# Patient Record
Sex: Male | Born: 1965
Health system: Southern US, Community
[De-identification: ages and names within clinical notes are randomized; demographics above are authoritative.]

## PROBLEM LIST (undated history)

## (undated) DIAGNOSIS — F419 Anxiety disorder, unspecified: Secondary | ICD-10-CM

## (undated) DIAGNOSIS — K219 Gastro-esophageal reflux disease without esophagitis: Secondary | ICD-10-CM

## (undated) DIAGNOSIS — J40 Bronchitis, not specified as acute or chronic: Secondary | ICD-10-CM

## (undated) DIAGNOSIS — M1990 Unspecified osteoarthritis, unspecified site: Secondary | ICD-10-CM

## (undated) DIAGNOSIS — R7989 Other specified abnormal findings of blood chemistry: Secondary | ICD-10-CM

## (undated) DIAGNOSIS — G4700 Insomnia, unspecified: Secondary | ICD-10-CM

## (undated) DIAGNOSIS — I10 Essential (primary) hypertension: Secondary | ICD-10-CM

## (undated) HISTORY — DX: Unspecified osteoarthritis, unspecified site: M19.90

## (undated) HISTORY — DX: Gastro-esophageal reflux disease without esophagitis: K21.9

## (undated) HISTORY — DX: Essential (primary) hypertension: I10

## (undated) HISTORY — PX: WRIST SURGERY: SHX841

## (undated) HISTORY — DX: Insomnia, unspecified: G47.00

## (undated) HISTORY — PX: NASAL SINUS SURGERY: SHX719

## (undated) HISTORY — PX: OTHER SURGICAL HISTORY: SHX169

## (undated) HISTORY — DX: Anxiety disorder, unspecified: F41.9

## (undated) HISTORY — DX: Other specified abnormal findings of blood chemistry: R79.89

## (undated) HISTORY — DX: Bronchitis, not specified as acute or chronic: J40

---

## 2004-01-08 ENCOUNTER — Emergency Department: Payer: Self-pay | Admitting: General Practice

## 2004-09-08 ENCOUNTER — Emergency Department: Payer: Self-pay | Admitting: General Practice

## 2008-07-07 ENCOUNTER — Ambulatory Visit: Payer: Self-pay | Admitting: Oncology

## 2009-08-16 ENCOUNTER — Ambulatory Visit: Payer: Self-pay | Admitting: Otolaryngology

## 2011-09-24 ENCOUNTER — Encounter: Payer: Self-pay | Admitting: Internal Medicine

## 2011-09-24 ENCOUNTER — Ambulatory Visit (INDEPENDENT_AMBULATORY_CARE_PROVIDER_SITE_OTHER): Payer: PRIVATE HEALTH INSURANCE | Admitting: Internal Medicine

## 2011-09-24 ENCOUNTER — Ambulatory Visit: Payer: Self-pay | Admitting: Internal Medicine

## 2011-09-24 ENCOUNTER — Ambulatory Visit (INDEPENDENT_AMBULATORY_CARE_PROVIDER_SITE_OTHER)
Admission: RE | Admit: 2011-09-24 | Discharge: 2011-09-24 | Disposition: A | Discharge status: Home / Self Care | Payer: PRIVATE HEALTH INSURANCE | Source: Ambulatory Visit | Attending: Internal Medicine | Admitting: Internal Medicine

## 2011-09-24 ENCOUNTER — Ambulatory Visit: Payer: Self-pay

## 2011-09-24 ENCOUNTER — Ambulatory Visit: Admission: RE | Admit: 2011-09-24 | Payer: Self-pay | Source: Ambulatory Visit

## 2011-09-24 ENCOUNTER — Telehealth: Payer: Self-pay | Admitting: *Deleted

## 2011-09-24 VITALS — BP 124/80 | HR 68

## 2011-09-24 VITALS — BP 124/80 | HR 68 | Temp 98.6°F | Resp 16 | Ht 71.0 in | Wt 200.5 lb

## 2011-09-24 DIAGNOSIS — M25559 Pain in unspecified hip: Secondary | ICD-10-CM

## 2011-09-24 DIAGNOSIS — M7712 Lateral epicondylitis, left elbow: Secondary | ICD-10-CM | POA: Insufficient documentation

## 2011-09-24 DIAGNOSIS — M25551 Pain in right hip: Secondary | ICD-10-CM

## 2011-09-24 DIAGNOSIS — F411 Generalized anxiety disorder: Secondary | ICD-10-CM

## 2011-09-24 DIAGNOSIS — M545 Low back pain, unspecified: Secondary | ICD-10-CM

## 2011-09-24 DIAGNOSIS — K219 Gastro-esophageal reflux disease without esophagitis: Secondary | ICD-10-CM | POA: Insufficient documentation

## 2011-09-24 DIAGNOSIS — N529 Male erectile dysfunction, unspecified: Secondary | ICD-10-CM | POA: Insufficient documentation

## 2011-09-24 DIAGNOSIS — F419 Anxiety disorder, unspecified: Secondary | ICD-10-CM | POA: Insufficient documentation

## 2011-09-24 DIAGNOSIS — I10 Essential (primary) hypertension: Secondary | ICD-10-CM | POA: Insufficient documentation

## 2011-09-24 MED ORDER — PREDNISONE (PAK) 10 MG PO TABS: ORAL_TABLET | ORAL | Status: DC

## 2011-09-24 NOTE — Progress Notes (Signed)
Subjective:    Patient ID: Gregory Bame Sr., male    DOB: January 07, 1966, 46 y.o.   MRN: [id number]  HPI 46 year old male with history of hypertension, GERD, and anxiety presents to establish care. His primary concern today is several week history of right lower back pain. The pain is located in his right medial lower back. It does not radiate. It is described as aching. It is most prominent at night and keeps him from sleeping. He has been taking ibuprofen 800 mg daily with some improvement. He notes that symptoms first began when he was seated in a new chair in his vehicle for work. He has since been getting a new chair but has not yet noted any improvement. He was seen by a chiropractor and underwent treatments with no improvement. He denies any weakness in his leg or numbness in his leg.  He is also concerned today about pain in his left lateral elbow. He reports that this pain occurred after repetitive motion at work. He has some improvement with use of ibuprofen. The pain is described as aching it does not radiate. There is no swelling in his joints or weakness in his left arm.  In regards to history of hypertension and GERD, he reports symptoms are well controlled with medication. He reports full compliance with his medicines. In regards to her anxiety, he reports that he uses alprazolam as needed at bedtime to help with sleep and anxiety. He denies any side effects from this medication.  Outpatient Encounter Prescriptions as of 09/24/2011  Medication Sig Dispense Refill  . ALPRAZolam (XANAX) 0.25 MG tablet Take 0.25 mg by mouth at bedtime as needed.      Marland Kitchen aspirin 81 MG tablet Take 81 mg by mouth daily.      Marland Kitchen lisinopril (PRINIVIL,ZESTRIL) 5 MG tablet Take 5 mg by mouth daily.      . Multiple Vitamin (MULTIVITAMIN) tablet Take 1 tablet by mouth daily.      Marland Kitchen omeprazole (PRILOSEC) 40 MG capsule Take 40 mg by mouth daily.      . predniSONE (STERAPRED UNI-PAK) 10 MG tablet Take 60mg  day 1 then  taper by 10mg  daily  21 tablet  0  . sildenafil (VIAGRA) 100 MG tablet Take 50 mg by mouth daily as needed.      . testosterone cypionate (DEPOTESTOTERONE CYPIONATE) 100 MG/ML injection Inject 100 mg into the muscle every 14 (fourteen) days. For IM use only       There were no vitals taken for this visit.  Review of Systems  Constitutional: Negative for fever, chills, activity change, appetite change, fatigue and unexpected weight change.  Eyes: Negative for visual disturbance.  Respiratory: Negative for cough and shortness of breath.   Cardiovascular: Negative for chest pain, palpitations and leg swelling.  Gastrointestinal: Negative for abdominal pain and abdominal distention.  Genitourinary: Negative for dysuria, urgency and difficulty urinating.  Musculoskeletal: Positive for myalgias, back pain and arthralgias. Negative for gait problem.  Skin: Negative for color change and rash.  Hematological: Negative for adenopathy.  Psychiatric/Behavioral: Positive for disturbed wake/sleep cycle. Negative for dysphoric mood. The patient is nervous/anxious.        Objective:   Physical Exam  Constitutional: He is oriented to person, place, and time. He appears well-developed and well-nourished. No distress.  HENT:  Head: Normocephalic and atraumatic.  Right Ear: External ear normal.  Left Ear: External ear normal.  Nose: Nose normal.  Mouth/Throat: Oropharynx is clear and moist. No oropharyngeal exudate.  Eyes: Conjunctivae normal and EOM are normal. Pupils are equal, round, and reactive to light. Right eye exhibits no discharge. Left eye exhibits no discharge. No scleral icterus.  Neck: Normal range of motion. Neck supple. No tracheal deviation present. No thyromegaly present.  Cardiovascular: Normal rate, regular rhythm and normal heart sounds.  Exam reveals no gallop and no friction rub.   No murmur heard. Pulmonary/Chest: Effort normal and breath sounds normal. No respiratory distress.  He has no wheezes. He has no rales. He exhibits no tenderness.  Musculoskeletal: Normal range of motion. He exhibits no edema.       Left elbow: He exhibits normal range of motion, no swelling, no effusion and no deformity. tenderness found. Lateral epicondyle tenderness noted.       Lumbar back: He exhibits tenderness and pain. He exhibits no swelling, no edema and no deformity.       Back:  Lymphadenopathy:    He has no cervical adenopathy.  Neurological: He is alert and oriented to person, place, and time. No cranial nerve deficit. Coordination normal.  Skin: Skin is warm and dry. No rash noted. He is not diaphoretic. No erythema. No pallor.  Psychiatric: He has a normal mood and affect. His behavior is normal. Judgment and thought content normal.          Assessment & Plan:

## 2011-09-24 NOTE — Assessment & Plan Note (Signed)
Symptoms well controlled with Xanax as needed. Will continue.

## 2011-09-24 NOTE — Assessment & Plan Note (Signed)
Symptoms and exam most consistent with compression of the sciatic nerve. Will get plain xray of right hip and lumbar spine given potential trauma with seat at work. Will start prednisone taper. If no improvement, would favor referral to sports med for steroid injection and PT. Follow up 3 weeks.

## 2011-09-24 NOTE — Telephone Encounter (Signed)
Pt informed, understood & agreed; Ok to set up MRI/SLS

## 2011-09-24 NOTE — Telephone Encounter (Signed)
Message copied by Regis Bill on Tue Sep 24, 2011  3:10 PM ------      Message from: Ronna Polio A      Created: Tue Sep 24, 2011 12:07 PM       Xray of lumbar spine showed degenerative changes at L4-5 right>left. I would recommend getting MRI lumbar spine for further evaluation.

## 2011-09-24 NOTE — Assessment & Plan Note (Signed)
Symptoms well controlled with viagra. Will continue.

## 2011-09-24 NOTE — Assessment & Plan Note (Signed)
Symptoms and exam consistent with left lateral epicondylitis. Will treat with prednisone taper. If no improvement, would favor referral to sports medicine for evaluation and possible steroid injection.

## 2011-09-24 NOTE — Assessment & Plan Note (Signed)
Symptoms well controlled with omeprazole. Will continue. 

## 2011-09-24 NOTE — Assessment & Plan Note (Signed)
BP well controlled on Lisinopril. Will continue. Will get records on recent renal function from labs drawn by pt employer.

## 2011-09-25 NOTE — Progress Notes (Signed)
  Subjective:    Patient ID: Gregory Bame Sr., male    DOB: March 05, 1965, 46 y.o.   MRN: [id number]  HPI Visit entered twice    Review of Systems     Objective:   Physical Exam        Assessment & Plan:

## 2011-09-26 ENCOUNTER — Encounter: Payer: Self-pay | Admitting: Internal Medicine

## 2011-10-09 ENCOUNTER — Telehealth: Payer: Self-pay | Admitting: Internal Medicine

## 2011-10-09 NOTE — Telephone Encounter (Signed)
Pt called checking to see when his mri will be set up Pt would like to go to [place]

## 2011-10-11 NOTE — Telephone Encounter (Signed)
I will have Erie Noe call patient to set up MRI.

## 2011-10-21 ENCOUNTER — Telehealth: Payer: Self-pay | Admitting: Internal Medicine

## 2011-10-21 NOTE — Telephone Encounter (Signed)
Caller: Burnis/Patient; Phone: [phone]; Reason for Call: Patient calling to verify if insurance company has responded regarding approval for MRI.

## 2011-10-23 NOTE — Telephone Encounter (Signed)
Caller: [name]/Patient; Patient Name: Gregory Stokes; PCP: Ronna Polio (Adults only); Best Callback Phone Number: [phone]. Patient is calling to check on his referral for the MRI that Dr. Dan Humphreys was supposed to have scheduled. He has spoken with several staff members regarding this and has not received phone calls back as requested. Reviewed referral information in EPIC and the referral for MRI is still pending. Patient reports that he will check with his insurance company to try and get this referral approved. Patient reports his job makes it difficult to schedule appointment at times. Patient reports he will keep his current appointment with Dr. Dan Humphreys which is scheduled for 10/24/11 at 8:45am. PLEASE CALL PATIENT BACK REGARDING THE REFERRAL FOR SCHEDULING AN MRI. Thank you.

## 2011-10-24 ENCOUNTER — Ambulatory Visit (INDEPENDENT_AMBULATORY_CARE_PROVIDER_SITE_OTHER): Payer: PRIVATE HEALTH INSURANCE | Admitting: Internal Medicine

## 2011-10-24 ENCOUNTER — Encounter: Payer: Self-pay | Admitting: Internal Medicine

## 2011-10-24 VITALS — BP 120/80 | HR 68 | Temp 98.7°F | Ht 71.0 in | Wt 196.5 lb

## 2011-10-24 DIAGNOSIS — I10 Essential (primary) hypertension: Secondary | ICD-10-CM

## 2011-10-24 DIAGNOSIS — K219 Gastro-esophageal reflux disease without esophagitis: Secondary | ICD-10-CM

## 2011-10-24 DIAGNOSIS — M25551 Pain in right hip: Secondary | ICD-10-CM

## 2011-10-24 DIAGNOSIS — E291 Testicular hypofunction: Secondary | ICD-10-CM

## 2011-10-24 DIAGNOSIS — M25559 Pain in unspecified hip: Secondary | ICD-10-CM

## 2011-10-24 MED ORDER — MELOXICAM 15 MG PO TABS: 15.0000 mg | ORAL_TABLET | Freq: Every day | ORAL | Status: DC

## 2011-10-24 NOTE — Addendum Note (Signed)
Addended by: Ronna Polio A on: 10/24/2011 01:21 PM   Modules accepted: Orders

## 2011-10-24 NOTE — Assessment & Plan Note (Signed)
Symptoms are persistent. Patient has been using ibuprofen with minimal improvement. Will try changing to meloxicam. MRI of the lumbar spine is pending. Followup here in 4 weeks or sooner as needed.

## 2011-10-24 NOTE — Assessment & Plan Note (Signed)
Blood pressures have been running low on lisinopril. Patient would like to give a try off this medication. He will stop medication for 2 days and monitor blood pressure carefully. Blood pressure continues to be well-controlled he will remain off the medication. He will e-mail or call if blood pressure readings.

## 2011-10-24 NOTE — Progress Notes (Signed)
Subjective:    Patient ID: Gregory Bame Sr., male    DOB: July 13, 1965, 46 y.o.   MRN: [id number]  HPI 46 year old male with history of hypertension, GERD, and right lower back pain presents for followup. At his last visit, he had plain x-rays of the lumbar spine which showed asymmetry of the facets at L4 and L5. He was started on prednisone taper and reports that symptoms of low back pain briefly improved with this. However, symptoms have recurred. He has aching, severe low back pain located in the right lower back and hip area on a daily basis. He has difficulty with movement because of pain. He has been taking ibuprofen in the mornings with minimal improvement in his symptoms.  In regards to GERD, he reports that over the last few weeks he has had a recent exacerbation of his symptoms with increased burning in his upper epigastric area and sore throat. He reports that this seems to be improving over the last couple of days. He reports full compliance with his omeprazole. He does typically eat foods which are spicy.  In regards to hypertension, he reports that he would like to stop lisinopril. He reports blood pressure has been well-controlled and he would like a trial off this medication.  Outpatient Encounter Prescriptions as of 10/24/2011  Medication Sig Dispense Refill  . ALPRAZolam (XANAX) 0.25 MG tablet Take 0.25 mg by mouth at bedtime as needed.      Marland Kitchen aspirin 81 MG tablet Take 81 mg by mouth daily.      Marland Kitchen lisinopril (PRINIVIL,ZESTRIL) 5 MG tablet Take 5 mg by mouth daily.      . Multiple Vitamin (MULTIVITAMIN) tablet Take 1 tablet by mouth daily.      Marland Kitchen omeprazole (PRILOSEC) 40 MG capsule Take 40 mg by mouth daily.      Marland Kitchen testosterone cypionate (DEPOTESTOTERONE CYPIONATE) 100 MG/ML injection Inject 100 mg into the muscle every 14 (fourteen) days. For IM use only      . DISCONTD: predniSONE (STERAPRED UNI-PAK) 10 MG tablet Take 60mg  day 1 then taper by 10mg  daily  21 tablet  0  .  meloxicam (MOBIC) 15 MG tablet Take 1 tablet (15 mg total) by mouth daily.  30 tablet  6  . sildenafil (VIAGRA) 100 MG tablet Take 50 mg by mouth daily as needed.       BP 120/80  Pulse 68  Temp 98.7 F (37.1 C) (Oral)  Ht 5\' 11"  (1.803 m)  Wt 196 lb 8 oz (89.132 kg)  BMI 27.41 kg/m2  SpO2 98%  Review of Systems  Constitutional: Negative for fever, chills, activity change, appetite change, fatigue and unexpected weight change.  Eyes: Negative for visual disturbance.  Respiratory: Negative for cough and shortness of breath.   Cardiovascular: Negative for chest pain, palpitations and leg swelling.  Gastrointestinal: Positive for abdominal pain. Negative for abdominal distention.  Genitourinary: Negative for dysuria, urgency and difficulty urinating.  Musculoskeletal: Positive for myalgias and arthralgias. Negative for gait problem.  Skin: Negative for color change and rash.  Hematological: Negative for adenopathy.  Psychiatric/Behavioral: Negative for disturbed wake/sleep cycle and dysphoric mood. The patient is not nervous/anxious.        Objective:   Physical Exam  Constitutional: He is oriented to person, place, and time. He appears well-developed and well-nourished. No distress.  HENT:  Head: Normocephalic and atraumatic.  Right Ear: External ear normal.  Left Ear: External ear normal.  Nose: Nose normal.  Mouth/Throat: Oropharynx is  clear and moist. No oropharyngeal exudate.  Eyes: Conjunctivae normal and EOM are normal. Pupils are equal, round, and reactive to light. Right eye exhibits no discharge. Left eye exhibits no discharge. No scleral icterus.  Neck: Normal range of motion. Neck supple. No tracheal deviation present. No thyromegaly present.  Cardiovascular: Normal rate, regular rhythm and normal heart sounds.  Exam reveals no gallop and no friction rub.   No murmur heard. Pulmonary/Chest: Effort normal and breath sounds normal. No respiratory distress. He has no  wheezes. He has no rales. He exhibits no tenderness.  Musculoskeletal: He exhibits no edema.       Lumbar back: He exhibits decreased range of motion and pain.  Lymphadenopathy:    He has no cervical adenopathy.  Neurological: He is alert and oriented to person, place, and time. No cranial nerve deficit. Coordination normal.  Skin: Skin is warm and dry. No rash noted. He is not diaphoretic. No erythema. No pallor.  Psychiatric: He has a normal mood and affect. His behavior is normal. Judgment and thought content normal.          Assessment & Plan:

## 2011-10-24 NOTE — Assessment & Plan Note (Signed)
Patient has been on testosterone supplementation through his urologist. He reports no improvement in symptoms of fatigue or low libido with use of testosterone supplementation. He would like to stop supplementation. He will discuss with his urologist.

## 2011-10-24 NOTE — Assessment & Plan Note (Signed)
Recent episode of worsening symptoms. Per patient, seems to be improving. Will try changing to Dexilant for the next 2 weeks to see if any benefit. If symptoms are persisting, would favor getting testing for H. pylori. Followup 4 weeks.

## 2011-10-25 ENCOUNTER — Encounter: Payer: Self-pay | Admitting: Internal Medicine

## 2011-12-10 ENCOUNTER — Telehealth: Payer: Self-pay | Admitting: Internal Medicine

## 2011-12-10 NOTE — Telephone Encounter (Signed)
Erie Noe, I do believe that you were working on this a while back, can you please help Dorene Sorrow. Thanks

## 2011-12-10 NOTE — Telephone Encounter (Signed)
[facility] [phone]) is calling about Auth for MRI. They were trying to get authorization but [employer] is saying that we already have an authorization for the procedure. I did not see an auth number in the referral. [employer] will not give them the number but wanted [place] to call and get the auth number.  The Reference number for the MRI is [id number].

## 2011-12-11 ENCOUNTER — Telehealth: Payer: Self-pay | Admitting: Internal Medicine

## 2011-12-11 NOTE — Telephone Encounter (Signed)
Mr Boerner call wanting to speak with the office manager.  He would not give any information about what it was regarding

## 2011-12-11 NOTE — Telephone Encounter (Signed)
I spoke with Gregory Stokes, he is upset about his referral's. He says that he was told by Dr. Dan Humphreys in October that she would call his insurance company and get the authorization for his MRI. He also states that his insurance company told him that we had the authorization for his MRI back in October. I explained to the patient that they didn't share that with [place] until yesterday and they stated that it was valid for one day.  He was also upset stating that no one ever called him back when he has left message on Nikki's voice mail and also was told by staff member yesterday that they were waiting on dr. Dan Humphreys to come out of the room and they would call him back after talking with her. I apologized to the patient and advised him that I would talk with staff memebers.

## 2011-12-18 ENCOUNTER — Ambulatory Visit: Payer: Self-pay | Admitting: Internal Medicine

## 2011-12-18 ENCOUNTER — Telehealth: Payer: Self-pay | Admitting: Internal Medicine

## 2011-12-18 NOTE — Telephone Encounter (Signed)
MRI Lumbar spine, diffuse degenerative disc disease.

## 2011-12-24 ENCOUNTER — Ambulatory Visit: Payer: Self-pay | Admitting: Pain Medicine

## 2011-12-31 ENCOUNTER — Encounter: Payer: Self-pay | Admitting: Internal Medicine

## 2012-01-02 ENCOUNTER — Telehealth: Payer: Self-pay | Admitting: Internal Medicine

## 2012-01-02 NOTE — Telephone Encounter (Signed)
Opened in error

## 2012-01-22 ENCOUNTER — Encounter: Payer: Self-pay | Admitting: Internal Medicine

## 2012-01-22 ENCOUNTER — Other Ambulatory Visit: Payer: Self-pay | Admitting: *Deleted

## 2012-01-22 MED ORDER — ALPRAZOLAM 0.25 MG PO TABS: 0.2500 mg | ORAL_TABLET | Freq: Every evening | ORAL | Status: DC | PRN

## 2012-01-22 NOTE — Telephone Encounter (Signed)
Rx called to [facility].

## 2012-01-27 ENCOUNTER — Encounter: Payer: Self-pay | Admitting: Internal Medicine

## 2012-01-27 ENCOUNTER — Telehealth: Payer: Self-pay | Admitting: General Practice

## 2012-01-27 ENCOUNTER — Ambulatory Visit: Payer: Self-pay | Admitting: Pain Medicine

## 2012-01-27 MED ORDER — ALPRAZOLAM 0.25 MG PO TABS: 0.2500 mg | ORAL_TABLET | Freq: Every evening | ORAL | Status: DC | PRN

## 2012-01-27 NOTE — Telephone Encounter (Signed)
Pt called stating he has been trying to get in touch with you regarding his XANAX refill. Has sent MyChart messages. Please advise if it is ok to refill?

## 2012-01-27 NOTE — Telephone Encounter (Signed)
Refill completed.

## 2012-01-29 ENCOUNTER — Telehealth: Payer: Self-pay | Admitting: Internal Medicine

## 2012-01-29 MED ORDER — ALPRAZOLAM 0.5 MG PO TABS: 0.5000 mg | ORAL_TABLET | Freq: Three times a day (TID) | ORAL | Status: DC | PRN

## 2012-01-29 NOTE — Telephone Encounter (Signed)
Med filled.  

## 2012-01-29 NOTE — Telephone Encounter (Signed)
Pt states that he is having severe panic attacks and anxiety. Last Rx had been written by Dr. Vear Clock, for the Xanax "take 1-2 tablets per day and 1-2 tablets per night as needed". Pt very upset that the last Rx that had been filled was only for 1 tablet per day Qty 30 with refills. Please advise if pt can get back to original dose.   Pt also advises that he has had an elevated white blood cell count since September. What should be done with this?    Please leave message on the number listed below.

## 2012-01-29 NOTE — Telephone Encounter (Signed)
This was how it had been recorded in his chart when he first came to our clinic. So, I had just approved a refill. We can call in Alprazolam 0.5mg  po tid prn #90 with 1 refill

## 2012-01-29 NOTE — Telephone Encounter (Signed)
Pt called 1/22, states he has two questions and needs someone to call him back.

## 2012-01-30 ENCOUNTER — Encounter: Payer: Self-pay | Admitting: Internal Medicine

## 2012-02-22 ENCOUNTER — Other Ambulatory Visit: Payer: Self-pay

## 2012-03-09 ENCOUNTER — Telehealth: Payer: Self-pay | Admitting: Internal Medicine

## 2012-03-09 ENCOUNTER — Telehealth: Payer: Self-pay | Admitting: *Deleted

## 2012-03-09 NOTE — Telephone Encounter (Signed)
Patient left message on voicemail stating he had some repeat labs done at [facility] and he was calling to see if we had gotten the results. His WBC was high last time so he had to repeat it, it need to be down so he can have his epidural done.

## 2012-03-09 NOTE — Telephone Encounter (Signed)
Yes, I sent him a MyChart message earlier. WBC was 11.1. This is just above the normal range. They may require him to have a hematology evaluation prior to epidural.

## 2012-03-09 NOTE — Telephone Encounter (Signed)
Patient informed and verbally agreed.  

## 2012-03-09 NOTE — Telephone Encounter (Signed)
WBC 11.1

## 2012-03-17 ENCOUNTER — Ambulatory Visit: Payer: Self-pay | Admitting: Pain Medicine

## 2012-04-01 ENCOUNTER — Ambulatory Visit: Payer: Self-pay | Admitting: Pain Medicine

## 2012-04-07 ENCOUNTER — Ambulatory Visit: Payer: Self-pay | Admitting: Pain Medicine

## 2012-04-22 ENCOUNTER — Ambulatory Visit: Payer: Self-pay | Admitting: Pain Medicine

## 2012-05-08 ENCOUNTER — Other Ambulatory Visit: Payer: Self-pay | Admitting: Internal Medicine

## 2012-05-08 NOTE — Telephone Encounter (Signed)
Please Advise...Marland KitchenMarland KitchenMarland Kitchen  Meloxicam (Mobic) 15mg  tablet #[id number]  Patient was last seen on 10/24/2011

## 2012-05-11 ENCOUNTER — Ambulatory Visit: Payer: Self-pay | Admitting: Pain Medicine

## 2012-06-11 ENCOUNTER — Telehealth: Payer: Self-pay | Admitting: Internal Medicine

## 2012-06-11 NOTE — Telephone Encounter (Signed)
Labs showed normal kidney and liver function. Ca was elevated at 10.3 Cholesterol showed elevated triglycerides at 200 Please make sure that labs are scanned and pt has follow up

## 2012-06-11 NOTE — Telephone Encounter (Signed)
Left message to call back  

## 2012-06-12 NOTE — Telephone Encounter (Signed)
Spoke with patient, informed him of results. He stated his WBC is what he was concerned about because it has been high the last couple times. Also the doctor at the pain clinic noticed his Ca was low and started him on a medication but he is not sure what it is. But he thinks it was for his Ca because the BP medication was causing it to be low.

## 2012-06-12 NOTE — Telephone Encounter (Signed)
Needs follow up and we need scanned labs.

## 2012-06-15 NOTE — Telephone Encounter (Signed)
Called and spoke with patient, he stated he gave me the wrong information last week. He thought it was Ca but now he remembers it was his Magnesium the pain doctor told him was low. He does have a follow up appointment schedule for July 29th

## 2012-08-04 ENCOUNTER — Encounter: Payer: Self-pay | Admitting: Internal Medicine

## 2012-08-04 ENCOUNTER — Ambulatory Visit (INDEPENDENT_AMBULATORY_CARE_PROVIDER_SITE_OTHER): Payer: BC Managed Care – PPO | Admitting: Internal Medicine

## 2012-08-04 VITALS — BP 134/98 | HR 70 | Temp 98.0°F | Wt 197.0 lb

## 2012-08-04 DIAGNOSIS — E291 Testicular hypofunction: Secondary | ICD-10-CM

## 2012-08-04 DIAGNOSIS — D72829 Elevated white blood cell count, unspecified: Secondary | ICD-10-CM | POA: Insufficient documentation

## 2012-08-04 DIAGNOSIS — F411 Generalized anxiety disorder: Secondary | ICD-10-CM

## 2012-08-04 DIAGNOSIS — I10 Essential (primary) hypertension: Secondary | ICD-10-CM

## 2012-08-04 LAB — CBC WITH DIFFERENTIAL/PLATELET
Eosinophils Relative: 4.5 % (ref 0.0–5.0)
HCT: 47.8 % (ref 39.0–52.0)
Hemoglobin: 16.7 g/dL (ref 13.0–17.0)
Lymphs Abs: 2.4 10*3/uL (ref 0.7–4.0)
Monocytes Relative: 6 % (ref 3.0–12.0)
Neutro Abs: 6.2 10*3/uL (ref 1.4–7.7)
RBC: 5.35 Mil/uL (ref 4.22–5.81)
WBC: 9.7 10*3/uL (ref 4.5–10.5)

## 2012-08-04 LAB — COMPREHENSIVE METABOLIC PANEL
AST: 27 U/L (ref 0–37)
Alkaline Phosphatase: 62 U/L (ref 39–117)
BUN: 14 mg/dL (ref 6–23)
Creatinine, Ser: 1 mg/dL (ref 0.4–1.5)
Potassium: 4.3 mEq/L (ref 3.5–5.1)
Total Bilirubin: 0.4 mg/dL (ref 0.3–1.2)

## 2012-08-04 MED ORDER — ALPRAZOLAM 0.5 MG PO TABS: 0.5000 mg | ORAL_TABLET | Freq: Three times a day (TID) | ORAL | Status: DC | PRN

## 2012-08-04 NOTE — Assessment & Plan Note (Signed)
Mild persistent leukocytosis noted on previous labs with white blood cell count between 10 and 12. No signs or symptoms to suggest infection. Will recheck CBC with labs today.

## 2012-08-04 NOTE — Assessment & Plan Note (Signed)
Mild hypercalcemia noted on recent labs. Pt consumes large amounts of Tums. Question if this may be contributing. Will check Ca and PTH with labs.

## 2012-08-04 NOTE — Assessment & Plan Note (Signed)
Symptoms well controlled with use of alprazolam. Discussed potential risk of this medication including dependence. Will continue to use as needed.

## 2012-08-04 NOTE — Progress Notes (Signed)
Subjective:    Patient ID: Gregory Bame Sr., male    DOB: Aug 17, 1965, 47 y.o.   MRN: [id number]  HPI 47 year old male with history of anxiety, hypertension, chronic back pain, hypogonadism presents for followup. He reports he is generally feeling well. He notes some mild fatigue. He was concerned about potential side effects of testosterone supplementation and stopped his testosterone injections. He denies any noted changes in energy level after stopping this medication. He reports sexual function is normal.  He is concerned about chronic elevation of white blood cell count on labs. On several occasions white blood cell count has been noted to be between 10 and 12. He denies any recent infections. He denies any symptoms to suggest infection such as fever, chills, cough, upper respiratory symptoms, GI symptoms. He is generally feeling well. He reports normal appetite and energy level. No enlarged lymph nodes.  In regards to anxiety, he reports symptoms are fairly well-controlled with use of intermittent alprazolam. He typically takes this for couple of times during the day.  Outpatient Encounter Prescriptions as of 08/04/2012  Medication Sig Dispense Refill  . ALPRAZolam (XANAX) 0.5 MG tablet Take 1 tablet (0.5 mg total) by mouth 3 (three) times daily as needed for sleep or anxiety.  90 tablet  3  . aspirin 81 MG tablet Take 81 mg by mouth daily.      Marland Kitchen gabapentin (NEURONTIN) 300 MG capsule Take 300 mg by mouth 3 (three) times daily.      Marland Kitchen lisinopril (PRINIVIL,ZESTRIL) 5 MG tablet Take 5 mg by mouth daily.      . magnesium oxide (MAG-OX) 400 MG tablet Take 400 mg by mouth daily.      . meloxicam (MOBIC) 15 MG tablet TAKE 1 TABLET BY MOUTH EVERY DAY  30 tablet  6  . methocarbamol (ROBAXIN) 750 MG tablet Take 750 mg by mouth 4 (four) times daily.      . Multiple Vitamin (MULTIVITAMIN) tablet Take 1 tablet by mouth daily.      Marland Kitchen omeprazole (PRILOSEC) 40 MG capsule Take 40 mg by mouth daily.      .  sildenafil (VIAGRA) 100 MG tablet Take 50 mg by mouth daily as needed.      . [DISCONTINUED] ALPRAZolam (XANAX) 0.5 MG tablet Take 1 tablet (0.5 mg total) by mouth 3 (three) times daily as needed for sleep.  90 tablet  1  . testosterone cypionate (DEPOTESTOTERONE CYPIONATE) 100 MG/ML injection Inject 100 mg into the muscle every 14 (fourteen) days. For IM use only       No facility-administered encounter medications on file as of 08/04/2012.   BP 134/98  Pulse 70  Temp(Src) 98 F (36.7 C) (Oral)  Wt 197 lb (89.359 kg)  BMI 27.49 kg/m2  SpO2 97%  Review of Systems  Constitutional: Positive for fatigue. Negative for fever, chills, activity change, appetite change and unexpected weight change.  Eyes: Negative for visual disturbance.  Respiratory: Negative for cough and shortness of breath.   Cardiovascular: Negative for chest pain, palpitations and leg swelling.  Gastrointestinal: Negative for abdominal pain and abdominal distention.  Genitourinary: Negative for dysuria, urgency and difficulty urinating.  Musculoskeletal: Negative for arthralgias and gait problem.  Skin: Negative for color change and rash.  Hematological: Negative for adenopathy.  Psychiatric/Behavioral: Negative for sleep disturbance and dysphoric mood. The patient is not nervous/anxious.        Objective:   Physical Exam  Constitutional: He is oriented to person, place, and time. He  appears well-developed and well-nourished. No distress.  HENT:  Head: Normocephalic and atraumatic.  Right Ear: External ear normal.  Left Ear: External ear normal.  Nose: Nose normal.  Mouth/Throat: Oropharynx is clear and moist. No oropharyngeal exudate.  Eyes: Conjunctivae and EOM are normal. Pupils are equal, round, and reactive to light. Right eye exhibits no discharge. Left eye exhibits no discharge. No scleral icterus.  Neck: Normal range of motion. Neck supple. No tracheal deviation present. No thyromegaly present.   Cardiovascular: Normal rate, regular rhythm and normal heart sounds.  Exam reveals no gallop and no friction rub.   No murmur heard. Pulmonary/Chest: Effort normal and breath sounds normal. No respiratory distress. He has no wheezes. He has no rales. He exhibits no tenderness.  Musculoskeletal: Normal range of motion. He exhibits no edema.  Lymphadenopathy:    He has no cervical adenopathy.  Neurological: He is alert and oriented to person, place, and time. No cranial nerve deficit. Coordination normal.  Skin: Skin is warm and dry. No rash noted. He is not diaphoretic. No erythema. No pallor.  Psychiatric: He has a normal mood and affect. His behavior is normal. Judgment and thought content normal.          Assessment & Plan:

## 2012-08-04 NOTE — Assessment & Plan Note (Signed)
Patient is followed by urology and on testosterone supplementation. He has not recently taken a testosterone injection. Discussed potential risk of testosterone supplementation including increased risk of heart attack and stroke. Will check testosterone level with labs today.

## 2012-08-04 NOTE — Assessment & Plan Note (Signed)
BP Readings from Last 3 Encounters:  08/04/12 134/98  10/24/11 120/80  09/24/11 124/80   Blood pressure slightly elevated today however patient has not been taking lisinopril. Encouraged better compliance with medication. Will check renal function with labs today.

## 2012-08-05 LAB — TESTOSTERONE, FREE, TOTAL, SHBG
Testosterone, Free: 67.8 pg/mL (ref 47.0–244.0)
Testosterone-% Free: 2.2 % (ref 1.6–2.9)
Testosterone: 313 ng/dL (ref 300–890)

## 2012-08-05 LAB — PTH, INTACT AND CALCIUM: Calcium: 10 mg/dL (ref 8.4–10.5)

## 2012-08-10 ENCOUNTER — Telehealth: Payer: Self-pay | Admitting: *Deleted

## 2012-08-10 NOTE — Telephone Encounter (Addendum)
Patient left a voicemail stating he was waiting on some more lab results. A message was sent to his Mychart and he just know that you told him you would call him.

## 2012-08-10 NOTE — Telephone Encounter (Signed)
Patient informed and verbalized understanding

## 2012-08-10 NOTE — Telephone Encounter (Signed)
All of his labs were completely normal including repeat calcium level and testosterone level. I would recommend that he continue off of the testosterone.  We can plan to repeat labs in 6 months. If he has any specific questions, the best way to reach me would be through MyChart.

## 2012-11-12 ENCOUNTER — Other Ambulatory Visit: Payer: Self-pay

## 2012-12-21 ENCOUNTER — Ambulatory Visit: Payer: Self-pay | Admitting: Pain Medicine

## 2013-02-08 ENCOUNTER — Ambulatory Visit: Payer: Self-pay | Admitting: Pain Medicine

## 2013-02-10 ENCOUNTER — Encounter: Payer: BC Managed Care – PPO | Admitting: Internal Medicine

## 2013-02-11 ENCOUNTER — Ambulatory Visit: Payer: Self-pay | Admitting: Pain Medicine

## 2013-02-13 ENCOUNTER — Other Ambulatory Visit: Payer: Self-pay | Admitting: Internal Medicine

## 2013-02-13 NOTE — Telephone Encounter (Signed)
Okay to refill? Last seen in July (missed last appt on 02/10/13 for physical)

## 2013-02-25 ENCOUNTER — Encounter: Payer: Self-pay | Admitting: Internal Medicine

## 2013-02-25 ENCOUNTER — Ambulatory Visit: Payer: Self-pay | Admitting: Pain Medicine

## 2013-03-10 ENCOUNTER — Ambulatory Visit: Payer: Self-pay | Admitting: Pain Medicine

## 2013-03-15 ENCOUNTER — Ambulatory Visit: Payer: Self-pay | Admitting: Pain Medicine

## 2013-03-16 ENCOUNTER — Ambulatory Visit: Payer: Self-pay | Admitting: Pain Medicine

## 2013-04-07 ENCOUNTER — Ambulatory Visit: Payer: Self-pay | Admitting: Pain Medicine

## 2013-04-14 ENCOUNTER — Ambulatory Visit (INDEPENDENT_AMBULATORY_CARE_PROVIDER_SITE_OTHER): Payer: BC Managed Care – PPO | Admitting: Internal Medicine

## 2013-04-14 ENCOUNTER — Encounter: Payer: Self-pay | Admitting: Internal Medicine

## 2013-04-14 ENCOUNTER — Telehealth: Payer: Self-pay | Admitting: Internal Medicine

## 2013-04-14 VITALS — BP 130/90 | HR 71 | Temp 97.6°F | Ht 71.0 in | Wt 192.5 lb

## 2013-04-14 DIAGNOSIS — M545 Low back pain, unspecified: Secondary | ICD-10-CM | POA: Insufficient documentation

## 2013-04-14 DIAGNOSIS — N529 Male erectile dysfunction, unspecified: Secondary | ICD-10-CM

## 2013-04-14 DIAGNOSIS — F411 Generalized anxiety disorder: Secondary | ICD-10-CM

## 2013-04-14 DIAGNOSIS — G8929 Other chronic pain: Secondary | ICD-10-CM

## 2013-04-14 DIAGNOSIS — E291 Testicular hypofunction: Secondary | ICD-10-CM

## 2013-04-14 DIAGNOSIS — I10 Essential (primary) hypertension: Secondary | ICD-10-CM

## 2013-04-14 DIAGNOSIS — L989 Disorder of the skin and subcutaneous tissue, unspecified: Secondary | ICD-10-CM | POA: Insufficient documentation

## 2013-04-14 MED ORDER — CLONAZEPAM 0.5 MG PO TABS: 0.5000 mg | ORAL_TABLET | Freq: Three times a day (TID) | ORAL | Status: DC | PRN

## 2013-04-14 NOTE — Assessment & Plan Note (Signed)
Will request recent labs including Testosterone levels from Urology.

## 2013-04-14 NOTE — Assessment & Plan Note (Signed)
Lesions over abdomen consistent with seborrheic keratoses. Discussed the benign nature of these lesions. Discussed cryotherapy. He will consider evaluation with dermatology in the future.

## 2013-04-14 NOTE — Assessment & Plan Note (Signed)
Symptoms recently worsening. Exacerbated by poor pain control. Will try changing from Alprazolam to Clonazepam with goal of longer acting control of anxiety. Follow up 4 weeks and prn. Discussed potential side effects of Clonazepam including sedation.

## 2013-04-14 NOTE — Assessment & Plan Note (Signed)
Started on new medication by Urology. Will request notes on this.

## 2013-04-14 NOTE — Progress Notes (Signed)
Pre-visit discussion using our clinic review tool. No additional management support is needed unless otherwise documented below in the visit note.  

## 2013-04-14 NOTE — Assessment & Plan Note (Signed)
Chronic low back pain secondary to DJD and spinal stenosis. Followed by [facility]. Will request recent MRIs. Pt requests second opinion from neurosurgery. Referral placed. Encouraged him to increase Neurontin to 300mg  po tid and use Oxycodone as prescribed by Dr. Renato Shin.

## 2013-04-14 NOTE — Patient Instructions (Addendum)
Increase Neurontin (Gabapentin) to 300mg  three times daily.  We will schedule an evaluation with neurosurgery.  Start Clonazepam 0.5mg  up to three times daily as needed for anxiety.Marland Kitchen

## 2013-04-14 NOTE — Progress Notes (Signed)
Subjective:    Patient ID: Gregory Drown Sr., male    DOB: November 11, 1965, 48 y.o.   MRN: [id number]  HPI 48YO male presents for follow up.  Chronic low back pain - spinal stenosis noted on recent MRI lumbar spine. Followed by Dr. Sundra Aland at [facility]. Describes "crippling" pain in lower back. Had trigger point injections and ESI. Plan for RFA. Had numbness in right leg after injections. Pain runs down right leg. Planning to increase the Gabapentin. Started on Oxycodone 5mg  prn by pain management. Currently taking a "drug vacation" and pain is severe. Feels like being "electrocuted" in right leg. No weakness noted in leg. Dr. Sundra Aland recommended to restart Oxycodone. 70% improvement in pain with Oxycodone. Would like to consider second opinion by neurosurgery.   Skin Lesion - Concerned about skin lesions over lower abdomen. Have been present "for years." Not sure if any recent changes. Not painful or itching.  Anxiety - Recently symptoms of anxiety have been worse. Taking Alprazolam with minimal improvement.  Hypogonadism - T -levels low per his report. Wife was injecting into fatty tissue. Has repeat levels scheduled. Continues to have fatigue and erectile dysfunction. Urologist started him on new med Sanora (sp?) for ED. Has some improvement with this.  HTN - only taking 1/2 of 5mg  lisinopril generally. BP at home <130/90. No chest pain, palpitations, headache.  Review of Systems  Constitutional: Negative for fever, chills, activity change, appetite change, fatigue and unexpected weight change.  Eyes: Negative for visual disturbance.  Respiratory: Negative for cough and shortness of breath.   Cardiovascular: Negative for chest pain, palpitations and leg swelling.  Gastrointestinal: Negative for abdominal pain and abdominal distention.  Genitourinary: Negative for dysuria, urgency and difficulty urinating.  Musculoskeletal: Positive for arthralgias, back pain and myalgias. Negative for gait  problem.  Skin: Negative for color change and rash.  Hematological: Negative for adenopathy.  Psychiatric/Behavioral: Positive for sleep disturbance. Negative for dysphoric mood. The patient is nervous/anxious.        Objective:    BP 130/90  Pulse 71  Temp(Src) 97.6 F (36.4 C) (Oral)  Ht 5\' 11"  (1.803 m)  Wt 192 lb 8 oz (87.317 kg)  BMI 26.86 kg/m2  SpO2 97% Physical Exam  Constitutional: He is oriented to person, place, and time. He appears well-developed and well-nourished. No distress.  HENT:  Head: Normocephalic and atraumatic.  Right Ear: External ear normal.  Left Ear: External ear normal.  Nose: Nose normal.  Mouth/Throat: Oropharynx is clear and moist. No oropharyngeal exudate.  Eyes: Conjunctivae and EOM are normal. Pupils are equal, round, and reactive to light. Right eye exhibits no discharge. Left eye exhibits no discharge. No scleral icterus.  Neck: Normal range of motion. Neck supple. No tracheal deviation present. No thyromegaly present.  Cardiovascular: Normal rate, regular rhythm and normal heart sounds.  Exam reveals no gallop and no friction rub.   No murmur heard. Pulmonary/Chest: Effort normal and breath sounds normal. No accessory muscle usage. Not tachypneic. No respiratory distress. He has no decreased breath sounds. He has no wheezes. He has no rhonchi. He has no rales. He exhibits no tenderness.  Musculoskeletal: Normal range of motion. He exhibits no edema.  Lymphadenopathy:    He has no cervical adenopathy.  Neurological: He is alert and oriented to person, place, and time. No cranial nerve deficit. Coordination normal.  Skin: Skin is warm and dry. Lesion (Brown, papules over abdomen, consistent with SK) noted. No rash noted. He is not diaphoretic.  No erythema. No pallor.  Psychiatric: His behavior is normal. Judgment and thought content normal. His mood appears anxious. His speech is rapid and/or pressured. Cognition and memory are normal.           Assessment & Plan:  Over 18min of which >50% spent in face-to-face contact with patient discussing plan of care  Problem List Items Addressed This Visit   Anxiety state, unspecified     Symptoms recently worsening. Exacerbated by poor pain control. Will try changing from Alprazolam to Clonazepam with goal of longer acting control of anxiety. Follow up 4 weeks and prn. Discussed potential side effects of Clonazepam including sedation.    Relevant Medications      clonazePAM (KLONOPIN)  tablet   Chronic low back pain - Primary     Chronic low back pain secondary to DJD and spinal stenosis. Followed by [facility]. Will request recent MRIs. Pt requests second opinion from neurosurgery. Referral placed. Encouraged him to increase Neurontin to 300mg  po tid and use Oxycodone as prescribed by Dr. Renato Shin.    Relevant Medications      tiZANidine (ZANAFLEX) 4 MG capsule   Other Relevant Orders      Ambulatory referral to Neurosurgery   Erectile dysfunction     Started on new medication by Urology. Will request notes on this.    Hypertension      BP Readings from Last 3 Encounters:  04/14/13 130/90  08/04/12 134/98  10/24/11 120/80   BP slightly elevated today, however pt reports severe pain, appears uncomfortable. Has been taking only half tab of Lisinopril daily. Encouraged him to take full 5mg . Will recheck BP in 4 weeks and he will also monitor at home.    Hypogonadism male     Will request recent labs including Testosterone levels from Urology.    Skin lesion     Lesions over abdomen consistent with seborrheic keratoses. Discussed the benign nature of these lesions. Discussed cryotherapy. He will consider evaluation with dermatology in the future.        Return in about 4 weeks (around 05/12/2013).

## 2013-04-14 NOTE — Assessment & Plan Note (Signed)
BP Readings from Last 3 Encounters:  04/14/13 130/90  08/04/12 134/98  10/24/11 120/80   BP slightly elevated today, however pt reports severe pain, appears uncomfortable. Has been taking only half tab of Lisinopril daily. Encouraged him to take full 5mg . Will recheck BP in 4 weeks and he will also monitor at home.

## 2013-04-14 NOTE — Telephone Encounter (Signed)
Relevant patient education assigned to patient using Emmi. ° °

## 2013-05-19 ENCOUNTER — Ambulatory Visit: Payer: Self-pay | Admitting: Internal Medicine

## 2013-05-27 ENCOUNTER — Ambulatory Visit (INDEPENDENT_AMBULATORY_CARE_PROVIDER_SITE_OTHER): Payer: BC Managed Care – PPO | Admitting: Internal Medicine

## 2013-05-27 ENCOUNTER — Encounter: Payer: Self-pay | Admitting: Internal Medicine

## 2013-05-27 VITALS — BP 128/82 | HR 65 | Temp 98.7°F | Ht 71.0 in | Wt 186.5 lb

## 2013-05-27 DIAGNOSIS — M545 Low back pain, unspecified: Secondary | ICD-10-CM

## 2013-05-27 DIAGNOSIS — F411 Generalized anxiety disorder: Secondary | ICD-10-CM

## 2013-05-27 DIAGNOSIS — I10 Essential (primary) hypertension: Secondary | ICD-10-CM

## 2013-05-27 DIAGNOSIS — G8929 Other chronic pain: Secondary | ICD-10-CM

## 2013-05-27 MED ORDER — CLONAZEPAM 0.5 MG PO TABS: 0.5000 mg | ORAL_TABLET | Freq: Three times a day (TID) | ORAL | Status: DC | PRN

## 2013-05-27 NOTE — Progress Notes (Signed)
Subjective:    Patient ID: Gregory Drown Sr., male    DOB: 08-Dec-1965, 48 y.o.   MRN: [id number]  HPI 48YO male presents for follow up.  Chronic back pain - Persistent symptoms of severe low back pain with radiating pain to right leg and foot. S/p evaluation by Dr. Arnoldo Morale, who reportedly told pt surgery should be last resort. Followed by pain management. Taking Oxycodone 5mg  5 times daily with complete relief of pain for brief periods of 1-2hr on medication. In between, has severe pain. Up at night "writhing" in pain. Feels irritable and anxious. More depressed with constant pain.   Started on Clonazepam last visit for anxiety with some improvement in symptoms. Feels this controls anxiety symptoms better than alprazolam.   Review of Systems  Constitutional: Negative for fever, chills, activity change, appetite change, fatigue and unexpected weight change.  Eyes: Negative for visual disturbance.  Respiratory: Negative for cough and shortness of breath.   Cardiovascular: Negative for chest pain, palpitations and leg swelling.  Gastrointestinal: Negative for abdominal pain and abdominal distention.  Genitourinary: Negative for dysuria, urgency and difficulty urinating.  Musculoskeletal: Positive for arthralgias and myalgias. Negative for gait problem.  Skin: Negative for color change and rash.  Hematological: Negative for adenopathy.  Psychiatric/Behavioral: Positive for sleep disturbance and dysphoric mood. The patient is nervous/anxious.        Objective:    BP 128/82  Pulse 65  Temp(Src) 98.7 F (37.1 C) (Oral)  Ht 5\' 11"  (1.803 m)  Wt 186 lb 8 oz (84.596 kg)  BMI 26.02 kg/m2  SpO2 97% Physical Exam  Constitutional: He is oriented to person, place, and time. He appears well-developed and well-nourished. No distress.  HENT:  Head: Normocephalic and atraumatic.  Right Ear: External ear normal.  Left Ear: External ear normal.  Nose: Nose normal.  Mouth/Throat: Oropharynx is  clear and moist. No oropharyngeal exudate.  Eyes: Conjunctivae and EOM are normal. Pupils are equal, round, and reactive to light. Right eye exhibits no discharge. Left eye exhibits no discharge. No scleral icterus.  Neck: Normal range of motion. Neck supple. No tracheal deviation present. No thyromegaly present.  Cardiovascular: Normal rate, regular rhythm and normal heart sounds.  Exam reveals no gallop and no friction rub.   No murmur heard. Pulmonary/Chest: Effort normal and breath sounds normal. No accessory muscle usage. Not tachypneic. No respiratory distress. He has no decreased breath sounds. He has no wheezes. He has no rhonchi. He has no rales. He exhibits no tenderness.  Musculoskeletal: He exhibits no edema.       Lumbar back: He exhibits decreased range of motion, tenderness and pain.  Lymphadenopathy:    He has no cervical adenopathy.  Neurological: He is alert and oriented to person, place, and time. No cranial nerve deficit. Coordination normal.  Skin: Skin is warm and dry. No rash noted. He is not diaphoretic. No erythema. No pallor.  Psychiatric: His behavior is normal. Judgment and thought content normal. His mood appears anxious. He exhibits a depressed mood. He expresses no suicidal ideation.          Assessment & Plan:   Problem List Items Addressed This Visit     Unprioritized   Anxiety state, unspecified     Symptoms improved with use of Clonazepam. Will continue.    Relevant Medications      clonazePAM (KLONOPIN)  tablet   Chronic low back pain - Primary     Chronic low back pain secondary to  DJD and spinal stenosis. No improvement with non-narcotic pain medications, ESI. Pilar Plate discussion today about risks of long term narcotic treatment for pain. Recommended he strongly consider surgery. Will set up second opinion evaluation with [facility].     Relevant Medications      oxyCODONE-acetaminophen (ROXICET) 5-325 MG/5ML solution      oxyCODONE (OXY  IR/ROXICODONE) 5 MG immediate release tablet   Other Relevant Orders      Ambulatory referral to Pain Clinic   Hypertension      BP Readings from Last 3 Encounters:  05/27/13 128/82  04/14/13 130/90  08/04/12 134/98   BP improved. Continue lisinopril.        Return in about 3 months (around 08/27/2013) for Recheck.

## 2013-05-27 NOTE — Assessment & Plan Note (Signed)
BP Readings from Last 3 Encounters:  05/27/13 128/82  04/14/13 130/90  08/04/12 134/98   BP improved. Continue lisinopril.

## 2013-05-27 NOTE — Assessment & Plan Note (Signed)
Chronic low back pain secondary to DJD and spinal stenosis. No improvement with non-narcotic pain medications, ESI. Pilar Plate discussion today about risks of long term narcotic treatment for pain. Recommended he strongly consider surgery. Will set up second opinion evaluation with [facility].

## 2013-05-27 NOTE — Assessment & Plan Note (Signed)
Symptoms improved with use of Clonazepam. Will continue.

## 2013-05-27 NOTE — Progress Notes (Signed)
Pre visit review using our clinic review tool, if applicable. No additional management support is needed unless otherwise documented below in the visit note. 

## 2013-06-02 ENCOUNTER — Ambulatory Visit: Payer: Self-pay | Admitting: Pain Medicine

## 2013-07-14 ENCOUNTER — Ambulatory Visit: Payer: Self-pay | Admitting: Pain Medicine

## 2013-08-06 ENCOUNTER — Ambulatory Visit: Payer: Self-pay | Admitting: Pain Medicine

## 2013-09-28 ENCOUNTER — Telehealth: Payer: Self-pay

## 2013-09-28 DIAGNOSIS — G894 Chronic pain syndrome: Secondary | ICD-10-CM

## 2013-09-28 NOTE — Telephone Encounter (Signed)
Referral placed.

## 2013-09-28 NOTE — Telephone Encounter (Signed)
The patient called and is hoping to get a new pain clinic referral. He wants to go see Dr.Liebelt in [facility]  (Phone [phone]).  He states his previous pain clinic has become too costly (700/month)   Thanks!

## 2013-09-30 NOTE — Telephone Encounter (Signed)
The patient called back and is hoping his pain medicine can be adjusted to cover his increasing pain in the evening and at night.  He understands the pain clinics take a two to three weeks to call and schedule the patient (once the referral is received), and then with the first visit, they are not prescribed pain medicine.

## 2013-10-01 NOTE — Telephone Encounter (Signed)
He needs to follow up with his current pain management physician until new evaluation set up

## 2013-10-01 NOTE — Telephone Encounter (Signed)
I dont think this message was routed correctly.  My mistake.

## 2013-10-04 NOTE — Telephone Encounter (Signed)
Spoke with pt to notify him but he was very angry about the length of time it has taken for the referral, pt states "I am not going to see current pain management physician because the treatment I'm receiving from them is not working" I appologized to pt explained to him that I do not handle referrals but I would see if I could get him an appt today, but he was still angry and said that he does not need for [place] to make an appt he can do it himself.

## 2013-10-26 ENCOUNTER — Telehealth: Payer: Self-pay | Admitting: Internal Medicine

## 2013-10-26 NOTE — Telephone Encounter (Signed)
He needs to be moved to a 38min slot. Next available is fine.

## 2013-10-26 NOTE — Telephone Encounter (Signed)
Pt is requesting to have an appointment for med review. Pt was scheduled for a 65mins slot and then was called back to change to longer appt time due to note in pt chart for 30 mins appt with Dr. Gilford Rile.Pt stated that his just want to speak about getting off of clonazepam. Pt became upset after being asked to reschedule the appt. After speaking with his doctor at the pain clinic he has been advise to taper off of this medication. Pt stated that he will need a detailed letter from Dr. Gilford Rile detailing how he taper off of the clonazepam. Please advise where to add pt to the schedule.msn

## 2013-10-26 NOTE — Telephone Encounter (Signed)
Please see below note

## 2013-10-27 ENCOUNTER — Encounter: Payer: Self-pay | Admitting: Internal Medicine

## 2013-11-08 ENCOUNTER — Ambulatory Visit: Payer: BC Managed Care – PPO | Admitting: Internal Medicine

## 2013-12-07 ENCOUNTER — Telehealth: Payer: Self-pay | Admitting: *Deleted

## 2013-12-07 MED ORDER — LISINOPRIL 5 MG PO TABS: 5.0000 mg | ORAL_TABLET | Freq: Every day | ORAL | Status: DC

## 2013-12-07 NOTE — Telephone Encounter (Signed)
Yes, fine to fill 90 day interval 3 refill

## 2013-12-07 NOTE — Telephone Encounter (Signed)
Rx sent 

## 2013-12-07 NOTE — Telephone Encounter (Signed)
Pt called asking for lisinopril (PRINIVIL,ZESTRIL) 5 MG tablet to be filled, this medication is a historical medication, you have never prescribed for him.  Okay to fill?

## 2014-02-01 ENCOUNTER — Encounter: Payer: Self-pay | Admitting: Internal Medicine

## 2014-02-01 MED ORDER — OMEPRAZOLE 40 MG PO CPDR: 40.0000 mg | DELAYED_RELEASE_CAPSULE | Freq: Every day | ORAL | Status: DC

## 2014-07-24 ENCOUNTER — Other Ambulatory Visit: Payer: Self-pay | Admitting: Internal Medicine

## 2014-07-25 NOTE — Telephone Encounter (Signed)
Last OV 5.21.15, last refill 1.26.16.  Please advise refill

## 2014-12-14 DIAGNOSIS — Z8619 Personal history of other infectious and parasitic diseases: Secondary | ICD-10-CM | POA: Insufficient documentation

## 2015-01-16 ENCOUNTER — Other Ambulatory Visit: Payer: Self-pay | Admitting: Internal Medicine

## 2015-01-17 NOTE — Telephone Encounter (Signed)
Pt last seen for OV 11/08/13 for med f/u, no future appts. Please Advise/tvw

## 2015-01-30 ENCOUNTER — Encounter: Payer: Self-pay | Admitting: Internal Medicine

## 2015-01-30 ENCOUNTER — Ambulatory Visit (INDEPENDENT_AMBULATORY_CARE_PROVIDER_SITE_OTHER): Payer: Managed Care, Other (non HMO) | Admitting: Internal Medicine

## 2015-01-30 VITALS — BP 132/82 | HR 90 | Temp 98.9°F | Ht 71.0 in | Wt 190.5 lb

## 2015-01-30 DIAGNOSIS — M545 Low back pain: Secondary | ICD-10-CM | POA: Diagnosis not present

## 2015-01-30 DIAGNOSIS — Z23 Encounter for immunization: Secondary | ICD-10-CM

## 2015-01-30 DIAGNOSIS — S6992XA Unspecified injury of left wrist, hand and finger(s), initial encounter: Secondary | ICD-10-CM | POA: Insufficient documentation

## 2015-01-30 DIAGNOSIS — G8929 Other chronic pain: Secondary | ICD-10-CM | POA: Diagnosis not present

## 2015-01-30 DIAGNOSIS — F411 Generalized anxiety disorder: Secondary | ICD-10-CM | POA: Diagnosis not present

## 2015-01-30 DIAGNOSIS — I10 Essential (primary) hypertension: Secondary | ICD-10-CM | POA: Diagnosis not present

## 2015-01-30 MED ORDER — LISINOPRIL 5 MG PO TABS: 5.0000 mg | ORAL_TABLET | Freq: Every day | ORAL | Status: DC

## 2015-01-30 MED ORDER — CLONAZEPAM 0.5 MG PO TABS: 0.5000 mg | ORAL_TABLET | Freq: Three times a day (TID) | ORAL | Status: DC | PRN

## 2015-01-30 MED ORDER — OMEPRAZOLE 40 MG PO CPDR: DELAYED_RELEASE_CAPSULE | ORAL | Status: DC

## 2015-01-30 MED ORDER — ESCITALOPRAM OXALATE 5 MG PO TABS: 5.0000 mg | ORAL_TABLET | Freq: Every day | ORAL | Status: DC

## 2015-01-30 NOTE — Patient Instructions (Addendum)
Start Lexapro 5mg  daily.  Continue Clonazepam 0.5mg  three times daily as needed for anxiety.   Xray of left hand tomorrow in [place].    Labs today.

## 2015-01-30 NOTE — Assessment & Plan Note (Signed)
Crush injury left distal middle finger. Will get plain xray evaluation.

## 2015-01-30 NOTE — Progress Notes (Signed)
Pre visit review using our clinic review tool, if applicable. No additional management support is needed unless otherwise documented below in the visit note. 

## 2015-01-30 NOTE — Assessment & Plan Note (Signed)
BP Readings from Last 3 Encounters:  01/30/15 132/82  05/27/13 128/82  04/14/13 130/90   BP well controlled. Renal function with labs.

## 2015-01-30 NOTE — Assessment & Plan Note (Signed)
Continues on Oxycodone for chronic pain through pain clinic. Of note, he also recently saw a dentist and received Hydrocodone. No narcotic Rx this office.

## 2015-01-30 NOTE — Progress Notes (Signed)
Subjective:    Patient ID: Gregory Drown Sr., male    DOB: Dec 05, 1965, 50 y.o.   MRN: [id number]  HPI  50YO male presents for followup.  Last seen in 2015.  Anxiety - Taking Clonazepam. Taking about 3 x per day. Symptoms generally well controlled, however sometimes having anxiety attacks.  Smashed middle finger left hand 2 months ago in a car door. This has been warm and painful since. Never had xrays. He is on chronic pain medication, but continues to have pain. No fever, chills. No change in ROM.  Chronic back pain - Continues on Meloxicam, Zanaflex, and Oxycodone 10mg  3-5 times per day. Followed by Dr. Kennith Center in [place] at pain management. Feels that this is helpful. Has also tried Oxycontin and Fentanyl with no improvement. Also using some chiropractic techniques and meditation. Sometimes has muscle cramps improved with magnesium.      Wt Readings from Last 3 Encounters:  01/30/15 190 lb 8 oz (86.41 kg)  05/27/13 186 lb 8 oz (84.596 kg)  04/14/13 192 lb 8 oz (87.317 kg)   BP Readings from Last 3 Encounters:  01/30/15 132/82  05/27/13 128/82  04/14/13 130/90    Past Medical History  Diagnosis Date  . GERD (gastroesophageal reflux disease)   . Hypertension   . Low testosterone   . Insomnia   . Anxiety    Family History  Problem Relation Age of Onset  . Lung cancer Father     deceased   Past Surgical History  Procedure Laterality Date  . Unremarkable    . Nasal sinus surgery      Dr. Carlis Abbott   Social History   Social History  . Marital Status: Married    Spouse Name: N/A  . Number of Children: N/A  . Years of Education: N/A   Social History Main Topics  . Smoking status: Former Smoker    Types: Cigarettes  . Smokeless tobacco: Never Used  . Alcohol Use: No  . Drug Use: None  . Sexual Activity: Not Asked   Other Topics Concern  . None   Social History Narrative   Lives in [place] with wife      Work - Chief of Staff    Review of Systems    Constitutional: Negative for fever, chills, activity change, appetite change, fatigue and unexpected weight change.  Eyes: Negative for visual disturbance.  Respiratory: Negative for cough and shortness of breath.   Cardiovascular: Negative for chest pain, palpitations and leg swelling.  Gastrointestinal: Negative for abdominal pain, diarrhea, constipation and abdominal distention.  Genitourinary: Negative for dysuria, urgency and difficulty urinating.  Musculoskeletal: Positive for myalgias, back pain, joint swelling and arthralgias. Negative for gait problem.  Skin: Positive for color change. Negative for rash.  Neurological: Negative for weakness.  Hematological: Negative for adenopathy.  Psychiatric/Behavioral: Positive for sleep disturbance. Negative for dysphoric mood. The patient is nervous/anxious.        Objective:    BP 132/82 mmHg  Pulse 90  Temp(Src) 98.9 F (37.2 C) (Oral)  Ht 5\' 11"  (1.803 m)  Wt 190 lb 8 oz (86.41 kg)  BMI 26.58 kg/m2  SpO2 97% Physical Exam  Constitutional: He is oriented to person, place, and time. He appears well-developed and well-nourished. No distress.  HENT:  Head: Normocephalic and atraumatic.  Right Ear: External ear normal.  Left Ear: External ear normal.  Nose: Nose normal.  Mouth/Throat: Oropharynx is clear and moist. No oropharyngeal exudate.  Eyes: Conjunctivae and EOM are  normal. Pupils are equal, round, and reactive to light. Right eye exhibits no discharge. Left eye exhibits no discharge. No scleral icterus.  Neck: Normal range of motion. Neck supple. No tracheal deviation present. No thyromegaly present.  Cardiovascular: Normal rate, regular rhythm and normal heart sounds.  Exam reveals no gallop and no friction rub.   No murmur heard. Pulmonary/Chest: Effort normal and breath sounds normal. No accessory muscle usage. No tachypnea. No respiratory distress. He has no decreased breath sounds. He has no wheezes. He has no rhonchi.  He has no rales. He exhibits no tenderness.  Musculoskeletal: Normal range of motion. He exhibits no edema.       Hands: Lymphadenopathy:    He has no cervical adenopathy.  Neurological: He is alert and oriented to person, place, and time. No cranial nerve deficit. Coordination normal.  Skin: Skin is warm and dry. No rash noted. He is not diaphoretic. No erythema. No pallor.  Psychiatric: His speech is normal and behavior is normal. Judgment and thought content normal. His mood appears anxious. Cognition and memory are normal.          Assessment & Plan:   Problem List Items Addressed This Visit      Unprioritized   Chronic low back pain    Continues on Oxycodone for chronic pain through pain clinic. Of note, he also recently saw a dentist and received Hydrocodone. No narcotic Rx this office.      Relevant Medications   Oxycodone HCl 10 MG TABS   Other Relevant Orders   CBC with Differential/Platelet   Generalized anxiety disorder - Primary    Symptoms of anxiety worsening. Will add Lexapro 5mg  daily. Continue prn Clonazepam. Long term goal of getting off Benzodiazepine. Discussed risks of mixing Clonazepam and chronic narcotics. Follow up 4 weeks.      Relevant Medications   clonazePAM (KLONOPIN) 0.5 MG tablet   Hypertension    BP Readings from Last 3 Encounters:  01/30/15 132/82  05/27/13 128/82  04/14/13 130/90   BP well controlled. Renal function with labs.      Relevant Medications   lisinopril (PRINIVIL,ZESTRIL) 5 MG tablet   Other Relevant Orders   Comprehensive metabolic panel   Lipid panel   Microalbumin / creatinine urine ratio   TSH   Injury of finger of left hand    Crush injury left distal middle finger. Will get plain xray evaluation.      Relevant Orders   DG Hand Complete Left    Other Visit Diagnoses    Need for influenza vaccination        Relevant Orders    Flu Vaccine QUAD 36+ mos PF IM (Fluarix & Fluzone Quad PF) (Completed)         Return in about 4 weeks (around 02/27/2015) for Recheck.

## 2015-01-30 NOTE — Assessment & Plan Note (Signed)
Symptoms of anxiety worsening. Will add Lexapro 5mg  daily. Continue prn Clonazepam. Long term goal of getting off Benzodiazepine. Discussed risks of mixing Clonazepam and chronic narcotics. Follow up 4 weeks.

## 2015-01-31 ENCOUNTER — Ambulatory Visit
Admission: RE | Admit: 2015-01-31 | Discharge: 2015-01-31 | Disposition: A | Discharge status: Home / Self Care | Payer: Managed Care, Other (non HMO) | Source: Ambulatory Visit | Attending: Internal Medicine | Admitting: Internal Medicine

## 2015-01-31 ENCOUNTER — Other Ambulatory Visit: Payer: Self-pay | Admitting: Internal Medicine

## 2015-01-31 DIAGNOSIS — S6992XA Unspecified injury of left wrist, hand and finger(s), initial encounter: Secondary | ICD-10-CM | POA: Diagnosis present

## 2015-01-31 DIAGNOSIS — S62663A Nondisplaced fracture of distal phalanx of left middle finger, initial encounter for closed fracture: Secondary | ICD-10-CM | POA: Insufficient documentation

## 2015-01-31 DIAGNOSIS — X58XXXA Exposure to other specified factors, initial encounter: Secondary | ICD-10-CM | POA: Diagnosis not present

## 2015-01-31 DIAGNOSIS — S62609A Fracture of unspecified phalanx of unspecified finger, initial encounter for closed fracture: Secondary | ICD-10-CM

## 2015-01-31 LAB — COMPREHENSIVE METABOLIC PANEL
ALT: 29 U/L (ref 0–53)
AST: 27 U/L (ref 0–37)
Albumin: 4.5 g/dL (ref 3.5–5.2)
Alkaline Phosphatase: 70 U/L (ref 39–117)
BILIRUBIN TOTAL: 0.4 mg/dL (ref 0.2–1.2)
BUN: 11 mg/dL (ref 6–23)
CO2: 28 meq/L (ref 19–32)
CREATININE: 1.1 mg/dL (ref 0.40–1.50)
Calcium: 9.4 mg/dL (ref 8.4–10.5)
Chloride: 104 mEq/L (ref 96–112)
GFR: 75.41 mL/min (ref >60.00)
GLUCOSE: 64 mg/dL — AB (ref 70–99)
Potassium: 4.3 mEq/L (ref 3.5–5.1)
Sodium: 140 mEq/L (ref 135–145)
Total Protein: 7.2 g/dL (ref 6.0–8.3)

## 2015-01-31 LAB — CBC WITH DIFFERENTIAL/PLATELET
BASOS ABS: 0.1 10*3/uL (ref 0.0–0.1)
Basophils Relative: 1 % (ref 0.0–3.0)
EOS ABS: 0.3 10*3/uL (ref 0.0–0.7)
Eosinophils Relative: 2.8 % (ref 0.0–5.0)
HEMATOCRIT: 46.8 % (ref 39.0–52.0)
Hemoglobin: 15.7 g/dL (ref 13.0–17.0)
LYMPHS ABS: 1.7 10*3/uL (ref 0.7–4.0)
LYMPHS PCT: 17.6 % (ref 12.0–46.0)
MCHC: 33.5 g/dL (ref 30.0–36.0)
MCV: 89.8 fl (ref 78.0–100.0)
Monocytes Absolute: 1 10*3/uL (ref 0.1–1.0)
Monocytes Relative: 10.6 % (ref 3.0–12.0)
NEUTROS ABS: 6.7 10*3/uL (ref 1.4–7.7)
NEUTROS PCT: 68 % (ref 43.0–77.0)
PLATELETS: 254 10*3/uL (ref 150.0–400.0)
RBC: 5.21 Mil/uL (ref 4.22–5.81)
RDW: 12.6 % (ref 11.5–15.5)
WBC: 9.9 10*3/uL (ref 4.0–10.5)

## 2015-01-31 LAB — MICROALBUMIN / CREATININE URINE RATIO
Creatinine,U: 42.2 mg/dL
MICROALB UR: 0.9 mg/dL (ref 0.0–1.9)
MICROALB/CREAT RATIO: 2.1 mg/g (ref 0.0–30.0)

## 2015-01-31 LAB — LIPID PANEL
Cholesterol: 161 mg/dL (ref 0–200)
HDL: 31 mg/dL — ABNORMAL LOW (ref >39.00)
NONHDL: 129.54
Total CHOL/HDL Ratio: 5
Triglycerides: 232 mg/dL — ABNORMAL HIGH (ref 0.0–149.0)
VLDL: 46.4 mg/dL — ABNORMAL HIGH (ref 0.0–40.0)

## 2015-01-31 LAB — LDL CHOLESTEROL, DIRECT: LDL DIRECT: 108 mg/dL

## 2015-01-31 LAB — TSH: TSH: 0.69 u[IU]/mL (ref 0.35–4.50)

## 2015-02-01 ENCOUNTER — Encounter: Payer: Self-pay | Admitting: Internal Medicine

## 2015-02-27 ENCOUNTER — Ambulatory Visit: Payer: 59 | Admitting: Internal Medicine

## 2015-03-03 ENCOUNTER — Encounter: Payer: Self-pay | Admitting: Internal Medicine

## 2015-03-03 ENCOUNTER — Ambulatory Visit (INDEPENDENT_AMBULATORY_CARE_PROVIDER_SITE_OTHER): Payer: Managed Care, Other (non HMO) | Admitting: Internal Medicine

## 2015-03-03 VITALS — BP 127/84 | HR 82 | Temp 98.2°F | Ht 71.0 in | Wt 190.5 lb

## 2015-03-03 DIAGNOSIS — I10 Essential (primary) hypertension: Secondary | ICD-10-CM

## 2015-03-03 DIAGNOSIS — F411 Generalized anxiety disorder: Secondary | ICD-10-CM | POA: Diagnosis not present

## 2015-03-03 DIAGNOSIS — M545 Low back pain, unspecified: Secondary | ICD-10-CM

## 2015-03-03 DIAGNOSIS — G8929 Other chronic pain: Secondary | ICD-10-CM

## 2015-03-03 NOTE — Assessment & Plan Note (Signed)
Followed by pain management. On chronic Oxycodone.

## 2015-03-03 NOTE — Assessment & Plan Note (Signed)
Discussed SSRIs and benzodiazepines and their use. Recommended he restart Lexapro 5mg  daily and then use Clonazepam prn for severe anxiety. Consider psychiatry referral if symptoms not better controlled next visit.

## 2015-03-03 NOTE — Assessment & Plan Note (Signed)
BP Readings from Last 3 Encounters:  03/03/15 127/84  01/30/15 132/82  05/27/13 128/82   BP well controlled. Continue Lisinopril.

## 2015-03-03 NOTE — Progress Notes (Signed)
Subjective:    Patient ID: Gregory Drown Sr., male    DOB: August 06, 1965, 50 y.o.   MRN: [id number]  HPI  50YO male presents for follow up.  Anxiety - Last visit added Lexapro 5mg  daily to help with anxiety. Only tried the Lexapro a few times. Felt drowsy on the Clonazepam. Stopped both medications. Continues to have frequent panic attacks. Feels agitated at times. Has some angry outbursts. Would like to get back on Alprazolam which worked well for him in the past, however he cannot take this on chronic pain management with narcotics, per his pain management physician.  Chronic pain - Taking Oxycodone 10mg  up to five times daily. Generally, good control of pain with this.    Wt Readings from Last 3 Encounters:  03/03/15 190 lb 8 oz (86.41 kg)  01/30/15 190 lb 8 oz (86.41 kg)  05/27/13 186 lb 8 oz (84.596 kg)   BP Readings from Last 3 Encounters:  03/03/15 127/84  01/30/15 132/82  05/27/13 128/82    Past Medical History  Diagnosis Date  . GERD (gastroesophageal reflux disease)   . Hypertension   . Low testosterone   . Insomnia   . Anxiety    Family History  Problem Relation Age of Onset  . Lung cancer Father     deceased   Past Surgical History  Procedure Laterality Date  . Unremarkable    . Nasal sinus surgery      Dr. Carlis Abbott   Social History   Social History  . Marital Status: Married    Spouse Name: N/A  . Number of Children: N/A  . Years of Education: N/A   Social History Main Topics  . Smoking status: Former Smoker    Types: Cigarettes  . Smokeless tobacco: Never Used  . Alcohol Use: No  . Drug Use: None  . Sexual Activity: Not Asked   Other Topics Concern  . None   Social History Narrative   Lives in [place] with wife      Work - Chief of Staff    Review of Systems  Constitutional: Negative for fever, chills, activity change, appetite change, fatigue and unexpected weight change.  Eyes: Negative for visual disturbance.  Respiratory: Negative  for cough and shortness of breath.   Cardiovascular: Negative for chest pain, palpitations and leg swelling.  Gastrointestinal: Negative for abdominal pain and abdominal distention.  Genitourinary: Negative for dysuria, urgency and difficulty urinating.  Musculoskeletal: Positive for myalgias, back pain and arthralgias. Negative for gait problem.  Skin: Negative for color change and rash.  Hematological: Negative for adenopathy.  Psychiatric/Behavioral: Positive for behavioral problems and agitation. Negative for suicidal ideas, sleep disturbance and dysphoric mood. The patient is nervous/anxious.        Objective:    BP 127/84 mmHg  Pulse 82  Temp(Src) 98.2 F (36.8 C) (Oral)  Ht 5\' 11"  (1.803 m)  Wt 190 lb 8 oz (86.41 kg)  BMI 26.58 kg/m2  SpO2 99% Physical Exam  Constitutional: He is oriented to person, place, and time. He appears well-developed and well-nourished. No distress.  HENT:  Head: Normocephalic and atraumatic.  Right Ear: External ear normal.  Left Ear: External ear normal.  Nose: Nose normal.  Mouth/Throat: Oropharynx is clear and moist. No oropharyngeal exudate.  Eyes: Conjunctivae and EOM are normal. Pupils are equal, round, and reactive to light. Right eye exhibits no discharge. Left eye exhibits no discharge. No scleral icterus.  Neck: Normal range of motion. Neck supple. No tracheal  deviation present. No thyromegaly present.  Cardiovascular: Normal rate, regular rhythm and normal heart sounds.  Exam reveals no gallop and no friction rub.   No murmur heard. Pulmonary/Chest: Effort normal and breath sounds normal. No accessory muscle usage. No tachypnea. No respiratory distress. He has no decreased breath sounds. He has no wheezes. He has no rhonchi. He has no rales. He exhibits no tenderness.  Musculoskeletal: Normal range of motion. He exhibits no edema.  Lymphadenopathy:    He has no cervical adenopathy.  Neurological: He is alert and oriented to person,  place, and time. No cranial nerve deficit. Coordination normal.  Skin: Skin is warm and dry. No rash noted. He is not diaphoretic. No erythema. No pallor.  Psychiatric: Judgment and thought content normal. His mood appears anxious. His speech is rapid and/or pressured. He is agitated. Cognition and memory are normal.          Assessment & Plan:   Problem List Items Addressed This Visit      Unprioritized   Chronic low back pain    Followed by pain management. On chronic Oxycodone.       Generalized anxiety disorder - Primary    Discussed SSRIs and benzodiazepines and their use. Recommended he restart Lexapro 5mg  daily and then use Clonazepam prn for severe anxiety. Consider psychiatry referral if symptoms not better controlled next visit.      Hypertension    BP Readings from Last 3 Encounters:  03/03/15 127/84  01/30/15 132/82  05/27/13 128/82   BP well controlled. Continue Lisinopril.          Return in about 4 weeks (around 03/31/2015) for Recheck.  Ronette Deter, MD [facility]

## 2015-03-03 NOTE — Progress Notes (Signed)
Pre visit review using our clinic review tool, if applicable. No additional management support is needed unless otherwise documented below in the visit note. 

## 2015-03-03 NOTE — Patient Instructions (Addendum)
Start Lexapro 5mg  daily. This medication will take a few weeks to help improve anxiety.  Try using Clonazepam 0.5mg  up to three times daily as need for severe anxiety.

## 2015-03-31 ENCOUNTER — Encounter: Payer: Self-pay | Admitting: Internal Medicine

## 2015-03-31 ENCOUNTER — Ambulatory Visit (INDEPENDENT_AMBULATORY_CARE_PROVIDER_SITE_OTHER): Payer: Managed Care, Other (non HMO) | Admitting: Internal Medicine

## 2015-03-31 VITALS — BP 107/65 | HR 64 | Temp 98.4°F | Ht 71.0 in | Wt 194.5 lb

## 2015-03-31 DIAGNOSIS — M545 Low back pain, unspecified: Secondary | ICD-10-CM

## 2015-03-31 DIAGNOSIS — F411 Generalized anxiety disorder: Secondary | ICD-10-CM | POA: Diagnosis not present

## 2015-03-31 DIAGNOSIS — G8929 Other chronic pain: Secondary | ICD-10-CM | POA: Diagnosis not present

## 2015-03-31 DIAGNOSIS — N529 Male erectile dysfunction, unspecified: Secondary | ICD-10-CM | POA: Diagnosis not present

## 2015-03-31 MED ORDER — VENLAFAXINE HCL ER 37.5 MG PO CP24: 37.5000 mg | ORAL_CAPSULE | Freq: Every day | ORAL | Status: DC

## 2015-03-31 NOTE — Assessment & Plan Note (Signed)
Chronic back pain on narcotic prescribed by pain management. Will continue to follow up with pain management.

## 2015-03-31 NOTE — Assessment & Plan Note (Signed)
Symptoms improved with Lexapro, however concerned about sexual function. Will try change to Effexor. Recommended psychiatry evaluation, but he declines for now.

## 2015-03-31 NOTE — Patient Instructions (Signed)
Stop Lexapro.  Start Effexor.  Stop Clonazepam.

## 2015-03-31 NOTE — Assessment & Plan Note (Signed)
Worsening ED with use of Lexapro. Will try changing to Effexor. Follow up 4 weeks and prn. Note that chronic narcotics may also contribute.

## 2015-03-31 NOTE — Progress Notes (Signed)
Subjective:    Patient ID: Gregory Drown Sr., male    DOB: 1965/02/04, 50 y.o.   MRN: [id number]  HPI  50YO male presents for follow up.  Last seen in 02/2015. Started back on Lexapro and prn Clonazepam.  Some improvement in anxiety on Lexapro. Coworkers have noted less angry outbursts.  Frustrated with difficulty having orgasm on this medication. Feels drowsy with Clonazepam. Would like to go back on Alprazolam.   Wt Readings from Last 3 Encounters:  03/31/15 194 lb 8 oz (88.225 kg)  03/03/15 190 lb 8 oz (86.41 kg)  01/30/15 190 lb 8 oz (86.41 kg)   BP Readings from Last 3 Encounters:  03/31/15 107/65  03/03/15 127/84  01/30/15 132/82    Past Medical History  Diagnosis Date  . GERD (gastroesophageal reflux disease)   . Hypertension   . Low testosterone   . Insomnia   . Anxiety    Family History  Problem Relation Age of Onset  . Lung cancer Father     deceased   Past Surgical History  Procedure Laterality Date  . Unremarkable    . Nasal sinus surgery      Dr. Carlis Abbott   Social History   Social History  . Marital Status: Married    Spouse Name: N/A  . Number of Children: N/A  . Years of Education: N/A   Social History Main Topics  . Smoking status: Former Smoker    Types: Cigarettes  . Smokeless tobacco: Never Used  . Alcohol Use: No  . Drug Use: None  . Sexual Activity: Not Asked   Other Topics Concern  . None   Social History Narrative   Lives in [place] with wife      Work - Chief of Staff    Review of Systems  Constitutional: Negative for fever, chills, activity change, appetite change, fatigue and unexpected weight change.  Eyes: Negative for visual disturbance.  Respiratory: Negative for cough and shortness of breath.   Cardiovascular: Negative for chest pain, palpitations and leg swelling.  Gastrointestinal: Negative for abdominal pain, diarrhea, constipation and abdominal distention.  Genitourinary: Negative for dysuria, urgency and  difficulty urinating.  Musculoskeletal: Negative for arthralgias and gait problem.  Skin: Negative for color change and rash.  Hematological: Negative for adenopathy.  Psychiatric/Behavioral: Positive for behavioral problems and agitation. Negative for suicidal ideas, sleep disturbance and dysphoric mood. The patient is nervous/anxious.        Objective:    BP 107/65 mmHg  Pulse 64  Temp(Src) 98.4 F (36.9 C) (Oral)  Ht 5\' 11"  (1.803 m)  Wt 194 lb 8 oz (88.225 kg)  BMI 27.14 kg/m2  SpO2 95% Physical Exam  Constitutional: He is oriented to person, place, and time. He appears well-developed and well-nourished. No distress.  HENT:  Head: Normocephalic and atraumatic.  Right Ear: External ear normal.  Left Ear: External ear normal.  Nose: Nose normal.  Mouth/Throat: Oropharynx is clear and moist. No oropharyngeal exudate.  Eyes: Conjunctivae and EOM are normal. Pupils are equal, round, and reactive to light. Right eye exhibits no discharge. Left eye exhibits no discharge. No scleral icterus.  Neck: Normal range of motion. Neck supple. No tracheal deviation present. No thyromegaly present.  Cardiovascular: Normal rate, regular rhythm and normal heart sounds.  Exam reveals no gallop and no friction rub.   No murmur heard. Pulmonary/Chest: Effort normal and breath sounds normal. No accessory muscle usage. No tachypnea. No respiratory distress. He has no decreased breath sounds.  He has no wheezes. He has no rhonchi. He has no rales. He exhibits no tenderness.  Musculoskeletal: Normal range of motion. He exhibits no edema.  Lymphadenopathy:    He has no cervical adenopathy.  Neurological: He is alert and oriented to person, place, and time. No cranial nerve deficit. Coordination normal.  Skin: Skin is warm and dry. No rash noted. He is not diaphoretic. No erythema. No pallor.  Psychiatric: His speech is normal and behavior is normal. Judgment and thought content normal. His mood appears  anxious.          Assessment & Plan:   Problem List Items Addressed This Visit      Unprioritized   Chronic low back pain    Chronic back pain on narcotic prescribed by pain management. Will continue to follow up with pain management.      Erectile dysfunction    Worsening ED with use of Lexapro. Will try changing to Effexor. Follow up 4 weeks and prn. Note that chronic narcotics may also contribute.      Generalized anxiety disorder - Primary    Symptoms improved with Lexapro, however concerned about sexual function. Will try change to Effexor. Recommended psychiatry evaluation, but he declines for now.          Return in about 4 weeks (around 04/28/2015) for Recheck.  Ronette Deter, MD [facility]

## 2015-03-31 NOTE — Progress Notes (Signed)
Pre visit review using our clinic review tool, if applicable. No additional management support is needed unless otherwise documented below in the visit note. 

## 2015-05-03 ENCOUNTER — Encounter: Payer: Self-pay | Admitting: Internal Medicine

## 2015-05-04 MED ORDER — ESCITALOPRAM OXALATE 5 MG PO TABS: 5.0000 mg | ORAL_TABLET | Freq: Every day | ORAL | Status: DC

## 2015-05-04 MED ORDER — CLONAZEPAM 0.5 MG PO TABS: 0.5000 mg | ORAL_TABLET | Freq: Three times a day (TID) | ORAL | Status: DC | PRN

## 2015-05-04 NOTE — Telephone Encounter (Signed)
OK. Please send in Rx for Clonazepam by fax. I have sent in Rx for Lexapro. Then, we will need to reschedule his appointment for tomorrow for 2-4 weeks out, 35min.

## 2015-05-05 ENCOUNTER — Ambulatory Visit: Payer: Managed Care, Other (non HMO) | Admitting: Internal Medicine

## 2015-05-29 ENCOUNTER — Ambulatory Visit: Payer: Managed Care, Other (non HMO) | Admitting: Internal Medicine

## 2015-07-03 ENCOUNTER — Ambulatory Visit (INDEPENDENT_AMBULATORY_CARE_PROVIDER_SITE_OTHER): Payer: Managed Care, Other (non HMO) | Admitting: Internal Medicine

## 2015-07-03 ENCOUNTER — Encounter: Payer: Self-pay | Admitting: Internal Medicine

## 2015-07-03 VITALS — BP 120/78 | HR 67 | Ht 71.0 in | Wt 189.2 lb

## 2015-07-03 DIAGNOSIS — F411 Generalized anxiety disorder: Secondary | ICD-10-CM | POA: Diagnosis not present

## 2015-07-03 MED ORDER — ESCITALOPRAM OXALATE 10 MG PO TABS: 10.0000 mg | ORAL_TABLET | Freq: Every day | ORAL | Status: DC

## 2015-07-03 MED ORDER — ESCITALOPRAM OXALATE 5 MG PO TABS: 5.0000 mg | ORAL_TABLET | Freq: Every day | ORAL | Status: DC

## 2015-07-03 NOTE — Patient Instructions (Signed)
Increase Lexapro to 10mg  daily.  Continue off Clonazepam.  Follow up in 4 weeks.

## 2015-07-03 NOTE — Progress Notes (Signed)
Subjective:    Patient ID: Gregory Drown Sr., male    DOB: 02/01/1965, 50 y.o.   MRN: [id number]  HPI  50YO male presents for follow up.  Anxiety - Pain management physician wants him off the Clonazepam. Last took the medication about 1 week ago. Continues on Lexapro. Notes some increased agitation, esp at work. Easily angered. This is improved with Lexapro. Has some sexual side effects with medication, but feels trade off is worth it.  Wt Readings from Last 3 Encounters:  07/03/15 189 lb 3.2 oz (85.821 kg)  03/31/15 194 lb 8 oz (88.225 kg)  03/03/15 190 lb 8 oz (86.41 kg)   BP Readings from Last 3 Encounters:  07/03/15 120/78  03/31/15 107/65  03/03/15 127/84    Past Medical History  Diagnosis Date  . GERD (gastroesophageal reflux disease)   . Hypertension   . Low testosterone   . Insomnia   . Anxiety    Family History  Problem Relation Age of Onset  . Lung cancer Father     deceased   Past Surgical History  Procedure Laterality Date  . Unremarkable    . Nasal sinus surgery      Dr. Carlis Abbott   Social History   Social History  . Marital Status: Married    Spouse Name: N/A  . Number of Children: N/A  . Years of Education: N/A   Social History Main Topics  . Smoking status: Former Smoker    Types: Cigarettes  . Smokeless tobacco: Never Used  . Alcohol Use: No  . Drug Use: None  . Sexual Activity: Not Asked   Other Topics Concern  . None   Social History Narrative   Lives in [place] with wife      Work - Chief of Staff    Review of Systems  Constitutional: Negative for fever, chills, activity change, appetite change, fatigue and unexpected weight change.  Eyes: Negative for visual disturbance.  Respiratory: Negative for cough and shortness of breath.   Cardiovascular: Negative for chest pain, palpitations and leg swelling.  Gastrointestinal: Negative for abdominal pain and abdominal distention.  Genitourinary: Negative for dysuria, urgency and  difficulty urinating.  Musculoskeletal: Negative for arthralgias and gait problem.  Skin: Negative for color change and rash.  Hematological: Negative for adenopathy.  Psychiatric/Behavioral: Positive for behavioral problems and agitation. Negative for suicidal ideas, sleep disturbance, dysphoric mood and decreased concentration. The patient is nervous/anxious.        Objective:    BP 120/78 mmHg  Pulse 67  Ht 5\' 11"  (1.803 m)  Wt 189 lb 3.2 oz (85.821 kg)  BMI 26.40 kg/m2  SpO2 97% Physical Exam  Constitutional: He is oriented to person, place, and time. He appears well-developed and well-nourished. No distress.  HENT:  Head: Normocephalic and atraumatic.  Right Ear: External ear normal.  Left Ear: External ear normal.  Nose: Nose normal.  Mouth/Throat: Oropharynx is clear and moist. No oropharyngeal exudate.  Eyes: Conjunctivae and EOM are normal. Pupils are equal, round, and reactive to light. Right eye exhibits no discharge. Left eye exhibits no discharge. No scleral icterus.  Neck: Normal range of motion. Neck supple. No tracheal deviation present. No thyromegaly present.  Cardiovascular: Normal rate, regular rhythm and normal heart sounds.  Exam reveals no gallop and no friction rub.   No murmur heard. Pulmonary/Chest: Effort normal and breath sounds normal. No accessory muscle usage. No tachypnea. No respiratory distress. He has no decreased breath sounds. He has no  wheezes. He has no rhonchi. He has no rales. He exhibits no tenderness.  Musculoskeletal: Normal range of motion. He exhibits no edema.  Lymphadenopathy:    He has no cervical adenopathy.  Neurological: He is alert and oriented to person, place, and time. No cranial nerve deficit. Coordination normal.  Skin: Skin is warm and dry. No rash noted. He is not diaphoretic. No erythema. No pallor.  Psychiatric: His speech is normal and behavior is normal. Judgment and thought content normal. His mood appears anxious.           Assessment & Plan:  Over 66min of which >50% spent in face-to-face contact with patient discussing plan of care  Problem List Items Addressed This Visit      Unprioritized   Generalized anxiety disorder - Primary    Symptoms improved with Lexapro, but not completely controlled. Will increase Lexapro to 10mg  daily. Follow up in 4 weeks and prn.          Return in about 4 weeks (around 07/31/2015) for Recheck.  Ronette Deter, MD [facility]

## 2015-07-03 NOTE — Progress Notes (Signed)
Pre visit review using our clinic review tool, if applicable. No additional management support is needed unless otherwise documented below in the visit note. 

## 2015-07-03 NOTE — Assessment & Plan Note (Signed)
Symptoms improved with Lexapro, but not completely controlled. Will increase Lexapro to 10mg  daily. Follow up in 4 weeks and prn.

## 2015-07-17 ENCOUNTER — Telehealth: Payer: Self-pay | Admitting: *Deleted

## 2015-07-17 NOTE — Telephone Encounter (Signed)
Spoke with wife, explained that he has an appt on the 25th, states that husband wanted to see if he could see her sooner, I explained that I could put in with another provider, but that I didn't have another time sooner.  She was fine with that. thanks

## 2015-07-17 NOTE — Telephone Encounter (Signed)
Patient has follow up appt scheduled on 7/25, requesting a medication consult, please advise a date and time? thanks

## 2015-07-17 NOTE — Telephone Encounter (Signed)
??   I just saw him recently. Fine to keep 7/25 visit.

## 2015-07-17 NOTE — Telephone Encounter (Signed)
Patient wife requested to have her husband schedule with Dr.Walker for medication consult.  Contact Amy [phone]

## 2015-07-28 ENCOUNTER — Other Ambulatory Visit: Payer: Self-pay | Admitting: *Deleted

## 2015-07-28 MED ORDER — OMEPRAZOLE 40 MG PO CPDR: DELAYED_RELEASE_CAPSULE | ORAL | Status: DC

## 2015-07-30 ENCOUNTER — Other Ambulatory Visit: Payer: Self-pay | Admitting: Internal Medicine

## 2015-08-01 ENCOUNTER — Ambulatory Visit (INDEPENDENT_AMBULATORY_CARE_PROVIDER_SITE_OTHER): Payer: Managed Care, Other (non HMO) | Admitting: Internal Medicine

## 2015-08-01 ENCOUNTER — Encounter: Payer: Self-pay | Admitting: Internal Medicine

## 2015-08-01 VITALS — BP 144/80 | HR 73 | Ht 71.0 in | Wt 188.2 lb

## 2015-08-01 DIAGNOSIS — F411 Generalized anxiety disorder: Secondary | ICD-10-CM | POA: Diagnosis not present

## 2015-08-01 MED ORDER — CLONAZEPAM 0.5 MG PO TABS: 0.5000 mg | ORAL_TABLET | Freq: Three times a day (TID) | ORAL | 1 refills | Status: DC | PRN

## 2015-08-01 NOTE — Patient Instructions (Addendum)
Continue current medications    Follow up 3 months

## 2015-08-01 NOTE — Progress Notes (Signed)
Pre visit review using our clinic review tool, if applicable. No additional management support is needed unless otherwise documented below in the visit note. 

## 2015-08-01 NOTE — Progress Notes (Signed)
Subjective:    Patient ID: Gregory Drown Sr., male    DOB: 27-Apr-1965, 50 y.o.   MRN: [id number]  HPI  50YO male presents for follow up.  Last seen 06/2015 for GAD. Increased Lexapro to 10mg  daily.  Started himself back on Clonazepam and Lexapro. Anxiety well controlled. Less outbursts. Taking Clonazepam 0.5mg  three times daily.   Wt Readings from Last 3 Encounters:  08/01/15 188 lb 3.2 oz (85.4 kg)  07/03/15 189 lb 3.2 oz (85.8 kg)  03/31/15 194 lb 8 oz (88.2 kg)   BP Readings from Last 3 Encounters:  08/01/15 (!) 144/80  07/03/15 120/78  03/31/15 107/65    Past Medical History:  Diagnosis Date  . Anxiety   . GERD (gastroesophageal reflux disease)   . Hypertension   . Insomnia   . Low testosterone    Family History  Problem Relation Age of Onset  . Lung cancer Father     deceased   Past Surgical History:  Procedure Laterality Date  . NASAL SINUS SURGERY     Dr. Carlis Abbott  . Unremarkable     Social History   Social History  . Marital status: Married    Spouse name: N/A  . Number of children: N/A  . Years of education: N/A   Social History Main Topics  . Smoking status: Former Smoker    Types: Cigarettes  . Smokeless tobacco: Never Used  . Alcohol use No  . Drug use: Unknown  . Sexual activity: Not Asked   Other Topics Concern  . None   Social History Narrative   Lives in [place] with wife      Work - Chief of Staff    Review of Systems  Constitutional: Negative for activity change, appetite change, chills, fatigue, fever and unexpected weight change.  Eyes: Negative for visual disturbance.  Respiratory: Negative for cough and shortness of breath.   Cardiovascular: Negative for chest pain, palpitations and leg swelling.  Gastrointestinal: Negative for abdominal distention and abdominal pain.  Genitourinary: Negative for difficulty urinating, dysuria and urgency.  Musculoskeletal: Negative for arthralgias and gait problem.  Skin: Negative for  color change and rash.  Hematological: Negative for adenopathy.  Psychiatric/Behavioral: Negative for agitation, behavioral problems, dysphoric mood and sleep disturbance. The patient is not nervous/anxious.        Objective:    BP (!) 144/80 (BP Location: Left Arm, Patient Position: Sitting, Cuff Size: Large)   Pulse 73   Ht 5\' 11"  (1.803 m)   Wt 188 lb 3.2 oz (85.4 kg)   SpO2 98%   BMI 26.25 kg/m  Physical Exam  Constitutional: He is oriented to person, place, and time. He appears well-developed and well-nourished. No distress.  HENT:  Head: Normocephalic and atraumatic.  Right Ear: External ear normal.  Left Ear: External ear normal.  Nose: Nose normal.  Mouth/Throat: Oropharynx is clear and moist. No oropharyngeal exudate.  Eyes: Conjunctivae and EOM are normal. Pupils are equal, round, and reactive to light. Right eye exhibits no discharge. Left eye exhibits no discharge. No scleral icterus.  Neck: Normal range of motion. Neck supple. No tracheal deviation present. No thyromegaly present.  Cardiovascular: Normal rate, regular rhythm and normal heart sounds.  Exam reveals no gallop and no friction rub.   No murmur heard. Pulmonary/Chest: Effort normal and breath sounds normal. No accessory muscle usage. No tachypnea. No respiratory distress. He has no decreased breath sounds. He has no wheezes. He has no rhonchi. He has no rales.  He exhibits no tenderness.  Musculoskeletal: Normal range of motion. He exhibits no edema.  Lymphadenopathy:    He has no cervical adenopathy.  Neurological: He is alert and oriented to person, place, and time. No cranial nerve deficit. Coordination normal.  Skin: Skin is warm and dry. No rash noted. He is not diaphoretic. No erythema. No pallor.  Psychiatric: He has a normal mood and affect. His behavior is normal. Judgment and thought content normal.          Assessment & Plan:   Problem List Items Addressed This Visit      Unprioritized    Generalized anxiety disorder - Primary    Symptoms much improved with Lexapro 10mg  and Clonazepam 0.5mg  tidprn. Will continue. Follow up with new provider in 3 months and prn.       Other Visit Diagnoses   None.      Return in about 3 months (around 11/01/2015) for New Patient.  Ronette Deter, MD [facility]

## 2015-08-01 NOTE — Assessment & Plan Note (Signed)
Symptoms much improved with Lexapro 10mg  and Clonazepam 0.5mg  tidprn. Will continue. Follow up with new provider in 3 months and prn.

## 2015-08-12 ENCOUNTER — Emergency Department: Payer: Managed Care, Other (non HMO)

## 2015-08-12 ENCOUNTER — Encounter: Payer: Self-pay | Admitting: Emergency Medicine

## 2015-08-12 ENCOUNTER — Emergency Department
Admission: EM | Admit: 2015-08-12 | Discharge: 2015-08-12 | Disposition: A | Discharge status: Home / Self Care | Payer: Managed Care, Other (non HMO) | Attending: Emergency Medicine | Admitting: Emergency Medicine

## 2015-08-12 DIAGNOSIS — Y929 Unspecified place or not applicable: Secondary | ICD-10-CM | POA: Diagnosis not present

## 2015-08-12 DIAGNOSIS — S39012A Strain of muscle, fascia and tendon of lower back, initial encounter: Secondary | ICD-10-CM | POA: Diagnosis not present

## 2015-08-12 DIAGNOSIS — Z79899 Other long term (current) drug therapy: Secondary | ICD-10-CM | POA: Insufficient documentation

## 2015-08-12 DIAGNOSIS — Z87891 Personal history of nicotine dependence: Secondary | ICD-10-CM | POA: Insufficient documentation

## 2015-08-12 DIAGNOSIS — S29011A Strain of muscle and tendon of front wall of thorax, initial encounter: Secondary | ICD-10-CM | POA: Diagnosis not present

## 2015-08-12 DIAGNOSIS — Z791 Long term (current) use of non-steroidal anti-inflammatories (NSAID): Secondary | ICD-10-CM | POA: Insufficient documentation

## 2015-08-12 DIAGNOSIS — Y9389 Activity, other specified: Secondary | ICD-10-CM | POA: Insufficient documentation

## 2015-08-12 DIAGNOSIS — Y999 Unspecified external cause status: Secondary | ICD-10-CM | POA: Diagnosis not present

## 2015-08-12 DIAGNOSIS — Z7982 Long term (current) use of aspirin: Secondary | ICD-10-CM | POA: Diagnosis not present

## 2015-08-12 DIAGNOSIS — R0789 Other chest pain: Secondary | ICD-10-CM | POA: Diagnosis present

## 2015-08-12 DIAGNOSIS — S29019A Strain of muscle and tendon of unspecified wall of thorax, initial encounter: Secondary | ICD-10-CM

## 2015-08-12 DIAGNOSIS — I10 Essential (primary) hypertension: Secondary | ICD-10-CM | POA: Diagnosis not present

## 2015-08-12 DIAGNOSIS — X500XXA Overexertion from strenuous movement or load, initial encounter: Secondary | ICD-10-CM | POA: Insufficient documentation

## 2015-08-12 MED ORDER — KETOROLAC TROMETHAMINE 10 MG PO TABS: 10.0000 mg | ORAL_TABLET | Freq: Three times a day (TID) | ORAL | 0 refills | Status: DC

## 2015-08-12 MED ORDER — ORPHENADRINE CITRATE 30 MG/ML IJ SOLN
60.0000 mg | INTRAMUSCULAR | Status: AC
  Administered 2015-08-12: 60 mg via INTRAMUSCULAR
  Filled 2015-08-12: qty 2

## 2015-08-12 MED ORDER — ORPHENADRINE CITRATE ER 100 MG PO TB12: 100.0000 mg | ORAL_TABLET | Freq: Two times a day (BID) | ORAL | 0 refills | Status: DC | PRN

## 2015-08-12 MED ORDER — KETOROLAC TROMETHAMINE 60 MG/2ML IM SOLN
30.0000 mg | Freq: Once | INTRAMUSCULAR | Status: AC
  Administered 2015-08-12: 30 mg via INTRAMUSCULAR
  Filled 2015-08-12: qty 2

## 2015-08-12 NOTE — ED Provider Notes (Signed)
[facility] Emergency Department Provider Note ____________________________________________  Time seen: 1946  I have reviewed the triage vital signs and the nursing notes.  HISTORY  Chief Complaint  Chest Pain and Flank Pain  HPI Gregory DAILY Sr. is a 50 y.o. male presents to the ED with a 2 day complaint of right-sided chest wall and rib pain. The patient reports initial onset about 2 days prior while lifting weights. He is manage the pain with his daily pain medicines, but noticed today it worsened sharply when he coughed. He describes this sharp, stabbing pain when he attempts to lower his right arm below shoulder level. Patient presents with his right arm resting on the top of his head. He denies any interim fevers, chills, sweats. He denies any direct chest trauma.He rates his current pain at a 10/10 in triage.  Past Medical History:  Diagnosis Date  . Anxiety   . GERD (gastroesophageal reflux disease)   . Hypertension   . Insomnia   . Low testosterone     Patient Active Problem List   Diagnosis Date Noted  . Skin lesion 04/14/2013  . Chronic low back pain 04/14/2013  . Generalized anxiety disorder 08/04/2012  . Hypogonadism male 10/24/2011  . Hypertension 09/24/2011  . GERD (gastroesophageal reflux disease) 09/24/2011  . Erectile dysfunction 09/24/2011    Past Surgical History:  Procedure Laterality Date  . NASAL SINUS SURGERY     Dr. Carlis Abbott  . Unremarkable      Prior to Admission medications   Medication Sig Start Date End Date Taking? Authorizing Provider  aspirin 81 MG tablet Take 81 mg by mouth daily.    Historical Provider, MD  clonazePAM (KLONOPIN) 0.5 MG tablet Take 1 tablet (0.5 mg total) by mouth 3 (three) times daily as needed for anxiety. 08/01/15   Jackolyn Confer, MD  escitalopram (LEXAPRO) 10 MG tablet Take 1 tablet (10 mg total) by mouth daily. 07/03/15   Jackolyn Confer, MD  gabapentin (NEURONTIN) 300 MG capsule Take 300 mg  by mouth 3 (three) times daily.    Historical Provider, MD  ketorolac (TORADOL) 10 MG tablet Take 1 tablet (10 mg total) by mouth every 8 (eight) hours. 08/12/15   Patsie Mccardle V Bacon Ruxin Ransome, PA-C  lisinopril (PRINIVIL,ZESTRIL) 5 MG tablet Take 1 tablet (5 mg total) by mouth daily. 01/30/15   Jackolyn Confer, MD  magnesium oxide (MAG-OX) 400 MG tablet Take 400 mg by mouth daily.    Historical Provider, MD  meloxicam (MOBIC) 15 MG tablet TAKE 1 TABLET BY MOUTH EVERY DAY 05/08/12   Jackolyn Confer, MD  Multiple Vitamin (MULTIVITAMIN) tablet Take 1 tablet by mouth daily.    Historical Provider, MD  omeprazole (PRILOSEC) 40 MG capsule TAKE 1 CAPSULE (40 MG TOTAL) BY MOUTH DAILY. 07/28/15   Jackolyn Confer, MD  orphenadrine (NORFLEX) 100 MG tablet Take 1 tablet (100 mg total) by mouth 2 (two) times daily as needed for muscle spasms. 08/12/15   Stephan Draughn V Bacon Skylynne Schlechter, PA-C  Oxycodone HCl 10 MG TABS Take by mouth. Take one 3 to 5 times a day    Historical Provider, MD  sildenafil (VIAGRA) 100 MG tablet Take 50 mg by mouth daily as needed.    Historical Provider, MD  testosterone cypionate (DEPOTESTOTERONE CYPIONATE) 100 MG/ML injection Inject 100 mg into the muscle every 14 (fourteen) days. For IM use only    Historical Provider, MD  tiZANidine (ZANAFLEX) 4 MG capsule Take 4 mg by  mouth 3 (three) times daily.    Historical Provider, MD    Allergies Review of patient's allergies indicates no known allergies.  Family History  Problem Relation Age of Onset  . Lung cancer Father     deceased    Social History Social History  Substance Use Topics  . Smoking status: Former Smoker    Types: Cigarettes  . Smokeless tobacco: Never Used  . Alcohol use No    Review of Systems  Constitutional: Negative for fever. Cardiovascular: Negative for chest pain. Respiratory: Negative for shortness of breath. Gastrointestinal: Negative for abdominal pain, vomiting and diarrhea. Genitourinary: Negative for  dysuria. Musculoskeletal: Negative for back pain. Reports right chest wall pain. Skin: Negative for rash. Neurological: Negative for headaches, focal weakness or numbness. ____________________________________________  PHYSICAL EXAM:  VITAL SIGNS: ED Triage Vitals  Enc Vitals Group     BP 08/12/15 1909 (!) 175/110     Pulse Rate 08/12/15 1909 73     Resp 08/12/15 1909 20     Temp 08/12/15 1909 97.6 F (36.4 C)     Temp Source 08/12/15 1909 Oral     SpO2 08/12/15 1909 94 %     Weight 08/12/15 1909 190 lb (86.2 kg)     Height 08/12/15 1909 5\' 11"  (1.803 m)     Head Circumference --      Peak Flow --      Pain Score 08/12/15 1905 10     Pain Loc --      Pain Edu? --      Excl. in [place]? --     Constitutional: Alert and oriented. Well appearing and in no distress. Head: Normocephalic and atraumatic. Cardiovascular: Normal rate, regular rhythm.  Respiratory: Normal respiratory effort. No wheezes/rales/rhonchi. Gastrointestinal: Soft and nontender. No distention. Musculoskeletal: Chest wall without any obvious deformity, swelling, bruising, ecchymosis, or abrasion. Patient localizes pain to the mid axillary line overlying the ninth and 10th ribs. Nontender with normal range of motion in all extremities.  Neurologic:  Normal gait without ataxia. Normal speech and language. No gross focal neurologic deficits are appreciated. Skin:  Skin is warm, dry and intact. No rash noted. ____________________________________________   RADIOLOGY  Right Rib Detail IMPRESSION: Negative.  I, Luby Seamans, Dannielle Karvonen, personally viewed and evaluated these images (plain radiographs) as part of my medical decision making, as well as reviewing the written report by the radiologist. ____________________________________________  PROCEDURES  Toradol 30 mg IM Norflex 60 mg IM ____________________________________________  INITIAL IMPRESSION / ASSESSMENT AND PLAN / ED COURSE  Patient with acute  thoracic muscle strain and mild fascial spasms the chest wall. No radiologic evidence of acute fracture or cardiopulmonary process. Patient is discharged with pain reduced following IM medication administration. He will follow up with primary care provider and dose his home medications for pain as needed. He is provided with patient options for Toradol and Norflex to dose as directed.  Clinical Course   ____________________________________________  FINAL CLINICAL IMPRESSION(S) / ED DIAGNOSES  Final diagnoses:  Chest wall pain  Thoracic myofascial strain, initial encounter      Melvenia Needles, PA-C 08/13/15 0008    Earleen Newport, MD 08/16/15 [phone]

## 2015-08-12 NOTE — Discharge Instructions (Signed)
Take the prescription meds as directed. Apply ice and/or moist heat to any sore muscles Follow-up with Dr. Gilford Rile for continued symptoms.

## 2015-08-12 NOTE — ED Notes (Signed)
Pt. Verbalizes understanding of d/c instructions, prescriptions, and follow-up. VS stable and pain decreased per pt report.  Pt. In NAD at time of d/c and denies further concerns regarding today's visit. Pt. Ambulatory Out of the unit with steady gait with family. E-Sig pad not working at time of d/c. Pt. Verbalized confirmation to document his consent to be d/c and follow-up. Pt. Encouraged to return to ED with any worsening or emergent symptoms.

## 2015-08-12 NOTE — ED Notes (Signed)
Pt. Stating "I just want to leave, no need to stay here and waste y'all's time. I just need a note for Monday and don't want to burn any bridges for when I come back next time"  Pt. Educated on [place] process and that a patient is always treated as a patient when entering the ED no matter how visits have went in the past.

## 2015-08-12 NOTE — ED Triage Notes (Signed)
Patient arrives to [facility] ED via POV with complaint of right rib/side pain. Patient presents with his right arm above and crossed over his head. Patient states "I coughed and felt a pop"  Patient has had this pain for two days s/p lifting weights. However, it worsened today when he coughed. Patient states that he cannot lower his arm because it feels like he is being stabbed. Equal breath sounds noted bilaterally, negative tracheal deviation, negative flail chest. +Pain to palp

## 2015-08-12 NOTE — ED Notes (Signed)
Pt. Verbalizes understanding of AMA process and has decided against signing so that he can receive a work note for Monday at discharge.

## 2015-08-12 NOTE — ED Notes (Signed)
Called pharmacy to request medication sent to ED, stated tubing medication at this time.

## 2015-11-02 ENCOUNTER — Ambulatory Visit (INDEPENDENT_AMBULATORY_CARE_PROVIDER_SITE_OTHER): Payer: Managed Care, Other (non HMO) | Admitting: Family Medicine

## 2015-11-02 ENCOUNTER — Encounter: Payer: Self-pay | Admitting: Family Medicine

## 2015-11-02 VITALS — BP 136/82 | HR 64 | Temp 98.4°F | Wt 194.2 lb

## 2015-11-02 DIAGNOSIS — F411 Generalized anxiety disorder: Secondary | ICD-10-CM | POA: Diagnosis not present

## 2015-11-02 DIAGNOSIS — R0789 Other chest pain: Secondary | ICD-10-CM | POA: Insufficient documentation

## 2015-11-02 DIAGNOSIS — I10 Essential (primary) hypertension: Secondary | ICD-10-CM

## 2015-11-02 DIAGNOSIS — R5383 Other fatigue: Secondary | ICD-10-CM | POA: Diagnosis not present

## 2015-11-02 DIAGNOSIS — E291 Testicular hypofunction: Secondary | ICD-10-CM

## 2015-11-02 LAB — COMPREHENSIVE METABOLIC PANEL
ALK PHOS: 64 U/L (ref 39–117)
ALT: 17 U/L (ref 0–53)
AST: 18 U/L (ref 0–37)
Albumin: 4.5 g/dL (ref 3.5–5.2)
BILIRUBIN TOTAL: 0.4 mg/dL (ref 0.2–1.2)
BUN: 16 mg/dL (ref 6–23)
CO2: 29 meq/L (ref 19–32)
CREATININE: 1.22 mg/dL (ref 0.40–1.50)
Calcium: 9.8 mg/dL (ref 8.4–10.5)
Chloride: 105 mEq/L (ref 96–112)
GFR: 66.71 mL/min (ref >60.00)
GLUCOSE: 92 mg/dL (ref 70–99)
Potassium: 4.8 mEq/L (ref 3.5–5.1)
SODIUM: 140 meq/L (ref 135–145)
TOTAL PROTEIN: 7 g/dL (ref 6.0–8.3)

## 2015-11-02 LAB — CBC
HEMATOCRIT: 49.3 % (ref 39.0–52.0)
Hemoglobin: 16.9 g/dL (ref 13.0–17.0)
MCHC: 34.4 g/dL (ref 30.0–36.0)
MCV: 89.1 fl (ref 78.0–100.0)
Platelets: 280 10*3/uL (ref 150.0–400.0)
RBC: 5.53 Mil/uL (ref 4.22–5.81)
RDW: 12.9 % (ref 11.5–15.5)
WBC: 11.7 10*3/uL — AB (ref 4.0–10.5)

## 2015-11-02 NOTE — Assessment & Plan Note (Signed)
Patient's description of chest pain is very atypical in that it is pinpoint, sharp, not associated with radiation, not associated with diaphoresis, and not associated with dyspnea. Asymptomatic at this time. EKG today is reassuring. Doubt underlying pulmonary cause. Suspect musculoskeletal cause. He'll continue to monitor. He is given return precautions.

## 2015-11-02 NOTE — Assessment & Plan Note (Signed)
Continues to have issues with this. Not interested in changing his medication regimen at this time. He will start exercising and see if this is beneficial. If not he'll let us know.

## 2015-11-02 NOTE — Assessment & Plan Note (Signed)
At goal. Continue current medications. 

## 2015-11-02 NOTE — Progress Notes (Signed)
[name], MD Phone: [phone]  Gregory Drown Sr. is a 50 y.o. male who presents today for follow-up.  Hypogonadism: Patient is on testosterone supplementation through urology. He takes this every other week. Notes his energy has been lower recently. Just doesn't have the get up and go. Occasionally feels as though he has to take a nap after being active. He notes no chest pressure. He feels as though his testosterone may be low or his hemoglobin may be up.  Sharp chest discomfort is noted by the patient at times. Occasionally dull. He is able to point with one finger to the area that this occurs in the left costochondral joint area. He notes no shortness of breath, radiation, or diaphoresis with this. Can occur when he is at rest. Does not worsen with exertion. No cardiac history. No pain at this time.  Anxiety: Patient notes he still struggles with this. He gets overwhelmed easily. He is taking Lexapro. He is taking Klonopin 1-3 times a day. Occasionally feels as though he has panic attacks with feeling anxious very quickly and then having the sensation that he can't catch his breath. He notes no depression.  HYPERTENSION  Disease Monitoring  Home BP Monitoring 130s over 80s Chest pain- see above  Dyspnea- see above Medications  Compliance-  taking lisinopril.  Edema- no  PMH: Former smoker   ROS see history of present illness  Objective  Physical Exam Vitals:   11/02/15 1400  BP: 136/82  Pulse: 64  Temp: 98.4 F (36.9 C)    BP Readings from Last 3 Encounters:  11/02/15 136/82  08/12/15 139/82  08/01/15 (!) 144/80   Wt Readings from Last 3 Encounters:  11/02/15 194 lb 3.2 oz (88.1 kg)  08/12/15 190 lb (86.2 kg)  08/01/15 188 lb 3.2 oz (85.4 kg)    Physical Exam  Constitutional: He is well-developed, well-nourished, and in no distress.  Cardiovascular: Normal rate, regular rhythm and normal heart sounds.   Pulmonary/Chest: Effort normal and breath sounds  normal. He exhibits no tenderness.  Musculoskeletal: He exhibits no edema.  Neurological: He is alert. Gait normal.  Skin: Skin is warm and dry.  Psychiatric:  Mood anxious, affect anxious   EKG: Normal sinus rhythm, rate 62, only apparent change from prior EKG is upright T wave in V1, otherwise no ST or T-wave changes  Assessment/Plan: Please see individual problem list.  Hypertension At goal. Continue current medications.  Generalized anxiety disorder Continues to have issues with this. Not interested in changing his medication regimen at this time. He will start exercising and see if this is beneficial. If not he'll let us know.  Atypical chest pain Patient's description of chest pain is very atypical in that it is pinpoint, sharp, not associated with radiation, not associated with diaphoresis, and not associated with dyspnea. Asymptomatic at this time. EKG today is reassuring. Doubt underlying pulmonary cause. Suspect musculoskeletal cause. He'll continue to monitor. He is given return precautions.  Hypogonadism male Patient concerned about his hemoglobin as it has been elevated on testosterone supplementation previously. He also wanted testosterone testing though I advised that he would have to return for the testosterone testing. He noted he would get this done through his urologist. We will check his hemoglobin today. Given that he is also complaining of tiredness we will check a TSH and CMP as well to rule out other causes.   Orders Placed This Encounter  Procedures  . CBC  . TSH  . Comp Met (  CMET)  . EKG 12-Lead    [name], MD [place]

## 2015-11-02 NOTE — Progress Notes (Signed)
Pre visit review using our clinic review tool, if applicable. No additional management support is needed unless otherwise documented below in the visit note. 

## 2015-11-02 NOTE — Patient Instructions (Signed)
Nice to see you. Please start exercising and see if this will help your anxiety. If it does not please let us know and we can advise you on how to go up on your Lexapro. We will check some lab work today and call you with the results. If you develop chest pressure, shortness of breath, sweatiness, or any new or changing symptoms please seek medical attention immediately.

## 2015-11-02 NOTE — Assessment & Plan Note (Addendum)
Patient concerned about his hemoglobin as it has been elevated on testosterone supplementation previously. He also wanted testosterone testing though I advised that he would have to return for the testosterone testing. He noted he would get this done through his urologist. We will check his hemoglobin today. Given that he is also complaining of tiredness we will check a TSH and CMP as well to rule out other causes.

## 2015-11-06 LAB — TSH: TSH: 0.92 u[IU]/mL (ref 0.35–4.50)

## 2015-11-17 ENCOUNTER — Telehealth: Payer: Self-pay

## 2015-11-17 DIAGNOSIS — D72829 Elevated white blood cell count, unspecified: Secondary | ICD-10-CM

## 2015-11-17 NOTE — Telephone Encounter (Signed)
Order placed

## 2015-11-17 NOTE — Telephone Encounter (Signed)
Pt coming for repeat labs 11/20/15. Please place future order. Thank you.

## 2015-11-20 ENCOUNTER — Other Ambulatory Visit: Payer: Managed Care, Other (non HMO)

## 2015-11-21 ENCOUNTER — Other Ambulatory Visit (INDEPENDENT_AMBULATORY_CARE_PROVIDER_SITE_OTHER): Payer: Managed Care, Other (non HMO)

## 2015-11-21 DIAGNOSIS — D72829 Elevated white blood cell count, unspecified: Secondary | ICD-10-CM

## 2015-11-21 LAB — CBC WITH DIFFERENTIAL/PLATELET
BASOS PCT: 0.6 % (ref 0.0–3.0)
Basophils Absolute: 0.1 10*3/uL (ref 0.0–0.1)
Eosinophils Absolute: 0.6 10*3/uL (ref 0.0–0.7)
Eosinophils Relative: 6.1 % — ABNORMAL HIGH (ref 0.0–5.0)
HEMATOCRIT: 48.9 % (ref 39.0–52.0)
HEMOGLOBIN: 16.6 g/dL (ref 13.0–17.0)
LYMPHS PCT: 27.7 % (ref 12.0–46.0)
Lymphs Abs: 2.9 10*3/uL (ref 0.7–4.0)
MCHC: 34.1 g/dL (ref 30.0–36.0)
MCV: 88.5 fl (ref 78.0–100.0)
MONOS PCT: 6.8 % (ref 3.0–12.0)
Monocytes Absolute: 0.7 10*3/uL (ref 0.1–1.0)
Neutro Abs: 6.1 10*3/uL (ref 1.4–7.7)
Neutrophils Relative %: 58.8 % (ref 43.0–77.0)
Platelets: 269 10*3/uL (ref 150.0–400.0)
RBC: 5.53 Mil/uL (ref 4.22–5.81)
RDW: 12.9 % (ref 11.5–15.5)
WBC: 10.4 10*3/uL (ref 4.0–10.5)

## 2015-12-29 ENCOUNTER — Other Ambulatory Visit: Payer: Self-pay

## 2015-12-29 MED ORDER — ESCITALOPRAM OXALATE 10 MG PO TABS: 10.0000 mg | ORAL_TABLET | Freq: Every day | ORAL | 1 refills | Status: DC

## 2015-12-29 NOTE — Telephone Encounter (Signed)
Last filled 07/03/2015 90 1rf

## 2015-12-29 NOTE — Telephone Encounter (Signed)
Sent to pharmacy 

## 2016-01-29 ENCOUNTER — Other Ambulatory Visit: Payer: Self-pay

## 2016-01-29 MED ORDER — LISINOPRIL 5 MG PO TABS: 5.0000 mg | ORAL_TABLET | Freq: Every day | ORAL | 3 refills | Status: DC

## 2016-01-29 NOTE — Telephone Encounter (Signed)
Last filled by Dr.Walker 01/30/15 90 3rf

## 2016-02-02 ENCOUNTER — Ambulatory Visit: Payer: Managed Care, Other (non HMO) | Admitting: Family Medicine

## 2016-03-19 ENCOUNTER — Encounter: Payer: Self-pay | Admitting: Family Medicine

## 2016-03-20 NOTE — Telephone Encounter (Signed)
Patients wife states he is unable to come in before 2pm and we do not have any openings, she states he has a follow up on Monday and will just mention it then

## 2016-03-25 ENCOUNTER — Ambulatory Visit: Payer: Managed Care, Other (non HMO) | Admitting: Family Medicine

## 2016-03-25 ENCOUNTER — Other Ambulatory Visit: Payer: Self-pay | Admitting: Internal Medicine

## 2016-03-25 NOTE — Telephone Encounter (Signed)
Prescription filled. Patient needs a follow-up visit. Thanks.

## 2016-03-25 NOTE — Telephone Encounter (Signed)
Refilled 08/01/2015 Last OV: 11/02/2015 Next OV: not scheduled

## 2016-03-26 NOTE — Telephone Encounter (Signed)
rx faxed, patient scheduled for follow up

## 2016-04-15 ENCOUNTER — Telehealth: Payer: Self-pay | Admitting: *Deleted

## 2016-04-15 ENCOUNTER — Ambulatory Visit (INDEPENDENT_AMBULATORY_CARE_PROVIDER_SITE_OTHER): Payer: Managed Care, Other (non HMO) | Admitting: Family Medicine

## 2016-04-15 ENCOUNTER — Encounter: Payer: Self-pay | Admitting: Family Medicine

## 2016-04-15 VITALS — BP 148/102 | HR 67 | Temp 98.7°F | Wt 187.0 lb

## 2016-04-15 DIAGNOSIS — I10 Essential (primary) hypertension: Secondary | ICD-10-CM | POA: Diagnosis not present

## 2016-04-15 DIAGNOSIS — F411 Generalized anxiety disorder: Secondary | ICD-10-CM | POA: Diagnosis not present

## 2016-04-15 DIAGNOSIS — R21 Rash and other nonspecific skin eruption: Secondary | ICD-10-CM | POA: Diagnosis not present

## 2016-04-15 DIAGNOSIS — R0789 Other chest pain: Secondary | ICD-10-CM

## 2016-04-15 DIAGNOSIS — R252 Cramp and spasm: Secondary | ICD-10-CM | POA: Insufficient documentation

## 2016-04-15 MED ORDER — ALPRAZOLAM 0.25 MG PO TABS: 0.2500 mg | ORAL_TABLET | Freq: Three times a day (TID) | ORAL | 1 refills | Status: DC | PRN

## 2016-04-15 MED ORDER — LISINOPRIL 10 MG PO TABS: 10.0000 mg | ORAL_TABLET | Freq: Every day | ORAL | 1 refills | Status: DC

## 2016-04-15 MED ORDER — TRIAMCINOLONE ACETONIDE 0.1 % EX CREA: 1.0000 "application " | TOPICAL_CREAM | Freq: Two times a day (BID) | CUTANEOUS | 0 refills | Status: DC

## 2016-04-15 NOTE — Progress Notes (Signed)
Pre visit review using our clinic review tool, if applicable. No additional management support is needed unless otherwise documented below in the visit note. 

## 2016-04-15 NOTE — Telephone Encounter (Signed)
Patient states he used to be on xanax 0.5 tid, he wanted to know why he is being prescribed 0.25mg 

## 2016-04-15 NOTE — Assessment & Plan Note (Signed)
Klonopin is making him drowsy to some degree. We'll switch him back to Xanax as he tolerated this better previously. We'll continue Lexapro.

## 2016-04-15 NOTE — Assessment & Plan Note (Signed)
Uncontrolled. We'll increase lisinopril to 10 mg daily. We'll check lab work today and in 1 week. He'll follow-up with me in 1 week for recheck.
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
## 2016-04-15 NOTE — Assessment & Plan Note (Signed)
Chest pain most consistent with musculoskeletal cause given that it occurs with particular movements and the description of pinpoint sharpness. Did discuss obtaining an additional EKG given his risk factor though he declined this. Seems unlikely to be cardiac in nature. He wanted to continue to monitor for now. Given return precautions.

## 2016-04-15 NOTE — Assessment & Plan Note (Signed)
Fairly nonspecific rash on his right lower extremity. We'll trial triamcinolone to see if this will be beneficial.

## 2016-04-15 NOTE — Patient Instructions (Signed)
Nice to see you. We will increase your lisinopril for your blood pressure. We will change you from Klonopin to Xanax as that was more tolerable. If you become drowsy with this please let us know. Please try the steroid cream on your leg for the rash and itching. We'll check some lab work and contact you with the results. If you develop chest pain, shortness of breath, or any new or changing symptoms please seek medical attention immediately.

## 2016-04-15 NOTE — Telephone Encounter (Signed)
Patient requested a call discuss his Rx for alprazolam  Pt contact [phone]

## 2016-04-15 NOTE — Assessment & Plan Note (Signed)
Patient notes cramps in his right distal lower extremity and around his ankle on the right side. We'll check lab work. I encouraged hydration. He reports he is not drinking that much water.

## 2016-04-16 ENCOUNTER — Telehealth: Payer: Self-pay | Admitting: *Deleted

## 2016-04-16 LAB — COMPREHENSIVE METABOLIC PANEL
ALK PHOS: 66 U/L (ref 39–117)
ALT: 15 U/L (ref 0–53)
AST: 17 U/L (ref 0–37)
Albumin: 4.3 g/dL (ref 3.5–5.2)
BILIRUBIN TOTAL: 0.3 mg/dL (ref 0.2–1.2)
BUN: 13 mg/dL (ref 6–23)
CO2: 31 meq/L (ref 19–32)
CREATININE: 1.23 mg/dL (ref 0.40–1.50)
Calcium: 9.4 mg/dL (ref 8.4–10.5)
Chloride: 105 mEq/L (ref 96–112)
GFR: 65.97 mL/min (ref >60.00)
GLUCOSE: 89 mg/dL (ref 70–99)
Potassium: 4.4 mEq/L (ref 3.5–5.1)
SODIUM: 140 meq/L (ref 135–145)
TOTAL PROTEIN: 7 g/dL (ref 6.0–8.3)

## 2016-04-16 LAB — MAGNESIUM: Magnesium: 2 mg/dL (ref 1.5–2.5)

## 2016-04-16 MED ORDER — ALPRAZOLAM 0.5 MG PO TABS: 0.5000 mg | ORAL_TABLET | Freq: Three times a day (TID) | ORAL | 1 refills | Status: DC | PRN

## 2016-04-16 NOTE — Telephone Encounter (Signed)
We can change to xanax 0.5 mg TID prn as it appears this is what he was on previously. Please find out if he has filled the other prescription. Thanks.

## 2016-04-16 NOTE — Telephone Encounter (Signed)
Noted. Prescription printed and given to [facility].

## 2016-04-16 NOTE — Telephone Encounter (Signed)
Notified pharmacy that it is replacing the clonazepam

## 2016-04-16 NOTE — Telephone Encounter (Signed)
Patient has not filled the rx

## 2016-04-16 NOTE — Telephone Encounter (Signed)
[facility] requested to know if the Rx for alprazolam is replacing the clonazepam  Contact  [facility] [phone]

## 2016-04-22 ENCOUNTER — Ambulatory Visit (INDEPENDENT_AMBULATORY_CARE_PROVIDER_SITE_OTHER): Payer: Managed Care, Other (non HMO) | Admitting: Family Medicine

## 2016-04-22 ENCOUNTER — Encounter: Payer: Self-pay | Admitting: Family Medicine

## 2016-04-22 VITALS — BP 152/90 | HR 72 | Temp 97.9°F | Wt 184.8 lb

## 2016-04-22 DIAGNOSIS — F411 Generalized anxiety disorder: Secondary | ICD-10-CM | POA: Diagnosis not present

## 2016-04-22 DIAGNOSIS — G4710 Hypersomnia, unspecified: Secondary | ICD-10-CM | POA: Diagnosis not present

## 2016-04-22 DIAGNOSIS — I10 Essential (primary) hypertension: Secondary | ICD-10-CM | POA: Diagnosis not present

## 2016-04-22 DIAGNOSIS — R21 Rash and other nonspecific skin eruption: Secondary | ICD-10-CM | POA: Diagnosis not present

## 2016-04-22 LAB — BASIC METABOLIC PANEL
BUN: 16 mg/dL (ref 6–23)
CALCIUM: 9.7 mg/dL (ref 8.4–10.5)
CO2: 29 mEq/L (ref 19–32)
CREATININE: 1.15 mg/dL (ref 0.40–1.50)
Chloride: 104 mEq/L (ref 96–112)
GFR: 71.29 mL/min (ref >60.00)
Glucose, Bld: 89 mg/dL (ref 70–99)
Potassium: 4.4 mEq/L (ref 3.5–5.1)
SODIUM: 139 meq/L (ref 135–145)

## 2016-04-22 MED ORDER — LISINOPRIL 10 MG PO TABS: 20.0000 mg | ORAL_TABLET | Freq: Every day | ORAL | 1 refills | Status: DC

## 2016-04-22 NOTE — Assessment & Plan Note (Signed)
Improved with triamcinolone. Continue to monitor.

## 2016-04-22 NOTE — Assessment & Plan Note (Signed)
Still above goal. Discussed option of adding an additional medicine versus going up on his lisinopril. He opted to increase the dose. We will increase his dose to 20 mg. We have blood work today and in one week. Nurse visit in 1 week for recheck of blood pressure.

## 2016-04-22 NOTE — Assessment & Plan Note (Signed)
Patient with increased sleepiness and tiredness throughout the day. Discussed obtaining sleep study given concern for sleep apnea with snoring and hypersomnia though he deferred. He feels as though this is related to age and his work schedule. Advised to monitor this and we can consider sleep study in the future.

## 2016-04-22 NOTE — Progress Notes (Signed)
Pre visit review using our clinic review tool, if applicable. No additional management support is needed unless otherwise documented below in the visit note. 

## 2016-04-22 NOTE — Progress Notes (Signed)
  [name], MD Phone: [phone]  Gregory Drown Sr. is a 51 y.o. male who presents today for f/u.  HYPERTENSION  Disease Monitoring  Home BP Monitoring not checking Chest pain- no    Dyspnea- no Medications  Compliance-  Taking lisinopril 10 mg  Edema- no  He notes the triamcinolone helped with the dry patches on his lower legs. These have almost completely gone away.  He notes his anxiety is better controlled with Xanax and he is less drowsy with this. He does note some chronic tiredness for some time now. He feels as though he could just fall asleep at times during the if he is just sitting there. Notes no physical exertion issues. No chest pain since he was previously diagnosed with musculoskeletal chest pain. He sleeps from 10 PM to 4 AM. He does snore. No apnea. Reports he had a sleep study previously and he reports he did not have sleep apnea. He does note he wakes up more well refreshed now that he is back on Xanax.  PMH: Former smoker   ROS see history of present illness  Objective  Physical Exam Vitals:   04/22/16 1432  BP: (!) 152/90  Pulse: 72  Temp: 97.9 F (36.6 C)    BP Readings from Last 3 Encounters:  04/22/16 (!) 152/90  04/15/16 (!) 148/102  11/02/15 136/82   Wt Readings from Last 3 Encounters:  04/22/16 184 lb 12.8 oz (83.8 kg)  04/15/16 187 lb (84.8 kg)  11/02/15 194 lb 3.2 oz (88.1 kg)    Physical Exam  Constitutional: No distress.  Cardiovascular: Normal rate, regular rhythm and normal heart sounds.   Pulmonary/Chest: Effort normal and breath sounds normal.  Neurological: He is alert. Gait normal.  Skin: Skin is warm and dry. He is not diaphoretic.   rash on right lower extremity significantly improved with minimal erythema   Assessment/Plan: Please see individual problem list.  Hypertension Still above goal. Discussed option of adding an additional medicine versus going up on his lisinopril. He opted to increase the dose. We will  increase his dose to 20 mg. We have blood work today and in one week. Nurse visit in 1 week for recheck of blood pressure.  Rash and nonspecific skin eruption Improved with triamcinolone. Continue to monitor.  Generalized anxiety disorder Less drowsiness with Xanax. Continue this and Lexapro.  Hypersomnia Patient with increased sleepiness and tiredness throughout the day. Discussed obtaining sleep study given concern for sleep apnea with snoring and hypersomnia though he deferred. He feels as though this is related to age and his work schedule. Advised to monitor this and we can consider sleep study in the future.   Orders Placed This Encounter  Procedures  . Basic Metabolic Panel (BMET)    Meds ordered this encounter  Medications  . lisinopril (PRINIVIL,ZESTRIL) 10 MG tablet    Sig: Take 2 tablets (20 mg total) by mouth daily.    Dispense:  180 tablet    Refill:  [place], MD [place]

## 2016-04-22 NOTE — Assessment & Plan Note (Signed)
Less drowsiness with Xanax. Continue this and Lexapro.

## 2016-04-22 NOTE — Patient Instructions (Signed)
Nice to see you. We're going to increase your lisinopril to 20 mg. Please start checking your blood pressure at home. We will have you return in 1 week. Please try to get more sleep. If you develop any chest pain or trouble breathing please seek medical attention.

## 2016-05-01 ENCOUNTER — Telehealth: Payer: Self-pay | Admitting: *Deleted

## 2016-05-01 NOTE — Telephone Encounter (Signed)
Called patient to apologize for not being able to see him on arrival, patient stated I did not need to apologize that it was not my fault he had an emergency. Patient was glad to reschedule for Friday , he stated he never told anyone he was upset, he just had to leave.

## 2016-05-02 NOTE — Telephone Encounter (Signed)
Noted thanks °

## 2016-05-03 ENCOUNTER — Ambulatory Visit (INDEPENDENT_AMBULATORY_CARE_PROVIDER_SITE_OTHER): Payer: Managed Care, Other (non HMO)

## 2016-05-03 VITALS — BP 136/98 | HR 71 | Resp 16

## 2016-05-03 DIAGNOSIS — I10 Essential (primary) hypertension: Secondary | ICD-10-CM | POA: Diagnosis not present

## 2016-05-03 MED ORDER — LISINOPRIL 10 MG PO TABS: 20.0000 mg | ORAL_TABLET | Freq: Every day | ORAL | 1 refills | Status: DC

## 2016-05-03 NOTE — Progress Notes (Signed)
Pt presented today for a 1 week blood pressure re-check after increasing his Lisinopril to 20mg . When going over the patients current dose of lisinopril he stated that he has 5mg  tablets and he is taking 2 daily to equal 10mg  daily. The pt's blood pressure today is 140/90(left arm, normal cuff), pulse 68, O2 96 and 136/98(right arm, normal cuff), pulse 71, O2 96. Pt stated that he has surgery on Thursday. Spoke with Dr. Caryl Bis and he stated that the pt needs to start taking the 20mg  daily and return on Wednesday next week for a blood pressure re-check. Pt gave a verbal understanding to all instructions and medication changes.

## 2016-05-03 NOTE — Addendum Note (Signed)
Addended by: Leone Haven on: 05/03/2016 06:02 PM   Modules accepted: Orders

## 2016-05-03 NOTE — Progress Notes (Signed)
Agree. Patient should start on 20 mg of lisinopril. We will send in a new prescription for him. He will need lab work as well on Wednesday.  [name], M.D.

## 2016-05-07 NOTE — Progress Notes (Signed)
Pt was already aware of the medication increase and the bp rechk for Wednesday. Spoke with pt's wife to let her know that the pt will be having a lab drawn directly after he gets his bp checked. Wife stated that she would let the pt know.

## 2016-05-08 ENCOUNTER — Ambulatory Visit (INDEPENDENT_AMBULATORY_CARE_PROVIDER_SITE_OTHER): Payer: Managed Care, Other (non HMO) | Admitting: *Deleted

## 2016-05-08 ENCOUNTER — Encounter: Payer: Self-pay | Admitting: *Deleted

## 2016-05-08 ENCOUNTER — Other Ambulatory Visit: Payer: Managed Care, Other (non HMO)

## 2016-05-08 DIAGNOSIS — I10 Essential (primary) hypertension: Secondary | ICD-10-CM | POA: Diagnosis not present

## 2016-05-08 LAB — BASIC METABOLIC PANEL
BUN: 8 mg/dL (ref 6–23)
CHLORIDE: 104 meq/L (ref 96–112)
CO2: 28 mEq/L (ref 19–32)
Calcium: 9.5 mg/dL (ref 8.4–10.5)
Creatinine, Ser: 1.08 mg/dL (ref 0.40–1.50)
GFR: 76.63 mL/min (ref >60.00)
Glucose, Bld: 103 mg/dL — ABNORMAL HIGH (ref 70–99)
POTASSIUM: 4.1 meq/L (ref 3.5–5.1)
Sodium: 139 mEq/L (ref 135–145)

## 2016-05-08 NOTE — Progress Notes (Signed)
Patient presented for BP check left arm 140/98 pulse 90 , right arm 148/98 pulse 84, patient seemed very anxious stating he was losing his job, and that his insurance was running out and that he has surgery scheduled for 05/09/16 @ 1045, patient lisinopril increased to 20 mg daily by PCP on 04/22/16

## 2016-05-08 NOTE — Progress Notes (Signed)
Blood pressure still above goal. I would suggest follow-up in the office with myself for this. Advised LPN while patient was in the office that patient should have blood pressure check prior to surgery tomorrow and that it would be up to the anesthesiologist and surgeon whether or not his blood pressure was acceptable at that time.  [name], M.D.

## 2016-05-09 NOTE — Progress Notes (Signed)
Spoke with the patient and relayed the message, he is going to check his BP this am and notify the Anesthesiologist if he is concerns, appreciated the follow through!!

## 2016-05-22 ENCOUNTER — Ambulatory Visit: Payer: Managed Care, Other (non HMO) | Admitting: Family Medicine

## 2016-06-06 ENCOUNTER — Other Ambulatory Visit: Payer: Self-pay | Admitting: Family Medicine

## 2016-06-06 ENCOUNTER — Encounter: Payer: Self-pay | Admitting: Family Medicine

## 2016-06-06 MED ORDER — LISINOPRIL 20 MG PO TABS: 20.0000 mg | ORAL_TABLET | Freq: Every day | ORAL | 2 refills | Status: DC

## 2016-06-06 NOTE — Telephone Encounter (Signed)
Please send in new dosage

## 2016-06-14 ENCOUNTER — Encounter: Payer: Self-pay | Admitting: Family Medicine

## 2016-06-14 ENCOUNTER — Ambulatory Visit (INDEPENDENT_AMBULATORY_CARE_PROVIDER_SITE_OTHER): Payer: Managed Care, Other (non HMO) | Admitting: Family Medicine

## 2016-06-14 DIAGNOSIS — F411 Generalized anxiety disorder: Secondary | ICD-10-CM | POA: Diagnosis not present

## 2016-06-14 DIAGNOSIS — I10 Essential (primary) hypertension: Secondary | ICD-10-CM

## 2016-06-14 DIAGNOSIS — R634 Abnormal weight loss: Secondary | ICD-10-CM

## 2016-06-14 MED ORDER — ALPRAZOLAM 0.5 MG PO TABS: 0.5000 mg | ORAL_TABLET | Freq: Three times a day (TID) | ORAL | 1 refills | Status: DC | PRN

## 2016-06-14 NOTE — Assessment & Plan Note (Signed)
Patient is down about 9 pounds over the last several months. Hasn't really been trying to lose weight. Does note some nausea after eating at times. Just doesn't feel like eating some of the time. Benign exam today. Symptoms did start some time after his oxycodone dose was increased. Decreased appetite and nausea may be related to that. Discussed monitoring his weight over the next several weeks and rechecking in the office. If continuing to trend down at that time would consider further workup.

## 2016-06-14 NOTE — Assessment & Plan Note (Signed)
Well-controlled at home. Continue current medication. Continue to monitor blood pressure at home.

## 2016-06-14 NOTE — Assessment & Plan Note (Signed)
Well-controlled. Xanax refilled. Continue Lexapro.

## 2016-06-14 NOTE — Progress Notes (Signed)
  Gregory Rumps, MD Phone: [phone]  [name] is a 51 y.o. male who presents today for follow-up.  Hypertension: Typically running 130/80 or less at home. No chest pain, shortness breath, or edema. He is taking lisinopril.  Weight loss: Patient is down about 9 pounds since April. Notes some nausea and decreased appetite. Notes this started after his oxycodone was increased following a surgery on his hand. He notes occasionally feeling nauseous after starting to eat. He notes no abdominal pain, vomiting, or diarrhea. No depression or anxiety. No night sweats. He feels as though the increased dose of oxycodone threw his system off. Currently he is on testosterone shots. In the past did lose weight when he was on those. Has a history of slight WBC elevation though the most recent check was normal.  Anxiety: Notes this is well controlled. Takes Xanax 2-3 times a day. Does not make him drowsy. Is taking Lexapro daily. No depression.  PMH: Former smoker   ROS see history of present illness  Objective  Physical Exam Vitals:   06/14/16 1515  BP: 132/84  Pulse: 94  Temp: 99.1 F (37.3 C)    BP Readings from Last 3 Encounters:  06/14/16 132/84  05/08/16 (!) 148/98  05/03/16 (!) 136/98   Wt Readings from Last 3 Encounters:  06/14/16 175 lb 9.6 oz (79.7 kg)  04/22/16 184 lb 12.8 oz (83.8 kg)  04/15/16 187 lb (84.8 kg)    Physical Exam  Constitutional: No distress.  HENT:  Head: Normocephalic and atraumatic.  Neck: Neck supple.  Cardiovascular: Normal rate, regular rhythm and normal heart sounds.   Pulmonary/Chest: Effort normal and breath sounds normal.  Abdominal: Soft. Bowel sounds are normal. He exhibits no distension. There is no tenderness. There is no rebound and no guarding.  Musculoskeletal: He exhibits no edema.  Lymphadenopathy:    He has no cervical adenopathy.       Right: No supraclavicular adenopathy present.       Left: No supraclavicular adenopathy  present.  Neurological: He is alert. Gait normal.  Skin: Skin is warm and dry. He is not diaphoretic.  Psychiatric: Mood and affect normal.     Assessment/Plan: Please see individual problem list.  Hypertension Well-controlled at home. Continue current medication. Continue to monitor blood pressure at home.  Generalized anxiety disorder Well-controlled. Xanax refilled. Continue Lexapro.  Weight loss Patient is down about 9 pounds over the last several months. Hasn't really been trying to lose weight. Does note some nausea after eating at times. Just doesn't feel like eating some of the time. Benign exam today. Symptoms did start some time after his oxycodone dose was increased. Decreased appetite and nausea may be related to that. Discussed monitoring his weight over the next several weeks and rechecking in the office. If continuing to trend down at that time would consider further workup.   No orders of the defined types were placed in this encounter.   Meds ordered this encounter  Medications  . ALPRAZolam (XANAX) 0.5 MG tablet    Sig: Take 1 tablet (0.5 mg total) by mouth 3 (three) times daily as needed for anxiety.    Dispense:  90 tablet    Refill:  1   Gregory Rumps, MD [place]

## 2016-06-14 NOTE — Patient Instructions (Signed)
Nice to see you. We'll have you return in a few weeks to recheck your weight. Please continue to monitor your blood pressure. If your anxiety worsens please let us know.

## 2016-07-02 ENCOUNTER — Other Ambulatory Visit: Payer: Self-pay | Admitting: Family Medicine

## 2016-07-09 ENCOUNTER — Ambulatory Visit: Payer: Managed Care, Other (non HMO) | Admitting: Family Medicine

## 2016-07-19 ENCOUNTER — Ambulatory Visit: Payer: Managed Care, Other (non HMO) | Admitting: Family Medicine

## 2016-07-23 ENCOUNTER — Other Ambulatory Visit: Payer: Self-pay

## 2016-07-23 MED ORDER — OMEPRAZOLE 40 MG PO CPDR: DELAYED_RELEASE_CAPSULE | ORAL | 3 refills | Status: DC

## 2016-08-19 ENCOUNTER — Other Ambulatory Visit: Payer: Self-pay | Admitting: Family Medicine

## 2016-08-19 NOTE — Telephone Encounter (Signed)
Last OV 06/14/16 last filled 06/14/16 90 1rf

## 2016-08-20 NOTE — Telephone Encounter (Signed)
faxed

## 2016-08-28 LAB — HM HIV SCREENING LAB: HM HIV Screening: NEGATIVE

## 2016-09-30 ENCOUNTER — Telehealth: Payer: Self-pay | Admitting: Family Medicine

## 2016-09-30 NOTE — Telephone Encounter (Signed)
Patients wife notified

## 2016-09-30 NOTE — Telephone Encounter (Signed)
Thanks

## 2016-09-30 NOTE — Telephone Encounter (Signed)
Please advise 

## 2016-09-30 NOTE — Telephone Encounter (Signed)
Patient needs an office visit. Please get him set up for this in the next several weeks. Thanks.

## 2016-09-30 NOTE — Telephone Encounter (Signed)
Patient is scheduled for 10/11/16

## 2016-09-30 NOTE — Telephone Encounter (Signed)
We can obtain lab work at that time.

## 2016-09-30 NOTE — Telephone Encounter (Signed)
Pt would like to get some labs done. Please advise?  Call wife @ [phone]. Thank you!

## 2016-10-11 ENCOUNTER — Ambulatory Visit: Payer: Managed Care, Other (non HMO) | Admitting: Family Medicine

## 2016-10-15 ENCOUNTER — Other Ambulatory Visit: Payer: Self-pay | Admitting: Family Medicine

## 2016-10-17 NOTE — Telephone Encounter (Signed)
Patient notified and states he is tired of coming down here, he was just in here for this and if there is something that Dr.Sonnenberg needs to figure out for him to come down here every three months then he needs to figure it out. Patient states  He should not have to be seen every 3 months for a benzo and DR.Walker never did that. He wants to know what he needs to do so that he does not have to come in here every 3 months.

## 2016-10-17 NOTE — Telephone Encounter (Signed)
Last OV was 6/8, last refill was 8/13, #90 with 1 refill, please advise. thanks

## 2016-10-17 NOTE — Telephone Encounter (Signed)
Patient needs a follow-up scheduled. Refill will be given though he'll need a follow-up on the schedule to receive further refills.

## 2016-10-17 NOTE — Telephone Encounter (Signed)
Thank you for speaking with the patient and clarifying.

## 2016-10-17 NOTE — Telephone Encounter (Signed)
I spoke with Gregory Stokes on the phone.  He was not very happy with the person who he spoke with previously (CMA).  He stated that she needed to stop hanging up on her.  I explained that that behavior is not acceptable, I asked if there was anything I could do to assist him.  He was upset because he recently had to have a re-do hand/arm surgery as the provider made a mistake on the first one and has been having issues for the past 6 months he takes the Xanax for his increased anxiety and he was told today that he needs to come in every 3 months to keep getting that, he was upset because this was never the case when he saw Dr. Gilford Rile.  I explained that there have been new laws put into affect and one was that patients must have follow up care.  He has been overwhelmed with doctor appts and follow ups.  He wanted to know if he took less pills would he be able to stretch the follow up appt to further then 3 months.  I explained that a two month rx was sent today and that he would need to be seen in December to continue he agreed and stated he would call back for hte appt.  He was happy at the end of the call, I think he just needed clarification on the process and how that has changed and how that impacts him.  Thanks

## 2016-11-11 ENCOUNTER — Telehealth: Payer: Self-pay

## 2016-11-11 NOTE — Telephone Encounter (Signed)
noted 

## 2016-11-11 NOTE — Telephone Encounter (Signed)
Copied from [place] [id number]. Topic: General - Other >> Nov 11, 2016  2:26 PM Bea Graff, NT wrote: Reason for CRM: Patient is bringing in a form for Dr. Caryl Bis to sign stating he would not like the new meter from [place] that puts off radiation.

## 2016-11-11 NOTE — Telephone Encounter (Signed)
Pt dropped off form from [place] to be filled out. Placed in Dr. Tharon Aquas colored folder

## 2016-11-11 NOTE — Telephone Encounter (Signed)
In sign folder.

## 2016-11-14 ENCOUNTER — Telehealth: Payer: Self-pay

## 2016-11-14 ENCOUNTER — Encounter: Payer: Self-pay | Admitting: Family Medicine

## 2016-11-14 NOTE — Telephone Encounter (Signed)
It appears we have to have a notary public in our presence when I sign the form. Do we have one in the office? If so we can complete this on Friday. If not we need ot find someone to help with this. Thanks.

## 2016-11-14 NOTE — Telephone Encounter (Signed)
See other phone note

## 2016-11-14 NOTE — Telephone Encounter (Signed)
Patient is calling requesting he please get the paper signed asap so he can get the paper turned back in to [employer].

## 2016-11-14 NOTE — Telephone Encounter (Signed)
Please call patient, see last my chart message.

## 2016-11-14 NOTE — Telephone Encounter (Signed)
Duplicate note

## 2016-11-14 NOTE — Telephone Encounter (Signed)
Spoke with patient he is calling on paperwork for [employer] to check status.  I informed him you were out of office this pm.  He states his on time frame.  Just would like to be called when completed. Thanks

## 2016-11-18 NOTE — Telephone Encounter (Signed)
Form has been signed and patient has picked up form.

## 2016-11-20 ENCOUNTER — Other Ambulatory Visit: Payer: Self-pay | Admitting: Orthopedic Surgery

## 2016-11-20 DIAGNOSIS — M19141 Post-traumatic osteoarthritis, right hand: Secondary | ICD-10-CM

## 2016-11-21 ENCOUNTER — Ambulatory Visit
Admission: RE | Admit: 2016-11-21 | Discharge: 2016-11-21 | Disposition: A | Discharge status: Home / Self Care | Payer: 59 | Source: Ambulatory Visit | Attending: Orthopedic Surgery | Admitting: Orthopedic Surgery

## 2016-11-21 DIAGNOSIS — M19141 Post-traumatic osteoarthritis, right hand: Secondary | ICD-10-CM | POA: Insufficient documentation

## 2016-11-21 DIAGNOSIS — M25431 Effusion, right wrist: Secondary | ICD-10-CM | POA: Insufficient documentation

## 2016-12-20 ENCOUNTER — Other Ambulatory Visit: Payer: Self-pay | Admitting: Family Medicine

## 2016-12-23 ENCOUNTER — Other Ambulatory Visit: Payer: Self-pay | Admitting: Family Medicine

## 2016-12-24 NOTE — Telephone Encounter (Signed)
1 month refill to be faxed.  Patient was previously advised he needed a follow-up to receive further refills.  He needs to schedule a follow-up appointment to receive further refills.  If he does not I will provide with a tapering dose.  Thanks.

## 2016-12-24 NOTE — Telephone Encounter (Signed)
Refilled: 10/17/2016 Last OV: 06/14/2016 Next OV: not scheduled

## 2016-12-25 NOTE — Telephone Encounter (Signed)
Faxed and scheduled

## 2017-01-08 ENCOUNTER — Ambulatory Visit: Payer: 59 | Admitting: Family Medicine

## 2017-01-21 ENCOUNTER — Other Ambulatory Visit: Payer: Self-pay | Admitting: Family Medicine

## 2017-01-22 MED ORDER — ALPRAZOLAM 0.5 MG PO TABS: 0.5000 mg | ORAL_TABLET | Freq: Three times a day (TID) | ORAL | 0 refills | Status: DC | PRN

## 2017-01-22 NOTE — Telephone Encounter (Signed)
Last OV 06/14/16 last filled 12/24/16 90 0rf

## 2017-01-23 ENCOUNTER — Encounter: Payer: Self-pay | Admitting: Family Medicine

## 2017-02-03 ENCOUNTER — Ambulatory Visit (INDEPENDENT_AMBULATORY_CARE_PROVIDER_SITE_OTHER): Payer: 59 | Admitting: Family Medicine

## 2017-02-03 ENCOUNTER — Other Ambulatory Visit: Payer: Self-pay

## 2017-02-03 ENCOUNTER — Encounter: Payer: Self-pay | Admitting: Family Medicine

## 2017-02-03 VITALS — BP 130/80 | HR 67 | Temp 98.9°F | Wt 172.4 lb

## 2017-02-03 DIAGNOSIS — R634 Abnormal weight loss: Secondary | ICD-10-CM | POA: Diagnosis not present

## 2017-02-03 DIAGNOSIS — Z1211 Encounter for screening for malignant neoplasm of colon: Secondary | ICD-10-CM | POA: Diagnosis not present

## 2017-02-03 DIAGNOSIS — M25531 Pain in right wrist: Secondary | ICD-10-CM | POA: Diagnosis not present

## 2017-02-03 DIAGNOSIS — Z125 Encounter for screening for malignant neoplasm of prostate: Secondary | ICD-10-CM | POA: Diagnosis not present

## 2017-02-03 DIAGNOSIS — F411 Generalized anxiety disorder: Secondary | ICD-10-CM

## 2017-02-03 MED ORDER — ALPRAZOLAM 0.5 MG PO TABS: 0.5000 mg | ORAL_TABLET | Freq: Three times a day (TID) | ORAL | 0 refills | Status: DC | PRN

## 2017-02-03 NOTE — Progress Notes (Signed)
[name], MD Phone: [phone]  Gregory Drown Sr. is a 52 y.o. male who presents today for follow-up.  Anxiety: Notes this is fairly well controlled given everything he has had going on.  Takes Xanax about 3 times a day.  Also on Lexapro.  No drowsiness.  No depression.  Patient has had multiple issues with his right wrist.  He had bone removed and subsequently the area became infected.  He had hardware in place that came loose and has caused significant issues.  His hand sweats and is painful intermittently since all this has occurred.  He is supposed to wear a splint.  He notes his hand falls asleep with this.  He sees a new orthopedist tomorrow for reevaluation.  Patient previously did lose a fair amount of weight.  He did not have much of an appetite.  He notes this corresponds with him going back on testosterone supplementations.  He has been on supplements in the past for this and his weight has gone down with them.  Appetite also decreased around the time they increased his oxycodone.  He notes his weight has somewhat stabilized.  He feels good overall.  No sweats or itching.  No lumps or bumps anywhere.  No abdominal pain, diarrhea, blood in his stool, nausea, vomiting, cough, or congestion.    Social History   Tobacco Use  Smoking Status Former Smoker  . Types: Cigarettes  Smokeless Tobacco Never Used     ROS see history of present illness  Objective  Physical Exam Vitals:   02/03/17 1507  BP: 130/80  Pulse: 67  Temp: 98.9 F (37.2 C)  SpO2: 97%    BP Readings from Last 3 Encounters:  02/03/17 130/80  06/14/16 132/84  05/08/16 (!) 148/98   Wt Readings from Last 3 Encounters:  02/03/17 172 lb 6.4 oz (78.2 kg)  06/14/16 175 lb 9.6 oz (79.7 kg)  04/22/16 184 lb 12.8 oz (83.8 kg)    Physical Exam  Constitutional: No distress.  Neck: Neck supple.  Cardiovascular: Normal rate, regular rhythm and normal heart sounds.  Pulmonary/Chest: Effort normal and  breath sounds normal.  Abdominal: Soft. Bowel sounds are normal. He exhibits no distension. There is no tenderness. There is no rebound and no guarding.  Musculoskeletal: He exhibits no edema.  Lymphadenopathy:    He has no cervical adenopathy.    He has no axillary adenopathy.       Right: No supraclavicular adenopathy present.       Left: No supraclavicular adenopathy present.  Neurological: He is alert. Gait normal.  Skin: Skin is warm and dry. He is not diaphoretic.     Assessment/Plan: Please see individual problem list.  Generalized anxiety disorder Relatively stable given all that he is gone through with his wrist.  He will continue his current regimen.  Right wrist pain Chronic issue status post several years.  The wrist does not appear swollen today though does have some slight tenderness.  No overlying erythema.  He will see the orthopedic surgeon tomorrow.  A referral was placed to help with his insurance.  Weight loss Relatively stable since his last office visit.  At that time he was supposed to follow-up 3 weeks later for recheck though did not and has not followed up when advised previously over the last several months.  No significant other symptoms.  Potentially could be related to his testosterone supplementation.  No prior colon cancer screening.  No prior prostate cancer screening.  We  will get those arranged.  Basic lab work as well.  Determine if any further evaluation is needed following lab evaluation and cancer screening.   Orders Placed This Encounter  Procedures  . CBC w/Diff  . Comp Met (CMET)  . TSH  . PSA  . Ambulatory referral to Gastroenterology    Referral Priority:   Routine    Referral Type:   Consultation    Referral Reason:   Specialty Services Required    Number of Visits Requested:   1  . Ambulatory referral to Orthopedic Surgery    Referral Priority:   Routine    Referral Type:   Surgical    Referral Reason:   Specialty Services Required      Requested Specialty:   Orthopedic Surgery    Number of Visits Requested:   1    Meds ordered this encounter  Medications  . ALPRAZolam (XANAX) 0.5 MG tablet    Sig: Take 1 tablet (0.5 mg total) by mouth 3 (three) times daily as needed.    Dispense:  90 tablet    Refill:  0    Not to exceed 4 additional fills before 04/15/2017     [name], MD [place]

## 2017-02-03 NOTE — Assessment & Plan Note (Signed)
Chronic issue status post several years.  The wrist does not appear swollen today though does have some slight tenderness.  No overlying erythema.  He will see the orthopedic surgeon tomorrow.  A referral was placed to help with his insurance.

## 2017-02-03 NOTE — Assessment & Plan Note (Signed)
Relatively stable since his last office visit.  At that time he was supposed to follow-up 3 weeks later for recheck though did not and has not followed up when advised previously over the last several months.  No significant other symptoms.  Potentially could be related to his testosterone supplementation.  No prior colon cancer screening.  No prior prostate cancer screening.  We will get those arranged.  Basic lab work as well.  Determine if any further evaluation is needed following lab evaluation and cancer screening.

## 2017-02-03 NOTE — Assessment & Plan Note (Signed)
Relatively stable given all that he is gone through with his wrist.  He will continue his current regimen.

## 2017-02-03 NOTE — Patient Instructions (Addendum)
Nice to see you. We will get some lab work today and contact you with the results. We will get you set up for colonoscopy. Please see the orthopedic surgeon tomorrow.

## 2017-02-04 DIAGNOSIS — M25531 Pain in right wrist: Secondary | ICD-10-CM | POA: Diagnosis not present

## 2017-02-04 LAB — COMPREHENSIVE METABOLIC PANEL
ALK PHOS: 77 U/L (ref 39–117)
ALT: 16 U/L (ref 0–53)
AST: 20 U/L (ref 0–37)
Albumin: 4.6 g/dL (ref 3.5–5.2)
BILIRUBIN TOTAL: 0.4 mg/dL (ref 0.2–1.2)
BUN: 13 mg/dL (ref 6–23)
CO2: 28 mEq/L (ref 19–32)
CREATININE: 1.29 mg/dL (ref 0.40–1.50)
Calcium: 9.9 mg/dL (ref 8.4–10.5)
Chloride: 101 mEq/L (ref 96–112)
GFR: 62.24 mL/min (ref >60.00)
GLUCOSE: 99 mg/dL (ref 70–99)
Potassium: 4.7 mEq/L (ref 3.5–5.1)
SODIUM: 140 meq/L (ref 135–145)
TOTAL PROTEIN: 7.5 g/dL (ref 6.0–8.3)

## 2017-02-04 LAB — CBC WITH DIFFERENTIAL/PLATELET
BASOS ABS: 0.1 10*3/uL (ref 0.0–0.1)
Basophils Relative: 0.9 % (ref 0.0–3.0)
EOS ABS: 0.4 10*3/uL (ref 0.0–0.7)
Eosinophils Relative: 2.6 % (ref 0.0–5.0)
HCT: 44.5 % (ref 39.0–52.0)
Hemoglobin: 15.3 g/dL (ref 13.0–17.0)
LYMPHS ABS: 2.5 10*3/uL (ref 0.7–4.0)
Lymphocytes Relative: 16.8 % (ref 12.0–46.0)
MCHC: 34.3 g/dL (ref 30.0–36.0)
MCV: 89.6 fl (ref 78.0–100.0)
MONOS PCT: 6.2 % (ref 3.0–12.0)
Monocytes Absolute: 0.9 10*3/uL (ref 0.1–1.0)
NEUTROS PCT: 73.5 % (ref 43.0–77.0)
Neutro Abs: 10.9 10*3/uL — ABNORMAL HIGH (ref 1.4–7.7)
Platelets: 309 10*3/uL (ref 150.0–400.0)
RBC: 4.97 Mil/uL (ref 4.22–5.81)
RDW: 13.7 % (ref 11.5–15.5)
WBC: 14.8 10*3/uL — ABNORMAL HIGH (ref 4.0–10.5)

## 2017-02-04 LAB — TSH: TSH: 1.38 u[IU]/mL (ref 0.35–4.50)

## 2017-02-04 LAB — PSA: PSA: 0.56 ng/mL (ref 0.10–4.00)

## 2017-02-06 DIAGNOSIS — Z79891 Long term (current) use of opiate analgesic: Secondary | ICD-10-CM | POA: Diagnosis not present

## 2017-02-06 DIAGNOSIS — M47896 Other spondylosis, lumbar region: Secondary | ICD-10-CM | POA: Diagnosis not present

## 2017-02-06 DIAGNOSIS — G894 Chronic pain syndrome: Secondary | ICD-10-CM | POA: Diagnosis not present

## 2017-02-06 DIAGNOSIS — M5136 Other intervertebral disc degeneration, lumbar region: Secondary | ICD-10-CM | POA: Diagnosis not present

## 2017-02-07 ENCOUNTER — Other Ambulatory Visit: Payer: Self-pay | Admitting: Family Medicine

## 2017-02-07 ENCOUNTER — Encounter: Payer: Self-pay | Admitting: Family Medicine

## 2017-02-07 DIAGNOSIS — M25539 Pain in unspecified wrist: Secondary | ICD-10-CM

## 2017-02-07 DIAGNOSIS — D72829 Elevated white blood cell count, unspecified: Secondary | ICD-10-CM

## 2017-02-10 ENCOUNTER — Telehealth: Payer: Self-pay

## 2017-02-10 DIAGNOSIS — D72829 Elevated white blood cell count, unspecified: Secondary | ICD-10-CM

## 2017-02-10 DIAGNOSIS — M25531 Pain in right wrist: Secondary | ICD-10-CM

## 2017-02-10 NOTE — Telephone Encounter (Signed)
The patient advised me at his visit that he was seeing orthopedics the day after he saw me.  Can you please find out who he saw as I can find no records of this?  Thanks.

## 2017-02-10 NOTE — Telephone Encounter (Signed)
Patient was scheduled with Ellard Artis PA at [facility], they saw his medical records and recommended [facility] to the patient. He states he does not want to go to [facility]. Patient scheduled an appmt with Dr.Ruch at [facility]

## 2017-02-10 NOTE — Telephone Encounter (Signed)
Copied from [place]. Topic: Referral - Request >> Feb 10, 2017  9:50 AM Bea Graff, NT wrote: Reason for CRM: Pt would like a referral to [facility] or [facility] for a hand specialist.  He states where he was last referred sent him right back to [place] and he does not want to be seen by them. He would like this referral asap. He is in a lot of pain.

## 2017-02-10 NOTE — Telephone Encounter (Signed)
Please advise 

## 2017-02-10 NOTE — Telephone Encounter (Signed)
Noted.  Referral placed.  Please see who he saw last week.  If he is in that much pain he needs to be reevaluated and I would suggest having him evaluated at urgent care or the [facility] urgent care that is open from 1-7.

## 2017-02-11 NOTE — Telephone Encounter (Signed)
Noted.  Please check with the patient to see when he is seeing orthopedics at [facility].  Thanks.

## 2017-02-11 NOTE — Telephone Encounter (Signed)
He was scheduled with Ellard Artis PA at [place], he states they looked at his records the day of his appointment and told him he needed to go back to see [facility]

## 2017-02-12 ENCOUNTER — Other Ambulatory Visit (INDEPENDENT_AMBULATORY_CARE_PROVIDER_SITE_OTHER): Payer: 59

## 2017-02-12 DIAGNOSIS — D72829 Elevated white blood cell count, unspecified: Secondary | ICD-10-CM

## 2017-02-12 DIAGNOSIS — M25539 Pain in unspecified wrist: Secondary | ICD-10-CM

## 2017-02-12 LAB — CBC WITH DIFFERENTIAL/PLATELET
BASOS ABS: 0.1 10*3/uL (ref 0.0–0.1)
Basophils Relative: 0.7 % (ref 0.0–3.0)
Eosinophils Absolute: 0.4 10*3/uL (ref 0.0–0.7)
Eosinophils Relative: 2.9 % (ref 0.0–5.0)
HCT: 48.2 % (ref 39.0–52.0)
Hemoglobin: 16.6 g/dL (ref 13.0–17.0)
LYMPHS ABS: 2.9 10*3/uL (ref 0.7–4.0)
Lymphocytes Relative: 22.3 % (ref 12.0–46.0)
MCHC: 34.4 g/dL (ref 30.0–36.0)
MCV: 88.9 fl (ref 78.0–100.0)
MONOS PCT: 5.6 % (ref 3.0–12.0)
Monocytes Absolute: 0.7 10*3/uL (ref 0.1–1.0)
NEUTROS PCT: 68.5 % (ref 43.0–77.0)
Neutro Abs: 9 10*3/uL — ABNORMAL HIGH (ref 1.4–7.7)
Platelets: 332 10*3/uL (ref 150.0–400.0)
RBC: 5.42 Mil/uL (ref 4.22–5.81)
RDW: 13.5 % (ref 11.5–15.5)
WBC: 13.2 10*3/uL — ABNORMAL HIGH (ref 4.0–10.5)

## 2017-02-12 LAB — C-REACTIVE PROTEIN: CRP: 0.8 mg/dL (ref 0.5–20.0)

## 2017-02-12 LAB — SEDIMENTATION RATE: SED RATE: 17 mm/h (ref 0–20)

## 2017-02-12 NOTE — Telephone Encounter (Signed)
We can send my last note to the patient's surgeon.  Please let the patient know that his white blood cell count is mildly improved.  His inflammatory markers were normal.  We should plan on rechecking this in another 2 weeks to see if it has improved to normal.

## 2017-02-12 NOTE — Telephone Encounter (Signed)
Patient states every time he tells an orthopaedic that it has been operated on already they turn him away. He states that he did not tell [facility] yet that he has had surgery on it. Patient states he is nervous and would like to know if there is something we can do to help? Could we send a note to [place] at [facility]

## 2017-02-12 NOTE — Telephone Encounter (Signed)
Patient states it is February 27th

## 2017-02-13 NOTE — Telephone Encounter (Signed)
Patient notified and scheduled for lab, please place orders  Faxed notes to [place] at [facility]

## 2017-02-13 NOTE — Telephone Encounter (Signed)
Order placed

## 2017-02-13 NOTE — Addendum Note (Signed)
Addended by: Leone Haven on: 02/13/2017 07:27 PM   Modules accepted: Orders

## 2017-02-20 ENCOUNTER — Encounter: Payer: Self-pay | Admitting: Family Medicine

## 2017-02-20 ENCOUNTER — Ambulatory Visit (INDEPENDENT_AMBULATORY_CARE_PROVIDER_SITE_OTHER): Payer: 59

## 2017-02-20 ENCOUNTER — Ambulatory Visit (INDEPENDENT_AMBULATORY_CARE_PROVIDER_SITE_OTHER): Payer: 59 | Admitting: Family Medicine

## 2017-02-20 DIAGNOSIS — R05 Cough: Secondary | ICD-10-CM | POA: Diagnosis not present

## 2017-02-20 DIAGNOSIS — R059 Cough, unspecified: Secondary | ICD-10-CM | POA: Insufficient documentation

## 2017-02-20 MED ORDER — BENZONATATE 100 MG PO CAPS: 100.0000 mg | ORAL_CAPSULE | Freq: Three times a day (TID) | ORAL | 0 refills | Status: DC

## 2017-02-20 MED ORDER — AZITHROMYCIN 250 MG PO TABS: ORAL_TABLET | ORAL | 0 refills | Status: DC

## 2017-02-20 NOTE — Patient Instructions (Addendum)
A chest X-ray has been ordered for you today.  Results will be called to you. Please take medication as directed and follow up with your provider if symptoms do not improve with treatment, worsen, or you develop shortness of breath, fever, or new symptoms.   Cough, Adult A cough helps to clear your throat and lungs. A cough may last only 2-3 weeks (acute), or it may last longer than 8 weeks (chronic). Many different things can cause a cough. A cough may be a sign of an illness or another medical condition. Follow these instructions at home:  Pay attention to any changes in your cough.  Take medicines only as told by your doctor. ? If you were prescribed an antibiotic medicine, take it as told by your doctor. Do not stop taking it even if you start to feel better. ? Talk with your doctor before you try using a cough medicine.  Drink enough fluid to keep your pee (urine) clear or pale yellow.  If the air is dry, use a cold steam vaporizer or humidifier in your home.  Stay away from things that make you cough at work or at home.  If your cough is worse at night, try using extra pillows to raise your head up higher while you sleep.  Do not smoke, and try not to be around smoke. If you need help quitting, ask your doctor.  Do not have caffeine.  Do not drink alcohol.  Rest as needed. Contact a doctor if:  You have new problems (symptoms).  You cough up yellow fluid (pus).  Your cough does not get better after 2-3 weeks, or your cough gets worse.  Medicine does not help your cough and you are not sleeping well.  You have pain that gets worse or pain that is not helped with medicine.  You have a fever.  You are losing weight and you do not know why.  You have night sweats. Get help right away if:  You cough up blood.  You have trouble breathing.  Your heartbeat is very fast. This information is not intended to replace advice given to you by your health care provider.  Make sure you discuss any questions you have with your health care provider. Document Released: 09/06/2010 Document Revised: 06/01/2015 Document Reviewed: 03/02/2014 Elsevier Interactive Patient Education  [employer].

## 2017-02-20 NOTE — Progress Notes (Signed)
Subjective:    Patient ID: Gregory Drown Sr., male    DOB: 1965/01/17, 52 y.o.   MRN: [id number]  HPI  Gregory Stokes is a 52 year old male who presents today with a cough that has been present for 2 months with cough worsening over the past week. He reports that he has not discussed this with PCP as he thought this would improve.  Onset: 2 months ago  Trigger: Cough triggered after feeling symptoms that were "flu like." Other symptoms of myalgias have improved but cough remains  Course: Cough has worsened over the past week   Treatment/efficacy: Mucinex DM and Dayquil have provided moderate benefit.  Associated symptoms: Fever/Chills/Sweats: chills present, denies sweats; reports fever at home Facial Pain: sinus pressure present Frontal Headaches: No Nasal congestion/purulence: Yes Dental pain: No; dental pain present when symptoms started approximately 8 weeks ago; resolved at this time Sore throat: No Ear pain/pressure: No Itchy/watery eyes: No Cough: Yes, that is productive of yellow/brown sputum Pleuritic pain: No Dyspnea: No Wheezing:No  ACE Inhibitor: Yes History of asthma/bronchitis/COPD: No Recent sick contact exposure: No Recent antibiotic use: No GI symptoms of dyspepsia, dysphagia, or reflux: Reflux and takes omeprazole  Former Smoker  History of anxiety: Patient reports feeling anxious today that he has "something wrong with lungs"  Review of Systems  Constitutional: Positive for chills. Negative for fever.  HENT: Positive for congestion, rhinorrhea and sinus pressure. Negative for ear pain, sinus pain, sneezing and sore throat.   Respiratory: Positive for cough. Negative for shortness of breath and wheezing.   Cardiovascular: Negative for chest pain and palpitations.  Gastrointestinal: Negative for abdominal pain, constipation, diarrhea, nausea and vomiting.  Musculoskeletal: Negative for myalgias.  Skin: Negative for rash.  Neurological: Negative for  dizziness, weakness, light-headedness and headaches.  Psychiatric/Behavioral:       Anxious appearing; denies depressed mood   Past Medical History:  Diagnosis Date  . Anxiety   . GERD (gastroesophageal reflux disease)   . Hypertension   . Insomnia   . Low testosterone      Social History   Socioeconomic History  . Marital status: Married    Spouse name: Not on file  . Number of children: Not on file  . Years of education: Not on file  . Highest education level: Not on file  Social Needs  . Financial resource strain: Not on file  . Food insecurity - worry: Not on file  . Food insecurity - inability: Not on file  . Transportation needs - medical: Not on file  . Transportation needs - non-medical: Not on file  Occupational History  . Not on file  Tobacco Use  . Smoking status: Former Smoker    Types: Cigarettes  . Smokeless tobacco: Never Used  Substance and Sexual Activity  . Alcohol use: No    Alcohol/week: 0.0 oz  . Drug use: Not on file  . Sexual activity: Not on file  Other Topics Concern  . Not on file  Social History Narrative   Lives in [place] with wife      Work - [employer]    Past Surgical History:  Procedure Laterality Date  . NASAL SINUS SURGERY     Dr. Carlis Abbott  . Unremarkable      Family History  Problem Relation Age of Onset  . Lung cancer Father        deceased    No Known Allergies  Current Outpatient Medications on File Prior to Visit  Medication Sig Dispense Refill  . ALPRAZolam (XANAX) 0.5 MG tablet Take 1 tablet (0.5 mg total) by mouth 3 (three) times daily as needed. 90 tablet 0  . aspirin 81 MG tablet Take 81 mg by mouth daily.    Marland Kitchen escitalopram (LEXAPRO) 10 MG tablet TAKE 1 TABLET BY MOUTH EVERY DAY 90 tablet 1  . gabapentin (NEURONTIN) 300 MG capsule Take 300 mg by mouth 3 (three) times daily.    Marland Kitchen ketorolac (TORADOL) 10 MG tablet Take 1 tablet (10 mg total) by mouth every 8 (eight) hours. 15 tablet 0  . lisinopril  (PRINIVIL,ZESTRIL) 20 MG tablet Take 1 tablet (20 mg total) by mouth daily. 90 tablet 2  . magnesium oxide (MAG-OX) 400 MG tablet Take 400 mg by mouth daily.    . meloxicam (MOBIC) 15 MG tablet TAKE 1 TABLET BY MOUTH EVERY DAY 30 tablet 6  . Multiple Vitamin (MULTIVITAMIN) tablet Take 1 tablet by mouth daily.    Marland Kitchen omeprazole (PRILOSEC) 40 MG capsule TAKE 1 CAPSULE (40 MG TOTAL) BY MOUTH DAILY. 90 capsule 3  . orphenadrine (NORFLEX) 100 MG tablet Take 1 tablet (100 mg total) by mouth 2 (two) times daily as needed for muscle spasms. 20 tablet 0  . Oxycodone HCl 10 MG TABS Take by mouth. Take one 3 to 5 times a day    . sildenafil (VIAGRA) 100 MG tablet Take 50 mg by mouth daily as needed.    . testosterone cypionate (DEPOTESTOTERONE CYPIONATE) 100 MG/ML injection Inject 100 mg into the muscle every 14 (fourteen) days. For IM use only    . tiZANidine (ZANAFLEX) 4 MG capsule Take 4 mg by mouth 3 (three) times daily.    Marland Kitchen triamcinolone cream (KENALOG) 0.1 % Apply 1 application topically 2 (two) times daily. 30 g 0   No current facility-administered medications on file prior to visit.     BP 138/82 (BP Location: Left Arm, Patient Position: Sitting)   Pulse 77   Temp 98.7 F (37.1 C) (Oral)   Resp 16   Wt 174 lb 4 oz (79 kg)   SpO2 97%   BMI 24.30 kg/m       Objective:   Physical Exam  Constitutional: He is oriented to person, place, and time. He appears well-developed and well-nourished.  HENT:  Right Ear: Tympanic membrane normal.  Left Ear: Tympanic membrane normal.  Nose: Rhinorrhea present. Right sinus exhibits no maxillary sinus tenderness and no frontal sinus tenderness. Left sinus exhibits no maxillary sinus tenderness and no frontal sinus tenderness.  Mouth/Throat: Mucous membranes are normal. No oropharyngeal exudate or posterior oropharyngeal erythema.  Eyes: Pupils are equal, round, and reactive to light. No scleral icterus.  Neck: Neck supple.  Cardiovascular: Normal rate  and regular rhythm.  Pulmonary/Chest: Effort normal. He has rales.  Abdominal: Soft. Bowel sounds are normal. There is no tenderness. There is no rebound.  Musculoskeletal: He exhibits no edema.  Lymphadenopathy:    He has no cervical adenopathy.  Neurological: He is alert and oriented to person, place, and time.  Skin: Skin is warm and dry. No rash noted.  Psychiatric:  Anxious appearing        Assessment & Plan:  1. Cough Cough that has been present for 2 months that is worsening over the past week;  Rales noted on exam with worsening cough present; patient is clearly anxious about symptom; will obtain chest X-ray today and provide him with azithromycin and benzonatate for cough. He is being followed  by PCP for elevated WBC that has mildly improved with latest lab work. His PCP advised follow up lab work to evaluate if WBC is improving. Patient voiced understanding and agreed with plan. We further discussed reasons for cough including acid reflux, ACE inhibitors, and asthma. With current presentation and trigger of upper respiratory symptoms prior to cough worsening, will empirically treat as above and obtain chest X-ray for concern for CAP.  Advised that if his symptoms were to worsen with fever or SOB, he should follow up for further evaluation. He was given return precautions. - DG Chest 2 View; Future  Delano Metz, FNP-C

## 2017-02-25 ENCOUNTER — Telehealth: Payer: Self-pay | Admitting: Family Medicine

## 2017-02-25 NOTE — Telephone Encounter (Signed)
Patient saw Marcelline Mates on 02/20/17

## 2017-02-25 NOTE — Telephone Encounter (Signed)
Patient notified and scheduled 

## 2017-02-25 NOTE — Telephone Encounter (Signed)
Copied from [place] [id number]. Topic: Inquiry >> Feb 25, 2017  1:11 PM Pricilla Handler wrote: Reason for CRM: Patient wants to know if he can have a refill of the Z-Pack. Patient states that he has ran out, but is still sick. Some one from the office please contact patient at [phone]. Marland Kitchen

## 2017-02-25 NOTE — Telephone Encounter (Signed)
He will need to be reevaluated  

## 2017-02-27 ENCOUNTER — Telehealth: Payer: Self-pay | Admitting: *Deleted

## 2017-02-27 ENCOUNTER — Ambulatory Visit (INDEPENDENT_AMBULATORY_CARE_PROVIDER_SITE_OTHER): Payer: 59 | Admitting: Internal Medicine

## 2017-02-27 ENCOUNTER — Encounter: Payer: Self-pay | Admitting: Internal Medicine

## 2017-02-27 ENCOUNTER — Other Ambulatory Visit: Payer: 59

## 2017-02-27 VITALS — BP 132/68 | HR 74 | Temp 98.4°F | Ht 71.0 in | Wt 173.2 lb

## 2017-02-27 DIAGNOSIS — J40 Bronchitis, not specified as acute or chronic: Secondary | ICD-10-CM | POA: Diagnosis not present

## 2017-02-27 DIAGNOSIS — D72829 Elevated white blood cell count, unspecified: Secondary | ICD-10-CM

## 2017-02-27 MED ORDER — DOXYCYCLINE HYCLATE 100 MG PO TABS: 100.0000 mg | ORAL_TABLET | Freq: Two times a day (BID) | ORAL | 0 refills | Status: DC

## 2017-02-27 MED ORDER — ALBUTEROL SULFATE HFA 108 (90 BASE) MCG/ACT IN AERS: 1.0000 | INHALATION_SPRAY | Freq: Four times a day (QID) | RESPIRATORY_TRACT | 1 refills | Status: AC | PRN

## 2017-02-27 MED ORDER — METHYLPREDNISOLONE ACETATE 40 MG/ML IJ SUSP
40.0000 mg | Freq: Once | INTRAMUSCULAR | Status: AC
  Administered 2017-02-27: 40 mg via INTRAMUSCULAR

## 2017-02-27 NOTE — Telephone Encounter (Signed)
Lab orders placed.  

## 2017-02-27 NOTE — Patient Instructions (Addendum)
Try Zyrtec or Claritin at night as needed for ear symptoms, sneezing  Try Nasal saline 2 sprays 1x per day  Try Flonase 2 sprays 1x per day  F/u in 2-3 weeks with PCP Try Mucinex DM green label for cough  Try Albuterol inhaler 1-2 puffs every 6 hours as needed  Try Doxycycline 2x per day with food and water x 1 week  If your cough does not get better consider Chest CT  Hold on rechecking blood counts until your next visit Feel Better  Acute Bronchitis, Adult Acute bronchitis is sudden (acute) swelling of the air tubes (bronchi) in the lungs. Acute bronchitis causes these tubes to fill with mucus, which can make it hard to breathe. It can also cause coughing or wheezing. In adults, acute bronchitis usually goes away within 2 weeks. A cough caused by bronchitis may last up to 3 weeks. Smoking, allergies, and asthma can make the condition worse. Repeated episodes of bronchitis may cause further lung problems, such as chronic obstructive pulmonary disease (COPD). What are the causes? This condition can be caused by germs and by substances that irritate the lungs, including:  Cold and flu viruses. This condition is most often caused by the same virus that causes a cold.  Bacteria.  Exposure to tobacco smoke, dust, fumes, and air pollution.  What increases the risk? This condition is more likely to develop in people who:  Have close contact with someone with acute bronchitis.  Are exposed to lung irritants, such as tobacco smoke, dust, fumes, and vapors.  Have a weak immune system.  Have a respiratory condition such as asthma.  What are the signs or symptoms? Symptoms of this condition include:  A cough.  Coughing up clear, yellow, or green mucus.  Wheezing.  Chest congestion.  Shortness of breath.  A fever.  Body aches.  Chills.  A sore throat.  How is this diagnosed? This condition is usually diagnosed with a physical exam. During the exam, your health care  provider may order tests, such as chest X-rays, to rule out other conditions. He or she may also:  Test a sample of your mucus for bacterial infection.  Check the level of oxygen in your blood. This is done to check for pneumonia.  Do a chest X-ray or lung function testing to rule out pneumonia and other conditions.  Perform blood tests.  Your health care provider will also ask about your symptoms and medical history. How is this treated? Most cases of acute bronchitis clear up over time without treatment. Your health care provider may recommend:  Drinking more fluids. Drinking more makes your mucus thinner, which may make it easier to breathe.  Taking a medicine for a fever or cough.  Taking an antibiotic medicine.  Using an inhaler to help improve shortness of breath and to control a cough.  Using a cool mist vaporizer or humidifier to make it easier to breathe.  Follow these instructions at home: Medicines  Take over-the-counter and prescription medicines only as told by your health care provider.  If you were prescribed an antibiotic, take it as told by your health care provider. Do not stop taking the antibiotic even if you start to feel better. General instructions  Get plenty of rest.  Drink enough fluids to keep your urine clear or pale yellow.  Avoid smoking and secondhand smoke. Exposure to cigarette smoke or irritating chemicals will make bronchitis worse. If you smoke and you need help quitting, ask your health  care provider. Quitting smoking will help your lungs heal faster.  Use an inhaler, cool mist vaporizer, or humidifier as told by your health care provider.  Keep all follow-up visits as told by your health care provider. This is important. How is this prevented? To lower your risk of getting this condition again:  Wash your hands often with soap and water. If soap and water are not available, use hand sanitizer.  Avoid contact with people who have cold  symptoms.  Try not to touch your hands to your mouth, nose, or eyes.  Make sure to get the flu shot every year.  Contact a health care provider if:  Your symptoms do not improve in 2 weeks of treatment. Get help right away if:  You cough up blood.  You have chest pain.  You have severe shortness of breath.  You become dehydrated.  You faint or keep feeling like you are going to faint.  You keep vomiting.  You have a severe headache.  Your fever or chills gets worse. This information is not intended to replace advice given to you by your health care provider. Make sure you discuss any questions you have with your health care provider. Document Released: 02/01/2004 Document Revised: 07/19/2015 Document Reviewed: 06/14/2015 Elsevier Interactive Patient Education  [employer].

## 2017-02-27 NOTE — Progress Notes (Signed)
Pre visit review using our clinic review tool, if applicable. No additional management support is needed unless otherwise documented below in the visit note. 

## 2017-03-02 ENCOUNTER — Encounter: Payer: Self-pay | Admitting: Internal Medicine

## 2017-03-02 DIAGNOSIS — J40 Bronchitis, not specified as acute or chronic: Secondary | ICD-10-CM | POA: Insufficient documentation

## 2017-03-02 NOTE — Progress Notes (Signed)
Chief Complaint  Patient presents with  . Follow-up  . Cough   F/u  1. C/o cough and wheezing and congestion tx'ed with Zpack 02/20/17 and did not get better. He has had productive cough, sneezing, nasal drainage from nose, "wafering sound in ears", wheezing. He reports normally Zpack works but he is not better. Denies h/o allergies, asthma, former smoker but not currently  He also was given Ladona Ridgel but caused him to be drowsy CXR 02/20/17 with mild bronchitis changes and peribronchial thickening.   2. Elevated WBC since 2011 needs repeat CBC at f/u and if continues to be elevated referral to H/o   Review of Systems  Constitutional: Negative for fever.  HENT: Positive for congestion.        +nose runny, +sneezing +abnormal sensation in ears   Respiratory: Positive for cough and wheezing.   Cardiovascular: Negative for chest pain.  Endo/Heme/Allergies:       +elevated wbc    Past Medical History:  Diagnosis Date  . Anxiety   . Bronchitis   . GERD (gastroesophageal reflux disease)   . Hypertension   . Insomnia   . Low testosterone    Past Surgical History:  Procedure Laterality Date  . NASAL SINUS SURGERY     Dr. Carlis Abbott  . Unremarkable     Family History  Problem Relation Age of Onset  . Lung cancer Father        deceased   Social History   Socioeconomic History  . Marital status: Married    Spouse name: Not on file  . Number of children: Not on file  . Years of education: Not on file  . Highest education level: Not on file  Social Needs  . Financial resource strain: Not on file  . Food insecurity - worry: Not on file  . Food insecurity - inability: Not on file  . Transportation needs - medical: Not on file  . Transportation needs - non-medical: Not on file  Occupational History  . Not on file  Tobacco Use  . Smoking status: Former Smoker    Types: Cigarettes  . Smokeless tobacco: Never Used  Substance and Sexual Activity  . Alcohol use: No   Alcohol/week: 0.0 oz  . Drug use: Not on file  . Sexual activity: Not on file  Other Topics Concern  . Not on file  Social History Narrative   Lives in [place] with wife      Work - [employer]   Current Meds  Medication Sig  . ALPRAZolam (XANAX) 0.5 MG tablet Take 1 tablet (0.5 mg total) by mouth 3 (three) times daily as needed.  Marland Kitchen aspirin 81 MG tablet Take 81 mg by mouth daily.  Marland Kitchen escitalopram (LEXAPRO) 10 MG tablet TAKE 1 TABLET BY MOUTH EVERY DAY  . gabapentin (NEURONTIN) 300 MG capsule Take 300 mg by mouth 3 (three) times daily.  Marland Kitchen ketorolac (TORADOL) 10 MG tablet Take 1 tablet (10 mg total) by mouth every 8 (eight) hours.  Marland Kitchen lisinopril (PRINIVIL,ZESTRIL) 20 MG tablet Take 1 tablet (20 mg total) by mouth daily.  . magnesium oxide (MAG-OX) 400 MG tablet Take 400 mg by mouth daily.  . meloxicam (MOBIC) 15 MG tablet TAKE 1 TABLET BY MOUTH EVERY DAY  . Multiple Vitamin (MULTIVITAMIN) tablet Take 1 tablet by mouth daily.  Marland Kitchen omeprazole (PRILOSEC) 40 MG capsule TAKE 1 CAPSULE (40 MG TOTAL) BY MOUTH DAILY.  . orphenadrine (NORFLEX) 100 MG tablet Take 1 tablet (100 mg total)  by mouth 2 (two) times daily as needed for muscle spasms.  . Oxycodone HCl 10 MG TABS Take by mouth. Take one 3 to 5 times a day  . sildenafil (VIAGRA) 100 MG tablet Take 50 mg by mouth daily as needed.  . testosterone cypionate (DEPOTESTOTERONE CYPIONATE) 100 MG/ML injection Inject 100 mg into the muscle every 14 (fourteen) days. For IM use only  . tiZANidine (ZANAFLEX) 4 MG capsule Take 4 mg by mouth 3 (three) times daily.  Marland Kitchen triamcinolone cream (KENALOG) 0.1 % Apply 1 application topically 2 (two) times daily.   No Known Allergies Recent Results (from the past 2160 hour(s))  CBC w/Diff     Status: Abnormal   Collection Time: 02/03/17  3:46 PM  Result Value Ref Range   WBC 14.8 (H) 4.0 - 10.5 K/uL   RBC 4.97 4.22 - 5.81 Mil/uL   Hemoglobin 15.3 13.0 - 17.0 g/dL   HCT 44.5 39.0 - 52.0 %   MCV 89.6 78.0 -  100.0 fl   MCHC 34.3 30.0 - 36.0 g/dL   RDW 13.7 11.5 - 15.5 %   Platelets 309.0 150.0 - 400.0 K/uL   Neutrophils Relative % 73.5 43.0 - 77.0 %   Lymphocytes Relative 16.8 12.0 - 46.0 %   Monocytes Relative 6.2 3.0 - 12.0 %   Eosinophils Relative 2.6 0.0 - 5.0 %   Basophils Relative 0.9 0.0 - 3.0 %   Neutro Abs 10.9 (H) 1.4 - 7.7 K/uL   Lymphs Abs 2.5 0.7 - 4.0 K/uL   Monocytes Absolute 0.9 0.1 - 1.0 K/uL   Eosinophils Absolute 0.4 0.0 - 0.7 K/uL   Basophils Absolute 0.1 0.0 - 0.1 K/uL  Comp Met (CMET)     Status: None   Collection Time: 02/03/17  3:46 PM  Result Value Ref Range   Sodium 140 135 - 145 mEq/L   Potassium 4.7 3.5 - 5.1 mEq/L   Chloride 101 96 - 112 mEq/L   CO2 28 19 - 32 mEq/L   Glucose, Bld 99 70 - 99 mg/dL   BUN 13 6 - 23 mg/dL   Creatinine, Ser 1.29 0.40 - 1.50 mg/dL   Total Bilirubin 0.4 0.2 - 1.2 mg/dL   Alkaline Phosphatase 77 39 - 117 U/L   AST 20 0 - 37 U/L   ALT 16 0 - 53 U/L   Total Protein 7.5 6.0 - 8.3 g/dL   Albumin 4.6 3.5 - 5.2 g/dL   Calcium 9.9 8.4 - 10.5 mg/dL   GFR 62.24 >60.00 mL/min  TSH     Status: None   Collection Time: 02/03/17  3:46 PM  Result Value Ref Range   TSH 1.38 0.35 - 4.50 uIU/mL  PSA     Status: None   Collection Time: 02/03/17  3:46 PM  Result Value Ref Range   PSA 0.56 0.10 - 4.00 ng/mL    Comment: Test performed using Access Hybritech PSA Assay, a parmagnetic partical, chemiluminecent immunoassay.  CBC with Differential/Platelet     Status: Abnormal   Collection Time: 02/12/17 11:13 AM  Result Value Ref Range   WBC 13.2 (H) 4.0 - 10.5 K/uL   RBC 5.42 4.22 - 5.81 Mil/uL   Hemoglobin 16.6 13.0 - 17.0 g/dL   HCT 48.2 39.0 - 52.0 %   MCV 88.9 78.0 - 100.0 fl   MCHC 34.4 30.0 - 36.0 g/dL   RDW 13.5 11.5 - 15.5 %   Platelets 332.0 150.0 - 400.0 K/uL   Neutrophils  Relative % 68.5 43.0 - 77.0 %   Lymphocytes Relative 22.3 12.0 - 46.0 %   Monocytes Relative 5.6 3.0 - 12.0 %   Eosinophils Relative 2.9 0.0 - 5.0 %    Basophils Relative 0.7 0.0 - 3.0 %   Neutro Abs 9.0 (H) 1.4 - 7.7 K/uL   Lymphs Abs 2.9 0.7 - 4.0 K/uL   Monocytes Absolute 0.7 0.1 - 1.0 K/uL   Eosinophils Absolute 0.4 0.0 - 0.7 K/uL   Basophils Absolute 0.1 0.0 - 0.1 K/uL  C-reactive protein     Status: None   Collection Time: 02/12/17 11:13 AM  Result Value Ref Range   CRP 0.8 0.5 - 20.0 mg/dL  Sedimentation rate     Status: None   Collection Time: 02/12/17 11:13 AM  Result Value Ref Range   Sed Rate 17 0 - 20 mm/hr   Objective  Body mass index is 24.16 kg/m. Wt Readings from Last 3 Encounters:  02/27/17 173 lb 3.2 oz (78.6 kg)  02/20/17 174 lb 4 oz (79 kg)  02/03/17 172 lb 6.4 oz (78.2 kg)   Temp Readings from Last 3 Encounters:  02/27/17 98.4 F (36.9 C) (Oral)  02/20/17 98.7 F (37.1 C) (Oral)  02/03/17 98.9 F (37.2 C) (Oral)   BP Readings from Last 3 Encounters:  02/27/17 132/68  02/20/17 138/82  02/03/17 130/80   Pulse Readings from Last 3 Encounters:  02/27/17 74  02/20/17 77  02/03/17 67   O2 sat room air 97%  Physical Exam  Constitutional: He is oriented to person, place, and time and well-developed, well-nourished, and in no distress. Vital signs are normal.  HENT:  Head: Normocephalic and atraumatic.  Mouth/Throat: Oropharynx is clear and moist and mucous membranes are normal.  Eyes: Conjunctivae are normal. Pupils are equal, round, and reactive to light.  Cardiovascular: Normal rate, regular rhythm and normal heart sounds.  Pulmonary/Chest: He has wheezes.  Wheezing and course breath sounds on exam  Neurological: He is alert and oriented to person, place, and time. Gait abnormal.  Skin: Skin is warm, dry and intact.  Psychiatric: Mood, memory, affect and judgment normal.  Nursing note and vitals reviewed.   Assessment   1. Bronchitis noted CXR 02/20/17 with course breath sounds and wheezing today  2. Leukocytosis since 2011  3. HM Plan  1.  Given depo medrol 40 mg x 1 today pt did not want  to try oral steroids  Rx Doxycycline bid x 1 week  Mucinex DM Trial of OTC antihistamine, NS, Flonase  Prn Albuterol inhaler  If not better rec do CT chest he is former smoker and cough has been present sicne 12/2016   2. Repeat CBC at f/u held for today  Persistent leukocytosis since 2011 consider referral to H/o further w/u in future  3.  Assess flu shot at f/u  Tdap had 09/23/09  Consider shingrix in future   PSA 02/03/17 neg  Colonoscopy needs to disc with PCP in future  Former smoker  Consider repeat lipid not had since 01/30/15 and UA in future for health maintenance (HM)   Provider: Dr. Olivia Mackie McLean-Scocuzza-Internal Medicine

## 2017-03-04 ENCOUNTER — Other Ambulatory Visit: Payer: Self-pay | Admitting: Family Medicine

## 2017-03-05 DIAGNOSIS — M25531 Pain in right wrist: Secondary | ICD-10-CM | POA: Diagnosis not present

## 2017-03-13 ENCOUNTER — Telehealth: Payer: Self-pay | Admitting: Gastroenterology

## 2017-03-13 NOTE — Telephone Encounter (Signed)
[facility] doctor's office was calling about this patients procedure. I told her you usually call twice and then send a letter.

## 2017-03-13 NOTE — Telephone Encounter (Signed)
Contacted office to let them know that on 02/13/17 patients wife stated that they will contact office when they are ready to schedule.

## 2017-03-14 ENCOUNTER — Encounter: Payer: Self-pay | Admitting: Internal Medicine

## 2017-03-14 ENCOUNTER — Ambulatory Visit (INDEPENDENT_AMBULATORY_CARE_PROVIDER_SITE_OTHER): Payer: 59 | Admitting: Internal Medicine

## 2017-03-14 VITALS — BP 122/76 | HR 76 | Temp 98.1°F | Ht 71.0 in | Wt 176.8 lb

## 2017-03-14 DIAGNOSIS — D72829 Elevated white blood cell count, unspecified: Secondary | ICD-10-CM

## 2017-03-14 DIAGNOSIS — Z1389 Encounter for screening for other disorder: Secondary | ICD-10-CM

## 2017-03-14 DIAGNOSIS — Z1159 Encounter for screening for other viral diseases: Secondary | ICD-10-CM

## 2017-03-14 DIAGNOSIS — J40 Bronchitis, not specified as acute or chronic: Secondary | ICD-10-CM

## 2017-03-14 DIAGNOSIS — Z13818 Encounter for screening for other digestive system disorders: Secondary | ICD-10-CM | POA: Diagnosis not present

## 2017-03-14 DIAGNOSIS — Z1283 Encounter for screening for malignant neoplasm of skin: Secondary | ICD-10-CM | POA: Diagnosis not present

## 2017-03-14 DIAGNOSIS — L989 Disorder of the skin and subcutaneous tissue, unspecified: Secondary | ICD-10-CM

## 2017-03-14 DIAGNOSIS — E785 Hyperlipidemia, unspecified: Secondary | ICD-10-CM

## 2017-03-14 LAB — CBC WITH DIFFERENTIAL/PLATELET
BASOS ABS: 0.1 10*3/uL (ref 0.0–0.1)
Basophils Relative: 0.7 % (ref 0.0–3.0)
EOS ABS: 0.5 10*3/uL (ref 0.0–0.7)
Eosinophils Relative: 4.2 % (ref 0.0–5.0)
HCT: 45.9 % (ref 39.0–52.0)
HEMOGLOBIN: 15.8 g/dL (ref 13.0–17.0)
LYMPHS ABS: 2.6 10*3/uL (ref 0.7–4.0)
Lymphocytes Relative: 24 % (ref 12.0–46.0)
MCHC: 34.4 g/dL (ref 30.0–36.0)
MCV: 88.9 fl (ref 78.0–100.0)
MONO ABS: 0.6 10*3/uL (ref 0.1–1.0)
Monocytes Relative: 5.1 % (ref 3.0–12.0)
NEUTROS PCT: 66 % (ref 43.0–77.0)
Neutro Abs: 7.2 10*3/uL (ref 1.4–7.7)
Platelets: 301 10*3/uL (ref 150.0–400.0)
RBC: 5.16 Mil/uL (ref 4.22–5.81)
RDW: 13.3 % (ref 11.5–15.5)
WBC: 10.9 10*3/uL — AB (ref 4.0–10.5)

## 2017-03-14 LAB — LIPID PANEL
CHOLESTEROL: 183 mg/dL (ref 0–200)
HDL: 41.6 mg/dL (ref >39.00)
LDL CALC: 116 mg/dL — AB (ref 0–99)
NonHDL: 141.6
TRIGLYCERIDES: 128 mg/dL (ref 0.0–149.0)
Total CHOL/HDL Ratio: 4
VLDL: 25.6 mg/dL (ref 0.0–40.0)

## 2017-03-14 NOTE — Patient Instructions (Addendum)
I referred you to Dr. Kellie Moor dermatology for skin check and left thigh lesion  F/u with PCP Dr. Chauncey Cruel as scheduled   Hepatitis B Vaccine, Recombinant injection What is this medicine? HEPATITIS B VACCINE (hep uh TAHY tis B VAK seen) is a vaccine. It is used to prevent an infection with the hepatitis B virus. This medicine may be used for other purposes; ask your health care provider or pharmacist if you have questions. COMMON BRAND NAME(S): Engerix-B, Recombivax HB What should I tell my health care provider before I take this medicine? They need to know if you have any of these conditions: -fever, infection -heart disease -hepatitis B infection -immune system problems -kidney disease -an unusual or allergic reaction to vaccines, yeast, other medicines, foods, dyes, or preservatives -pregnant or trying to get pregnant -breast-feeding How should I use this medicine? This vaccine is for injection into a muscle. It is given by a health care professional. A copy of Vaccine Information Statements will be given before each vaccination. Read this sheet carefully each time. The sheet may change frequently. Talk to your pediatrician regarding the use of this medicine in children. While this drug may be prescribed for children as young as newborn for selected conditions, precautions do apply. Overdosage: If you think you have taken too much of this medicine contact a poison control center or emergency room at once. NOTE: This medicine is only for you. Do not share this medicine with others. What if I miss a dose? It is important not to miss your dose. Call your doctor or health care professional if you are unable to keep an appointment. What may interact with this medicine? -medicines that suppress your immune function like adalimumab, anakinra, infliximab -medicines to treat cancer -steroid medicines like prednisone or cortisone This list may not describe all possible interactions. Give your health  care provider a list of all the medicines, herbs, non-prescription drugs, or dietary supplements you use. Also tell them if you smoke, drink alcohol, or use illegal drugs. Some items may interact with your medicine. What should I watch for while using this medicine? See your health care provider for all shots of this vaccine as directed. You must have 3 shots of this vaccine for protection from hepatitis B infection. Tell your doctor right away if you have any serious or unusual side effects after getting this vaccine. What side effects may I notice from receiving this medicine? Side effects that you should report to your doctor or health care professional as soon as possible: -allergic reactions like skin rash, itching or hives, swelling of the face, lips, or tongue -breathing problems -confused, irritated -fast, irregular heartbeat -flu-like syndrome -numb, tingling pain -seizures -unusually weak or tired Side effects that usually do not require medical attention (report to your doctor or health care professional if they continue or are bothersome): -diarrhea -fever -headache -loss of appetite -muscle pain -nausea -pain, redness, swelling, or irritation at site where injected -tiredness This list may not describe all possible side effects. Call your doctor for medical advice about side effects. You may report side effects to FDA at 1-800-FDA-1088. Where should I keep my medicine? This drug is given in a hospital or clinic and will not be stored at home. NOTE: This sheet is a summary. It may not cover all possible information. If you have questions about this medicine, talk to your doctor, pharmacist, or health care provider.  2018 Elsevier/Gold Standard (2013-04-26 13:26:01)

## 2017-03-14 NOTE — Addendum Note (Signed)
Addended by: Leeanne Rio on: 03/14/2017 11:38 AM   Modules accepted: Orders

## 2017-03-14 NOTE — Progress Notes (Signed)
Chief Complaint  Patient presents with  . Follow-up   F/u  1. Bronchitis doing better albuterol inhaler helped. He is former smoker reviewed hx today updated in social hx  2. C/o left thigh lesion x a while maybe his whole life but painful to touch and increasing in size. He wants lesion checked out    Review of Systems  Constitutional: Negative for weight loss.  HENT: Negative for hearing loss.   Eyes: Negative for blurred vision.  Respiratory: Negative for cough and shortness of breath.   Cardiovascular: Negative for chest pain.  Skin:       +left thigh lesion    Past Medical History:  Diagnosis Date  . Anxiety   . Bronchitis   . GERD (gastroesophageal reflux disease)   . Hypertension   . Insomnia   . Low testosterone    Past Surgical History:  Procedure Laterality Date  . NASAL SINUS SURGERY     Dr. Carlis Abbott  . Unremarkable     Family History  Problem Relation Age of Onset  . Lung cancer Father        deceased   Social History   Socioeconomic History  . Marital status: Married    Spouse name: Not on file  . Number of children: Not on file  . Years of education: Not on file  . Highest education level: Not on file  Social Needs  . Financial resource strain: Not on file  . Food insecurity - worry: Not on file  . Food insecurity - inability: Not on file  . Transportation needs - medical: Not on file  . Transportation needs - non-medical: Not on file  Occupational History  . Not on file  Tobacco Use  . Smoking status: Former Smoker    Types: Cigarettes  . Smokeless tobacco: Never Used  . Tobacco comment: smoked age [age] stopped then age 2 x 1 years max 1/2 ppd   Substance and Sexual Activity  . Alcohol use: No    Alcohol/week: 0.0 oz  . Drug use: No  . Sexual activity: Yes  Other Topics Concern  . Not on file  Social History Narrative   Lives in [place] with wife      Work - [employer]   Current Meds  Medication Sig  . albuterol (PROVENTIL  HFA;VENTOLIN HFA) 108 (90 Base) MCG/ACT inhaler Inhale 1-2 puffs into the lungs every 6 (six) hours as needed for wheezing or shortness of breath.  . ALPRAZolam (XANAX) 0.5 MG tablet Take 1 tablet (0.5 mg total) by mouth 3 (three) times daily as needed.  Marland Kitchen amitriptyline (ELAVIL) 50 MG tablet TK 1 T PO QD  . aspirin 81 MG tablet Take 81 mg by mouth daily.  Marland Kitchen doxycycline (VIBRA-TABS) 100 MG tablet Take 1 tablet (100 mg total) by mouth 2 (two) times daily.  Marland Kitchen escitalopram (LEXAPRO) 10 MG tablet TAKE 1 TABLET BY MOUTH EVERY DAY  . gabapentin (NEURONTIN) 300 MG capsule Take 300 mg by mouth 3 (three) times daily.  Marland Kitchen ketorolac (TORADOL) 10 MG tablet Take 1 tablet (10 mg total) by mouth every 8 (eight) hours.  Marland Kitchen lisinopril (PRINIVIL,ZESTRIL) 20 MG tablet TAKE 1 TABLET BY MOUTH EVERY DAY  . magnesium oxide (MAG-OX) 400 MG tablet Take 400 mg by mouth daily.  . meloxicam (MOBIC) 15 MG tablet TAKE 1 TABLET BY MOUTH EVERY DAY  . Multiple Vitamin (MULTIVITAMIN) tablet Take 1 tablet by mouth daily.  Marland Kitchen omeprazole (PRILOSEC) 40 MG capsule TAKE 1  CAPSULE (40 MG TOTAL) BY MOUTH DAILY.  . orphenadrine (NORFLEX) 100 MG tablet Take 1 tablet (100 mg total) by mouth 2 (two) times daily as needed for muscle spasms.  . Oxycodone HCl 10 MG TABS Take by mouth. Take one 3 to 5 times a day  . sildenafil (VIAGRA) 100 MG tablet Take 50 mg by mouth daily as needed.  . testosterone cypionate (DEPOTESTOTERONE CYPIONATE) 100 MG/ML injection Inject 100 mg into the muscle every 14 (fourteen) days. For IM use only  . tiZANidine (ZANAFLEX) 4 MG capsule Take 4 mg by mouth 3 (three) times daily.  Marland Kitchen triamcinolone cream (KENALOG) 0.1 % Apply 1 application topically 2 (two) times daily.   Allergies  Allergen Reactions  . Tessalon Perles [Benzonatate]     drowsy   Recent Results (from the past 2160 hour(s))  CBC w/Diff     Status: Abnormal   Collection Time: 02/03/17  3:46 PM  Result Value Ref Range   WBC 14.8 (H) 4.0 - 10.5 K/uL    RBC 4.97 4.22 - 5.81 Mil/uL   Hemoglobin 15.3 13.0 - 17.0 g/dL   HCT 44.5 39.0 - 52.0 %   MCV 89.6 78.0 - 100.0 fl   MCHC 34.3 30.0 - 36.0 g/dL   RDW 13.7 11.5 - 15.5 %   Platelets 309.0 150.0 - 400.0 K/uL   Neutrophils Relative % 73.5 43.0 - 77.0 %   Lymphocytes Relative 16.8 12.0 - 46.0 %   Monocytes Relative 6.2 3.0 - 12.0 %   Eosinophils Relative 2.6 0.0 - 5.0 %   Basophils Relative 0.9 0.0 - 3.0 %   Neutro Abs 10.9 (H) 1.4 - 7.7 K/uL   Lymphs Abs 2.5 0.7 - 4.0 K/uL   Monocytes Absolute 0.9 0.1 - 1.0 K/uL   Eosinophils Absolute 0.4 0.0 - 0.7 K/uL   Basophils Absolute 0.1 0.0 - 0.1 K/uL  Comp Met (CMET)     Status: None   Collection Time: 02/03/17  3:46 PM  Result Value Ref Range   Sodium 140 135 - 145 mEq/L   Potassium 4.7 3.5 - 5.1 mEq/L   Chloride 101 96 - 112 mEq/L   CO2 28 19 - 32 mEq/L   Glucose, Bld 99 70 - 99 mg/dL   BUN 13 6 - 23 mg/dL   Creatinine, Ser 1.29 0.40 - 1.50 mg/dL   Total Bilirubin 0.4 0.2 - 1.2 mg/dL   Alkaline Phosphatase 77 39 - 117 U/L   AST 20 0 - 37 U/L   ALT 16 0 - 53 U/L   Total Protein 7.5 6.0 - 8.3 g/dL   Albumin 4.6 3.5 - 5.2 g/dL   Calcium 9.9 8.4 - 10.5 mg/dL   GFR 62.24 >60.00 mL/min  TSH     Status: None   Collection Time: 02/03/17  3:46 PM  Result Value Ref Range   TSH 1.38 0.35 - 4.50 uIU/mL  PSA     Status: None   Collection Time: 02/03/17  3:46 PM  Result Value Ref Range   PSA 0.56 0.10 - 4.00 ng/mL    Comment: Test performed using Access Hybritech PSA Assay, a parmagnetic partical, chemiluminecent immunoassay.  CBC with Differential/Platelet     Status: Abnormal   Collection Time: 02/12/17 11:13 AM  Result Value Ref Range   WBC 13.2 (H) 4.0 - 10.5 K/uL   RBC 5.42 4.22 - 5.81 Mil/uL   Hemoglobin 16.6 13.0 - 17.0 g/dL   HCT 48.2 39.0 - 52.0 %  MCV 88.9 78.0 - 100.0 fl   MCHC 34.4 30.0 - 36.0 g/dL   RDW 13.5 11.5 - 15.5 %   Platelets 332.0 150.0 - 400.0 K/uL   Neutrophils Relative % 68.5 43.0 - 77.0 %   Lymphocytes  Relative 22.3 12.0 - 46.0 %   Monocytes Relative 5.6 3.0 - 12.0 %   Eosinophils Relative 2.9 0.0 - 5.0 %   Basophils Relative 0.7 0.0 - 3.0 %   Neutro Abs 9.0 (H) 1.4 - 7.7 K/uL   Lymphs Abs 2.9 0.7 - 4.0 K/uL   Monocytes Absolute 0.7 0.1 - 1.0 K/uL   Eosinophils Absolute 0.4 0.0 - 0.7 K/uL   Basophils Absolute 0.1 0.0 - 0.1 K/uL  C-reactive protein     Status: None   Collection Time: 02/12/17 11:13 AM  Result Value Ref Range   CRP 0.8 0.5 - 20.0 mg/dL  Sedimentation rate     Status: None   Collection Time: 02/12/17 11:13 AM  Result Value Ref Range   Sed Rate 17 0 - 20 mm/hr   Objective  Body mass index is 24.66 kg/m. Wt Readings from Last 3 Encounters:  03/14/17 176 lb 12.8 oz (80.2 kg)  02/27/17 173 lb 3.2 oz (78.6 kg)  02/20/17 174 lb 4 oz (79 kg)   Temp Readings from Last 3 Encounters:  03/14/17 98.1 F (36.7 C) (Oral)  02/27/17 98.4 F (36.9 C) (Oral)  02/20/17 98.7 F (37.1 C) (Oral)   BP Readings from Last 3 Encounters:  03/14/17 122/76  02/27/17 132/68  02/20/17 138/82   Pulse Readings from Last 3 Encounters:  03/14/17 76  02/27/17 74  02/20/17 77   O2 sat room air 98%   Physical Exam  Constitutional: He is oriented to person, place, and time and well-developed, well-nourished, and in no distress. Vital signs are normal.  HENT:  Head: Normocephalic and atraumatic.  Mouth/Throat: Oropharynx is clear and moist and mucous membranes are normal.  Eyes: Conjunctivae are normal. Pupils are equal, round, and reactive to light.  Cardiovascular: Normal rate, regular rhythm and normal heart sounds.  Pulmonary/Chest: Effort normal and breath sounds normal.  Neurological: He is alert and oriented to person, place, and time. Gait normal. Gait normal.  Skin: Skin is warm and dry.     Psychiatric: Mood, memory, affect and judgment normal.  Nursing note and vitals reviewed.   Assessment   1. Bronchitis resolved  2. Left thigh lesion  3.HM Plan  1. NTD 2.  Refer derm tbse and bx left thigh lesion  3.  declines flu shot  Tdap had 09/23/09  Consider shingrix in future disc in future   PSA 02/03/17 neg will f/u urology soon Colonoscopy needs to disc with PCP in future pt declines for now Former smoker  CBC, lipid, UA, hep B status today   Provider: Dr. Olivia Mackie McLean-Scocuzza-Internal Medicine

## 2017-03-14 NOTE — Progress Notes (Signed)
Pre visit review using our clinic review tool, if applicable. No additional management support is needed unless otherwise documented below in the visit note. 

## 2017-03-15 LAB — HEPATITIS B SURFACE ANTIBODY, QUANTITATIVE: Hepatitis B-Post: <5 m[IU]/mL — ABNORMAL LOW (ref >=10)

## 2017-03-15 LAB — HEPATITIS B SURFACE ANTIGEN: Hepatitis B Surface Ag: NONREACTIVE

## 2017-03-18 ENCOUNTER — Other Ambulatory Visit: Payer: Self-pay | Admitting: Family Medicine

## 2017-03-18 NOTE — Telephone Encounter (Signed)
Last OV 02/03/17 last filled 02/03/17 90 0rf

## 2017-03-20 ENCOUNTER — Other Ambulatory Visit: Payer: Self-pay | Admitting: Internal Medicine

## 2017-03-20 DIAGNOSIS — D72829 Elevated white blood cell count, unspecified: Secondary | ICD-10-CM

## 2017-03-21 DIAGNOSIS — Z125 Encounter for screening for malignant neoplasm of prostate: Secondary | ICD-10-CM | POA: Diagnosis not present

## 2017-03-21 DIAGNOSIS — E291 Testicular hypofunction: Secondary | ICD-10-CM | POA: Diagnosis not present

## 2017-03-28 ENCOUNTER — Encounter: Payer: 59 | Admitting: Oncology

## 2017-05-08 ENCOUNTER — Ambulatory Visit (INDEPENDENT_AMBULATORY_CARE_PROVIDER_SITE_OTHER): Payer: 59 | Admitting: Family Medicine

## 2017-05-08 ENCOUNTER — Other Ambulatory Visit: Payer: Self-pay

## 2017-05-08 ENCOUNTER — Encounter: Payer: Self-pay | Admitting: Family Medicine

## 2017-05-08 VITALS — BP 140/80 | HR 89 | Temp 98.6°F | Ht 71.0 in | Wt 175.0 lb

## 2017-05-08 DIAGNOSIS — F411 Generalized anxiety disorder: Secondary | ICD-10-CM | POA: Diagnosis not present

## 2017-05-08 DIAGNOSIS — M25531 Pain in right wrist: Secondary | ICD-10-CM

## 2017-05-08 DIAGNOSIS — M47896 Other spondylosis, lumbar region: Secondary | ICD-10-CM | POA: Diagnosis not present

## 2017-05-08 DIAGNOSIS — M5136 Other intervertebral disc degeneration, lumbar region: Secondary | ICD-10-CM | POA: Diagnosis not present

## 2017-05-08 DIAGNOSIS — D72829 Elevated white blood cell count, unspecified: Secondary | ICD-10-CM | POA: Diagnosis not present

## 2017-05-08 DIAGNOSIS — R634 Abnormal weight loss: Secondary | ICD-10-CM | POA: Diagnosis not present

## 2017-05-08 DIAGNOSIS — I10 Essential (primary) hypertension: Secondary | ICD-10-CM

## 2017-05-08 DIAGNOSIS — R252 Cramp and spasm: Secondary | ICD-10-CM | POA: Diagnosis not present

## 2017-05-08 DIAGNOSIS — G894 Chronic pain syndrome: Secondary | ICD-10-CM | POA: Diagnosis not present

## 2017-05-08 MED ORDER — ALPRAZOLAM 0.5 MG PO TABS: 0.5000 mg | ORAL_TABLET | Freq: Three times a day (TID) | ORAL | 1 refills | Status: DC | PRN

## 2017-05-08 NOTE — Assessment & Plan Note (Signed)
Patient reports repeat white blood cell count was normal at another physician's office.  He will get [place] the results.

## 2017-05-08 NOTE — Patient Instructions (Signed)
Nice to see you. We will get you referred to the new orthopedist. Please try to increase your water intake for your cramps. Please be careful if you are Xanax makes you drowsy.

## 2017-05-08 NOTE — Assessment & Plan Note (Signed)
Stable  Continue current regimen  

## 2017-05-08 NOTE — Assessment & Plan Note (Signed)
Refer to orthopedics at [facility].

## 2017-05-08 NOTE — Assessment & Plan Note (Signed)
Weight has been stable.  He feels he is gaining some back.  Previously referred for colon cancer screening though they declined this.  We will continue to discuss colon cancer screening at future visits.

## 2017-05-08 NOTE — Assessment & Plan Note (Signed)
Suspect related to inadequate fluid intake.  Advised to increase fluid intake.

## 2017-05-08 NOTE — Progress Notes (Signed)
Gregory Rumps, MD Phone: [phone]  [name] is a 52 y.o. male who presents today for f/u.  HYPERTENSION  Disease Monitoring  Home BP Monitoring 130/70s Chest pain- no    Dyspnea- no Medications  Compliance-  Taking lisinopril  Edema- no  Continues to have issues with his right wrist.  He has seen 3 or 4 orthopedic specialist and nobody seems to want to do anything about the issue and they continue to refer him back to his initial surgeon.  He wonders if they think that he is looking for them to say something damaging so that he can sue someone.  At this point he would like a referral to [facility] to see a Dr. Gershon Mussel.  Feels like something cramps in his hand and draws at his hand.  Hurts mostly in the ulnar aspect of his right hand.  Can barely hold a cup of coffee without pain.  Anxiety: Notes this is fairly well controlled and is stable on Xanax 1-3 times a day.  No drowsiness with this.  He does not drink alcohol.  He continues on Lexapro.  No depression.  He notes some occasional cramps in his legs is been going on for years.  He does not drink very much water.  He does use a muscle relaxer at times.    Social History   Tobacco Use  Smoking Status Former Smoker  . Types: Cigarettes  Smokeless Tobacco Never Used  Tobacco Comment   smoked age [age] stopped then age 70 x 1 years max 1/2 ppd      ROS see history of present illness  Objective  Physical Exam Vitals:   05/08/17 0820  BP: 140/80  Pulse: 89  Temp: 98.6 F (37 C)  SpO2: 98%    BP Readings from Last 3 Encounters:  05/08/17 140/80  03/14/17 122/76  02/27/17 132/68   Wt Readings from Last 3 Encounters:  05/08/17 175 lb (79.4 kg)  03/14/17 176 lb 12.8 oz (80.2 kg)  02/27/17 173 lb 3.2 oz (78.6 kg)    Physical Exam  Constitutional: No distress.  Cardiovascular: Normal rate, regular rhythm and normal heart sounds.  Pulmonary/Chest: Effort normal and breath sounds normal.    Musculoskeletal: He exhibits no edema.  Right wrist with slight tenderness over the dorsal ulnar aspect, no significant swelling, no warmth or erythema, 4/5 strength related to discomfort in his right wrist on grip testing, 5/5 grip strength on the left, bilateral hands are warm and well-perfused  Neurological: He is alert.  Skin: Skin is warm and dry. He is not diaphoretic.     Assessment/Plan: Please see individual problem list.  Right wrist pain Refer to orthopedics at [facility].  Generalized anxiety disorder Stable.  Continue current regimen.  Leukocytosis Patient reports repeat white blood cell count was normal at another physician's office.  He will get [place] the results.  Hypertension Has been adequately controlled at home.  Continue lisinopril.  Leg cramps Suspect related to inadequate fluid intake.  Advised to increase fluid intake.  Weight loss Weight has been stable.  He feels he is gaining some back.  Previously referred for colon cancer screening though they declined this.  We will continue to discuss colon cancer screening at future visits.    Orders Placed This Encounter  Procedures  . Ambulatory referral to Orthopedic Surgery    Referral Priority:   Routine    Referral Type:   Surgical    Referral Reason:  Specialty Services Required    Requested Specialty:   Orthopedic Surgery    Number of Visits Requested:   1    Meds ordered this encounter  Medications  . ALPRAZolam (XANAX) 0.5 MG tablet    Sig: Take 1 tablet (0.5 mg total) by mouth 3 (three) times daily as needed.    Dispense:  90 tablet    Refill:  1     Gregory Rumps, MD [place]

## 2017-05-08 NOTE — Assessment & Plan Note (Signed)
Has been adequately controlled at home.  Continue lisinopril.

## 2017-05-16 ENCOUNTER — Telehealth: Payer: Self-pay | Admitting: *Deleted

## 2017-05-16 DIAGNOSIS — M25531 Pain in right wrist: Secondary | ICD-10-CM

## 2017-05-16 NOTE — Telephone Encounter (Signed)
Referral placed.

## 2017-05-16 NOTE — Telephone Encounter (Signed)
Copied from [place] [phone]. Topic: General - Other >> May 16, 2017  2:19 PM Yvette Rack wrote: Reason for CRM: patient states that Dr Caryl Bis was referring him to a Orthopedic Surgery and that that provider couldn't get him in and was referred to another Doctor in [place] please refer pt to [place] at [facility] [phone]

## 2017-05-20 ENCOUNTER — Encounter: Payer: Self-pay | Admitting: Family Medicine

## 2017-07-04 ENCOUNTER — Ambulatory Visit
Admission: RE | Admit: 2017-07-04 | Discharge: 2017-07-04 | Disposition: A | Discharge status: Home / Self Care | Payer: Disability Insurance | Source: Ambulatory Visit | Attending: Dentistry | Admitting: Dentistry

## 2017-07-04 ENCOUNTER — Other Ambulatory Visit: Payer: Self-pay | Admitting: Dentistry

## 2017-07-04 DIAGNOSIS — M5416 Radiculopathy, lumbar region: Secondary | ICD-10-CM

## 2017-07-04 DIAGNOSIS — M5136 Other intervertebral disc degeneration, lumbar region: Secondary | ICD-10-CM | POA: Diagnosis not present

## 2017-07-04 DIAGNOSIS — M47816 Spondylosis without myelopathy or radiculopathy, lumbar region: Secondary | ICD-10-CM

## 2017-07-14 ENCOUNTER — Other Ambulatory Visit: Payer: Self-pay | Admitting: Family Medicine

## 2017-07-21 ENCOUNTER — Other Ambulatory Visit: Payer: Self-pay | Admitting: Family Medicine

## 2017-07-22 NOTE — Telephone Encounter (Signed)
Last OV 05/08/17 last filled 05/08/17 90 1rf

## 2017-07-24 NOTE — Telephone Encounter (Signed)
Sent to pharmacy.  Drug database reviewed. 

## 2017-08-12 DIAGNOSIS — M5136 Other intervertebral disc degeneration, lumbar region: Secondary | ICD-10-CM | POA: Diagnosis not present

## 2017-08-12 DIAGNOSIS — G894 Chronic pain syndrome: Secondary | ICD-10-CM | POA: Diagnosis not present

## 2017-08-12 DIAGNOSIS — M47896 Other spondylosis, lumbar region: Secondary | ICD-10-CM | POA: Diagnosis not present

## 2017-08-13 ENCOUNTER — Encounter: Payer: Self-pay | Admitting: Family Medicine

## 2017-08-13 ENCOUNTER — Other Ambulatory Visit: Payer: Self-pay

## 2017-08-13 ENCOUNTER — Ambulatory Visit (INDEPENDENT_AMBULATORY_CARE_PROVIDER_SITE_OTHER): Payer: 59 | Admitting: Family Medicine

## 2017-08-13 VITALS — BP 118/82 | HR 71 | Temp 98.5°F | Wt 173.0 lb

## 2017-08-13 DIAGNOSIS — D72829 Elevated white blood cell count, unspecified: Secondary | ICD-10-CM | POA: Diagnosis not present

## 2017-08-13 DIAGNOSIS — L989 Disorder of the skin and subcutaneous tissue, unspecified: Secondary | ICD-10-CM | POA: Diagnosis not present

## 2017-08-13 DIAGNOSIS — R252 Cramp and spasm: Secondary | ICD-10-CM | POA: Diagnosis not present

## 2017-08-13 DIAGNOSIS — M26609 Unspecified temporomandibular joint disorder, unspecified side: Secondary | ICD-10-CM

## 2017-08-13 DIAGNOSIS — M25531 Pain in right wrist: Secondary | ICD-10-CM

## 2017-08-13 DIAGNOSIS — I10 Essential (primary) hypertension: Secondary | ICD-10-CM | POA: Diagnosis not present

## 2017-08-13 DIAGNOSIS — Z1211 Encounter for screening for malignant neoplasm of colon: Secondary | ICD-10-CM

## 2017-08-13 LAB — BASIC METABOLIC PANEL
BUN: 12 mg/dL (ref 6–23)
CALCIUM: 9.8 mg/dL (ref 8.4–10.5)
CO2: 31 meq/L (ref 19–32)
Chloride: 102 mEq/L (ref 96–112)
Creatinine, Ser: 1.15 mg/dL (ref 0.40–1.50)
GFR: 70.92 mL/min (ref >60.00)
GLUCOSE: 99 mg/dL (ref 70–99)
Potassium: 4.5 mEq/L (ref 3.5–5.1)
Sodium: 139 mEq/L (ref 135–145)

## 2017-08-13 LAB — CBC WITH DIFFERENTIAL/PLATELET
Basophils Absolute: 0.1 10*3/uL (ref 0.0–0.1)
Basophils Relative: 1 % (ref 0.0–3.0)
EOS ABS: 0.3 10*3/uL (ref 0.0–0.7)
Eosinophils Relative: 3 % (ref 0.0–5.0)
HCT: 46 % (ref 39.0–52.0)
Hemoglobin: 15.9 g/dL (ref 13.0–17.0)
LYMPHS ABS: 2.2 10*3/uL (ref 0.7–4.0)
Lymphocytes Relative: 24.5 % (ref 12.0–46.0)
MCHC: 34.5 g/dL (ref 30.0–36.0)
MCV: 88.8 fl (ref 78.0–100.0)
MONO ABS: 0.5 10*3/uL (ref 0.1–1.0)
Monocytes Relative: 5.4 % (ref 3.0–12.0)
NEUTROS ABS: 5.9 10*3/uL (ref 1.4–7.7)
NEUTROS PCT: 66.1 % (ref 43.0–77.0)
PLATELETS: 270 10*3/uL (ref 150.0–400.0)
RBC: 5.18 Mil/uL (ref 4.22–5.81)
RDW: 13.1 % (ref 11.5–15.5)
WBC: 8.9 10*3/uL (ref 4.0–10.5)

## 2017-08-13 NOTE — Assessment & Plan Note (Signed)
Reportedly normal per patient.  We will recheck today.

## 2017-08-13 NOTE — Patient Instructions (Signed)
Nice to see you. You likely have TMJ dysfunction.  You need to see her dentist.  You can trial ibuprofen 600 mg every 8 hours as needed for discomfort. We will check lab work today and contact you with results. If the area on your back starts to drain pus, become red, develops pain, or any new symptoms please seek medical attention.

## 2017-08-13 NOTE — Assessment & Plan Note (Signed)
Patient had a bug bite.  This appears to be well-healing.  He will monitor this.

## 2017-08-13 NOTE — Assessment & Plan Note (Signed)
Check BMP.  Encouraged fluid intake.

## 2017-08-13 NOTE — Assessment & Plan Note (Signed)
He will continue to follow with his surgeon.

## 2017-08-13 NOTE — Assessment & Plan Note (Addendum)
He will see his dentist next week.  He can trial over-the-counter ibuprofen 600 mg every 8 hours as needed.

## 2017-08-13 NOTE — Assessment & Plan Note (Signed)
Well-controlled.  Check BMP.

## 2017-08-13 NOTE — Progress Notes (Signed)
[name], MD Phone: [phone]  Gregory Drown Sr. is a 51 y.o. male who presents today for f/u.  CC: anxiety, htn, wrist pain, TMJ issue  HYPERTENSION  Disease Monitoring  Home BP Monitoring well controlled Chest pain- no    Dyspnea- no Medications  Compliance-  Taking lisinopril.  Edema- no  Anxiety: Patient notes this fluctuates.  He does not feel he needs any different medications.  He takes Lexapro.  Also takes Xanax as prescribed.  It does not make him drowsy.  He does not drink alcohol.  No depression.  Patient continues to deal with his right wrist pain.  He has seen his surgeon and is working on getting disability.  He has not required any change in medications.  Patient reports issues with some discomfort in the area of his bilateral TMJ.  He notes eating does help.  Notes it feels a little stiff.  He has had this before.  He has had no fevers.  He did take some antibiotics recently as he thought he may have had a dental infection.  He has had no dental pain.  Has had no sweats.  Notes it is a little bit better today.  He has an appointment with his dentist next week.  He reports he got bit by a bug on his back and it developed a pustule.  He popped it.  It is doing well.    Social History   Tobacco Use  Smoking Status Former Smoker  . Types: Cigarettes  Smokeless Tobacco Never Used  Tobacco Comment   smoked age [age] stopped then age 14 x 1 years max 1/2 ppd      ROS see history of present illness  Objective  Physical Exam Vitals:   08/13/17 1033  BP: 118/82  Pulse: 71  Temp: 98.5 F (36.9 C)  SpO2: 98%    BP Readings from Last 3 Encounters:  08/13/17 118/82  05/08/17 140/80  03/14/17 122/76   Wt Readings from Last 3 Encounters:  08/13/17 173 lb (78.5 kg)  05/08/17 175 lb (79.4 kg)  03/14/17 176 lb 12.8 oz (80.2 kg)    Physical Exam  Constitutional: No distress.  Cardiovascular: Normal rate, regular rhythm and normal heart sounds.    Pulmonary/Chest: Effort normal and breath sounds normal.  Musculoskeletal: He exhibits no edema.  Neurological: He is alert.  Skin: Skin is warm and dry. He is not diaphoretic.  Scar with slight scab in right lumbar posterior back with no surrounding signs of erythema, no tenderness     Assessment/Plan: Please see individual problem list.  Leukocytosis Reportedly normal per patient.  We will recheck today.  Skin lesion Patient had a bug bite.  This appears to be well-healing.  He will monitor this.  Hypertension Well-controlled.  Check BMP.  Leg cramps Check BMP.  Encouraged fluid intake.  Right wrist pain He will continue to follow with his surgeon.  TMJ dysfunction He will see his dentist next week.  He can trial over-the-counter ibuprofen 600 mg every 8 hours as needed.     Health Maintenance: Referral to GI for colon cancer screening.  Orders Placed This Encounter  Procedures  . Basic Metabolic Panel (BMET)  . CBC w/Diff  . Ambulatory referral to Gastroenterology    Referral Priority:   Routine    Referral Type:   Consultation    Referral Reason:   Specialty Services Required    Number of Visits Requested:   1    No  orders of the defined types were placed in this encounter.    [name], MD [place]

## 2017-08-18 ENCOUNTER — Encounter: Payer: Self-pay | Admitting: *Deleted

## 2017-09-07 ENCOUNTER — Emergency Department: Payer: 59

## 2017-09-07 ENCOUNTER — Emergency Department
Admission: EM | Admit: 2017-09-07 | Discharge: 2017-09-07 | Disposition: A | Discharge status: Home / Self Care | Payer: 59 | Attending: Emergency Medicine | Admitting: Emergency Medicine

## 2017-09-07 ENCOUNTER — Other Ambulatory Visit: Payer: Self-pay

## 2017-09-07 DIAGNOSIS — Z79899 Other long term (current) drug therapy: Secondary | ICD-10-CM | POA: Diagnosis not present

## 2017-09-07 DIAGNOSIS — M26622 Arthralgia of left temporomandibular joint: Secondary | ICD-10-CM | POA: Insufficient documentation

## 2017-09-07 DIAGNOSIS — H1132 Conjunctival hemorrhage, left eye: Secondary | ICD-10-CM | POA: Insufficient documentation

## 2017-09-07 DIAGNOSIS — M26629 Arthralgia of temporomandibular joint, unspecified side: Secondary | ICD-10-CM

## 2017-09-07 DIAGNOSIS — I10 Essential (primary) hypertension: Secondary | ICD-10-CM | POA: Insufficient documentation

## 2017-09-07 DIAGNOSIS — Z7982 Long term (current) use of aspirin: Secondary | ICD-10-CM | POA: Insufficient documentation

## 2017-09-07 DIAGNOSIS — M542 Cervicalgia: Secondary | ICD-10-CM | POA: Diagnosis not present

## 2017-09-07 DIAGNOSIS — Z87891 Personal history of nicotine dependence: Secondary | ICD-10-CM | POA: Diagnosis not present

## 2017-09-07 DIAGNOSIS — R6884 Jaw pain: Secondary | ICD-10-CM | POA: Diagnosis present

## 2017-09-07 LAB — BASIC METABOLIC PANEL
Anion gap: 5 (ref 5–15)
BUN: 14 mg/dL (ref 6–20)
CALCIUM: 9.5 mg/dL (ref 8.9–10.3)
CHLORIDE: 106 mmol/L (ref 98–111)
CO2: 29 mmol/L (ref 22–32)
CREATININE: 1.08 mg/dL (ref 0.61–1.24)
GFR calc Af Amer: >60 mL/min (ref >60)
Glucose, Bld: 89 mg/dL (ref 70–99)
Potassium: 4.5 mmol/L (ref 3.5–5.1)
SODIUM: 140 mmol/L (ref 135–145)

## 2017-09-07 LAB — CBC
HCT: 48.9 % (ref 40.0–52.0)
Hemoglobin: 16.9 g/dL (ref 13.0–18.0)
MCH: 30.6 pg (ref 26.0–34.0)
MCHC: 34.5 g/dL (ref 32.0–36.0)
MCV: 88.6 fL (ref 80.0–100.0)
PLATELETS: 240 10*3/uL (ref 150–440)
RBC: 5.52 MIL/uL (ref 4.40–5.90)
RDW: 13 % (ref 11.5–14.5)
WBC: 11.6 10*3/uL — ABNORMAL HIGH (ref 3.8–10.6)

## 2017-09-07 MED ORDER — IOHEXOL 300 MG/ML  SOLN
75.0000 mL | Freq: Once | INTRAMUSCULAR | Status: AC | PRN
  Administered 2017-09-07: 75 mL via INTRAVENOUS

## 2017-09-07 NOTE — ED Triage Notes (Signed)
Pt c/o having issues with TMJ for the past 2 months and has been to his PCP and dentist with no relief from that. Pt c/o HA today and woke up with blood around the sclera today./ denies any visual changes at this time. Pt is a/ox3 in NAD on arrival. Pt is laughing and smiling in triage.

## 2017-09-07 NOTE — ED Provider Notes (Signed)
[facility] Emergency Department Provider Note  ____________________________________________  Time seen: Approximately 12:13 PM  I have reviewed the triage vital signs and the nursing notes.   HISTORY  Chief Complaint Eye Problem; Jaw Pain; and Headache   HPI Gregory MOONEYHAN Sr. is a 52 y.o. male with history as listed below who presents for evaluation of left jaw pain.  Patient reports that his symptoms have been ongoing for the last 2 months.  He has daily pain and swelling of the left TMJ.  Has seen his primary care doctor and dentist with no significant relief of his symptoms.  Patient reports that over the last few days the pain is now radiating to his left ear.  He denies dysphasia, dysarthria, numbness of his face.  He endorses mild 2 out of 10 pain at this time that is located at the left TMJ.  Pain is worse in the end of the day and sometimes he is unable to close his jaw by the end of the day.  He reports no prior history of smoking or family history of head and neck cancer.  No difficulty swallowing.  This morning he woke up with a left eye conjunctival hemorrhage which made him concerned enough to come to the emergency room.  He denies headache and reports that the pain is located in the left ear/TMJ region.  No coughing, no vomiting.  Past Medical History:  Diagnosis Date  . Anxiety   . Bronchitis   . GERD (gastroesophageal reflux disease)   . Hypertension   . Insomnia   . Low testosterone     Patient Active Problem List   Diagnosis Date Noted  . TMJ dysfunction 08/13/2017  . Bronchitis 03/02/2017  . Cough 02/20/2017  . Right wrist pain 02/03/2017  . Weight loss 06/14/2016  . Hypersomnia 04/22/2016  . Rash and nonspecific skin eruption 04/15/2016  . Leg cramps 04/15/2016  . Musculoskeletal chest pain 11/02/2015  . Skin lesion 04/14/2013  . Chronic low back pain 04/14/2013  . Leukocytosis 08/04/2012  . Generalized anxiety disorder  08/04/2012  . Hypogonadism male 10/24/2011  . Hypertension 09/24/2011  . GERD (gastroesophageal reflux disease) 09/24/2011  . Erectile dysfunction 09/24/2011    Past Surgical History:  Procedure Laterality Date  . NASAL SINUS SURGERY     Dr. Carlis Abbott  . Unremarkable      Prior to Admission medications   Medication Sig Start Date End Date Taking? Authorizing Provider  albuterol (PROVENTIL HFA;VENTOLIN HFA) 108 (90 Base) MCG/ACT inhaler Inhale 1-2 puffs into the lungs every 6 (six) hours as needed for wheezing or shortness of breath. 02/27/17   McLean-Scocuzza, Nino Glow, MD  ALPRAZolam Duanne Moron) 0.5 MG tablet TAKE 1 TABLET(0.5 MG) BY MOUTH THREE TIMES DAILY AS NEEDED 07/24/17   Leone Haven, MD  amitriptyline (ELAVIL) 50 MG tablet TK 1 T PO QD 03/07/17   [provider]  aspirin 81 MG tablet Take 81 mg by mouth daily.    [provider]  doxycycline (VIBRA-TABS) 100 MG tablet Take 1 tablet (100 mg total) by mouth 2 (two) times daily. 02/27/17   McLean-Scocuzza, Nino Glow, MD  escitalopram (LEXAPRO) 10 MG tablet TAKE 1 TABLET BY MOUTH EVERY DAY 12/20/16   Leone Haven, MD  gabapentin (NEURONTIN) 300 MG capsule Take 300 mg by mouth 3 (three) times daily.    [provider]  ketorolac (TORADOL) 10 MG tablet Take 1 tablet (10 mg total) by mouth every 8 (eight)  hours. 08/12/15   Menshew, Dannielle Karvonen, PA-C  lisinopril (PRINIVIL,ZESTRIL) 20 MG tablet TAKE 1 TABLET BY MOUTH EVERY DAY 03/05/17   Leone Haven, MD  magnesium oxide (MAG-OX) 400 MG tablet Take 400 mg by mouth daily.    [provider]  meloxicam (MOBIC) 15 MG tablet TAKE 1 TABLET BY MOUTH EVERY DAY 05/08/12   Jackolyn Confer, MD  Multiple Vitamin (MULTIVITAMIN) tablet Take 1 tablet by mouth daily.    [provider]  omeprazole (PRILOSEC) 40 MG capsule TAKE 1 CAPSULE BY MOUTH EVERY DAY 07/15/17   Leone Haven, MD  orphenadrine (NORFLEX) 100 MG tablet Take 1 tablet (100 mg total) by  mouth 2 (two) times daily as needed for muscle spasms. 08/12/15   Menshew, Dannielle Karvonen, PA-C  Oxycodone HCl 10 MG TABS Take by mouth. Take one 3 to 5 times a day    [provider]  sildenafil (VIAGRA) 100 MG tablet Take 50 mg by mouth daily as needed.    [provider]  testosterone cypionate (DEPOTESTOTERONE CYPIONATE) 100 MG/ML injection Inject 100 mg into the muscle every 14 (fourteen) days. For IM use only    [provider]  tiZANidine (ZANAFLEX) 4 MG capsule Take 4 mg by mouth 3 (three) times daily.    [provider]  triamcinolone cream (KENALOG) 0.1 % Apply 1 application topically 2 (two) times daily. 04/15/16   Leone Haven, MD    Allergies Tessalon perles [benzonatate]  Family History  Problem Relation Age of Onset  . Lung cancer Father        deceased    Social History Social History   Tobacco Use  . Smoking status: Former Smoker    Types: Cigarettes  . Smokeless tobacco: Never Used  . Tobacco comment: smoked age [age] stopped then age 30 x 1 years max 1/2 ppd   Substance Use Topics  . Alcohol use: No    Alcohol/week: 0.0 standard drinks  . Drug use: No    Review of Systems  Constitutional: Negative for fever. Eyes: Negative for visual changes. ENT: Negative for sore throat. + pain and swelling of the R TMJ Neck: No neck pain  Cardiovascular: Negative for chest pain. Respiratory: Negative for shortness of breath. Gastrointestinal: Negative for abdominal pain, vomiting or diarrhea. Genitourinary: Negative for dysuria. Musculoskeletal: Negative for back pain. Skin: Negative for rash. Neurological: Negative for headaches, weakness or numbness. Psych: No SI or HI  ____________________________________________   PHYSICAL EXAM:  VITAL SIGNS: ED Triage Vitals  Enc Vitals Group     BP 09/07/17 0911 (!) 150/94     Pulse Rate 09/07/17 0911 64     Resp 09/07/17 0911 15     Temp 09/07/17 0911 98.3 F (36.8 C)      Temp Source 09/07/17 0911 Oral     SpO2 09/07/17 0911 100 %     Weight 09/07/17 0911 174 lb (78.9 kg)     Height 09/07/17 0911 5\' 11"  (1.803 m)     Head Circumference --      Peak Flow --      Pain Score 09/07/17 0925 3     Pain Loc --      Pain Edu? --      Excl. in [place]? --     Constitutional: Alert and oriented. Well appearing and in no apparent distress. HEENT:      Head: Normocephalic and atraumatic.  Eyes: Conjunctivae are normal. Sclera is non-icteric. L conjunctival hemorrhage, EOMI, PERRL      Mouth/Throat: Mucous membranes are moist. Patient has mild swelling with no erythema or significant tenderness of the left TMJ, no crepitus      Ears: TMs are visualized and clear bilaterally      Neck: Supple with no signs of meningismus. Cardiovascular: Regular rate and rhythm. No murmurs, gallops, or rubs. 2+ symmetrical distal pulses are present in all extremities. No JVD. Respiratory: Normal respiratory effort. Lungs are clear to auscultation bilaterally. No wheezes, crackles, or rhonchi.  Musculoskeletal: Nontender with normal range of motion in all extremities. No edema, cyanosis, or erythema of extremities. Neurologic: Normal speech and language. Patient with intermittent asymmetric elevation of the palate Skin: Skin is warm, dry and intact. No rash noted. Psychiatric: Mood and affect are normal. Speech and behavior are normal.  ____________________________________________   LABS (all labs ordered are listed, but only abnormal results are displayed)  Labs Reviewed  CBC - Abnormal; Notable for the following components:      Result Value   WBC 11.6 (*)    All other components within normal limits  BASIC METABOLIC PANEL   ____________________________________________  EKG  none  ____________________________________________  RADIOLOGY  I have personally reviewed the images performed during this visit and I agree with the Radiologist's read.   Interpretation by  Radiologist:  Ct Soft Tissue Neck W Contrast  Result Date: 09/07/2017 CLINICAL DATA:  Neck pain. Left jaw swelling and stiffness. Left facial swelling EXAM: CT NECK WITH CONTRAST TECHNIQUE: Multidetector CT imaging of the neck was performed using the standard protocol following the bolus administration of intravenous contrast. CONTRAST:  65mL OMNIPAQUE IOHEXOL 300 MG/ML  SOLN COMPARISON:  CT neck 09/07/2017 FINDINGS: Pharynx and larynx: Normal. No mass or swelling. Salivary glands: No inflammation, mass, or stone. Thyroid: Negative Lymph nodes: No enlarged lymph nodes in the neck. Vascular: Negative.  Normal vascular enhancement. Limited intracranial: Negative Visualized orbits: Negative Mastoids and visualized paranasal sinuses: Negative Skeleton: Mild cervical spine degenerative changes. No acute skeletal abnormality. No evidence of dental abscess. Upper chest: Negative Other: No significant soft tissue swelling in the face IMPRESSION: Negative CT neck. Negative for mass or adenopathy. No soft tissue swelling. Electronically Signed   By: Franchot Gallo M.D.   On: 09/07/2017 13:51   Ct Maxillofacial Wo Contrast  Result Date: 09/07/2017 CLINICAL DATA:  Approximate 8 week history of swelling and stiffness involving the LEFT jaw with subsequent development of LEFT ear pain. Patient awoke today with bleeding in the LEFT eye. Patient states that he has been to the dentist about the jaw pain but no relief. Patient denies visual changes. EXAM: CT MAXILLOFACIAL WITHOUT CONTRAST TECHNIQUE: Multidetector CT imaging of the maxillofacial structures was performed. Multiplanar CT image reconstructions were also generated. A metallic BB was placed on the right temple in order to reliably differentiate right from left. COMPARISON:  None. FINDINGS: Osseous: Anatomic alignment of the BILATERAL temporomandibular joints. Narrowing of the RIGHT TMJ joint space with mild spurring involving the RIGHT mandibular condyle.  Well-preserved LEFT TMJ joint space with spurring involving the LEFT mandibular condyle. No intrinsic osseous abnormalities involving the mandible, maxilla or any of the other facial bones. No visible dental disease. Orbits: Both orbits and both globes normal in appearance. Sinuses: Minimal, insignificant mucosal thickening involving the base of the RIGHT frontal sinus. Remaining paranasal sinuses, BILATERAL mastoid air cells and BILATERAL middle ear cavities well-aerated. Soft tissues: No soft tissue masses  involving the visualized face. Symmetric BILATERAL parotid glands with well-preserved normal fat planes. No pathologic lymphadenopathy. Limited intracranial: Unremarkable. IMPRESSION: 1. Mild degenerative changes involving the temporomandibular joints, RIGHT greater than LEFT. 2. No significant abnormality otherwise. If further imaging follow-up is felt necessary, the best imaging modality to evaluate the TMJs is MRI without contrast. This could be done on a non-emergent basis if needed. Electronically Signed   By: Evangeline Dakin M.D.   On: 09/07/2017 12:14      ____________________________________________   PROCEDURES  Procedure(s) performed: None Procedures Critical Care performed:  None ____________________________________________   INITIAL IMPRESSION / ASSESSMENT AND PLAN / ED COURSE  52 y.o. male with history as listed below who presents for evaluation of left jaw pain.  Patient symptoms have been ongoing for 2 months, he does have mild swelling but no tenderness of the left TMJ.  Most concerning part of my exam was noticing the patient has asymmetric depression of his tongue and elevation of his palate however this finding has been intermittent here in the emergency room.  I have asked patient to say "A" and noticed this asymmetry 2 times out of 4 he said A. There are no other CN deficits on exam. CT has been ordered.    ._________________________ 12:29 PM on  09/07/2017 -----------------------------------------  CT maxilloface negative.  Discuss my findings with Dr. Honor Junes from ENT who recommended CT of the neck with and without contrast to rule out a mass and if that is negative close follow-up with ENT as an outpatient.  _________________________ 2:24 PM on 09/07/2017 -----------------------------------------  CT of the neck is negative for any masses.  At this time patient will be discharged home with close follow-up with ENT for further evaluation.  As part of my medical decision making, I reviewed the following data within the [facility] notes reviewed and incorporated, Labs reviewed , Radiograph reviewed , A consult was requested and obtained from this/these consultant(s) ENT, Notes from prior ED visits and [place] Controlled Substance Database    Pertinent labs & imaging results that were available during my care of the patient were reviewed by me and considered in my medical decision making (see chart for details).    ____________________________________________   FINAL CLINICAL IMPRESSION(S) / ED DIAGNOSES  Final diagnoses:  TMJ pain dysfunction syndrome      NEW MEDICATIONS STARTED DURING THIS VISIT:  ED Discharge Orders    None       Note:  This document was prepared using Dragon voice recognition software and may include unintentional dictation errors.    Alfred Levins, [place], MD 09/07/17 [phone]

## 2017-09-07 NOTE — ED Notes (Addendum)
Pt states he is "not a headache person" but has a slight headache today and has been getting mild headaches since onset of symptoms. Pt has visible swelling in left side of jaw, left eye is bright red about 75% of eye

## 2017-10-20 ENCOUNTER — Other Ambulatory Visit: Payer: Self-pay | Admitting: Family Medicine

## 2017-10-22 ENCOUNTER — Encounter: Payer: Self-pay | Admitting: Family Medicine

## 2017-10-22 NOTE — Telephone Encounter (Signed)
Last OV 08/13/2017   Last refilled 07/24/2017 disp 90 with 1 refill   Sent to PCP for approval

## 2017-10-22 NOTE — Telephone Encounter (Signed)
Controlled substance database reviewed. Sent to pharmacy.   

## 2017-11-28 ENCOUNTER — Other Ambulatory Visit: Payer: Self-pay | Admitting: Family Medicine

## 2017-11-28 NOTE — Telephone Encounter (Signed)
Controlled substance database reviewed. Sent to pharmacy.   

## 2017-12-04 ENCOUNTER — Other Ambulatory Visit: Payer: Self-pay | Admitting: Family Medicine

## 2017-12-20 ENCOUNTER — Other Ambulatory Visit: Payer: Self-pay | Admitting: Family Medicine

## 2017-12-30 ENCOUNTER — Other Ambulatory Visit: Payer: Self-pay

## 2017-12-30 MED ORDER — OMEPRAZOLE 40 MG PO CPDR: 40.0000 mg | DELAYED_RELEASE_CAPSULE | Freq: Every day | ORAL | 1 refills | Status: DC

## 2018-01-01 DIAGNOSIS — M47896 Other spondylosis, lumbar region: Secondary | ICD-10-CM | POA: Diagnosis not present

## 2018-01-01 DIAGNOSIS — Z5181 Encounter for therapeutic drug level monitoring: Secondary | ICD-10-CM | POA: Diagnosis not present

## 2018-01-01 DIAGNOSIS — M5136 Other intervertebral disc degeneration, lumbar region: Secondary | ICD-10-CM | POA: Diagnosis not present

## 2018-01-01 DIAGNOSIS — G894 Chronic pain syndrome: Secondary | ICD-10-CM | POA: Diagnosis not present

## 2018-01-01 DIAGNOSIS — Z79899 Other long term (current) drug therapy: Secondary | ICD-10-CM | POA: Diagnosis not present

## 2018-02-02 ENCOUNTER — Other Ambulatory Visit: Payer: Self-pay | Admitting: Family Medicine

## 2018-02-03 NOTE — Telephone Encounter (Signed)
Last OV   Last refilled

## 2018-02-03 NOTE — Telephone Encounter (Signed)
Last OV 10/22/2017   Last refilled 11/28/2017 disp 90 with no refills    Sent to PCP to advise

## 2018-02-03 NOTE — Telephone Encounter (Signed)
Controlled substance database reviewed.  Refill sent to pharmacy.  Please contact the patient to get him scheduled for follow-up in 3 months as he does not have an appointment currently scheduled.

## 2018-03-07 ENCOUNTER — Other Ambulatory Visit: Payer: Self-pay | Admitting: Family Medicine

## 2018-03-26 ENCOUNTER — Other Ambulatory Visit: Payer: Self-pay | Admitting: Family Medicine

## 2018-03-27 NOTE — Telephone Encounter (Signed)
Please call the patient and get him him scheduled for a follow-up visit.  I will send 1 refill in will he will need to have a follow-up visit scheduled to receive further refills.

## 2018-03-27 NOTE — Telephone Encounter (Signed)
Last OV 08/13/2017   Last refilled 02/03/2018 disp 90 with no refills   Next appt none scheduled

## 2018-05-29 ENCOUNTER — Other Ambulatory Visit: Payer: Self-pay | Admitting: Family Medicine

## 2018-06-10 ENCOUNTER — Other Ambulatory Visit: Payer: Self-pay | Admitting: Family Medicine

## 2018-06-15 ENCOUNTER — Encounter: Payer: Self-pay | Admitting: Family Medicine

## 2018-06-15 ENCOUNTER — Ambulatory Visit (INDEPENDENT_AMBULATORY_CARE_PROVIDER_SITE_OTHER): Payer: 59 | Admitting: Family Medicine

## 2018-06-15 ENCOUNTER — Other Ambulatory Visit: Payer: Self-pay

## 2018-06-15 DIAGNOSIS — R252 Cramp and spasm: Secondary | ICD-10-CM | POA: Diagnosis not present

## 2018-06-15 DIAGNOSIS — M5441 Lumbago with sciatica, right side: Secondary | ICD-10-CM

## 2018-06-15 DIAGNOSIS — I10 Essential (primary) hypertension: Secondary | ICD-10-CM

## 2018-06-15 DIAGNOSIS — F411 Generalized anxiety disorder: Secondary | ICD-10-CM

## 2018-06-15 DIAGNOSIS — M25531 Pain in right wrist: Secondary | ICD-10-CM

## 2018-06-15 DIAGNOSIS — G8929 Other chronic pain: Secondary | ICD-10-CM

## 2018-06-15 MED ORDER — ALPRAZOLAM 0.5 MG PO TABS: 0.5000 mg | ORAL_TABLET | Freq: Three times a day (TID) | ORAL | 0 refills | Status: DC | PRN

## 2018-06-15 MED ORDER — GABAPENTIN 400 MG PO CAPS: 800.0000 mg | ORAL_CAPSULE | Freq: Three times a day (TID) | ORAL | 1 refills | Status: DC

## 2018-06-15 MED ORDER — TIZANIDINE HCL 4 MG PO CAPS: 4.0000 mg | ORAL_CAPSULE | Freq: Two times a day (BID) | ORAL | 0 refills | Status: DC | PRN

## 2018-06-15 NOTE — Assessment & Plan Note (Signed)
Chronic issue.  We will see if physical medicine rehab has anything to offer regarding this.

## 2018-06-15 NOTE — Assessment & Plan Note (Signed)
Stable. Continue Xanax. °

## 2018-06-15 NOTE — Assessment & Plan Note (Addendum)
Chronic issue.  Seems to have nerve impingement with sciatica-like symptoms.  We will order an MRI lumbar spine to evaluate.  We will additionally refer to physical medicine and rehab.  Continue gabapentin.  Continue Zanaflex and I did advise him that he could take 2 doses daily separated by 6 hours.

## 2018-06-15 NOTE — Assessment & Plan Note (Signed)
Check electrolytes and magnesium. 

## 2018-06-15 NOTE — Progress Notes (Signed)
Virtual Visit via video Note  This visit type was conducted due to national recommendations for restrictions regarding the COVID-19 pandemic (e.g. social distancing).  This format is felt to be most appropriate for this patient at this time.  All issues noted in this document were discussed and addressed.  No physical exam was performed (except for noted visual exam findings with Video Visits).   I connected with Gregory Stokes today at 10:30 AM EDT by a video enabled telemedicine application and verified that I am speaking with the correct person using two identifiers. Location patient: wife's office Location provider: work Persons participating in the virtual visit: patient, provider, Stevan Eberwein (wife)  I discussed the limitations, risks, security and privacy concerns of performing an evaluation and management service by telephone and the availability of in person appointments. I also discussed with the patient that there may be a patient responsible charge related to this service. The patient expressed understanding and agreed to proceed.   Reason for visit: follow-up  HPI: Chronic back and wrist pain: Patient had stopped going to the pain clinic in [place].  He feels like things were not going well while he was seeing them.  His wrist became infected several times.  He had 2 surgeries that went wrong on his wrist as well.  He does note he had a lawyer look into this and they did recommend proceeding with a lawsuit though the patient was not able to undergo this as he would have had to get a second opinion that he was not able to do.  He has come off of his narcotics.  He has been off of them for 3 months.  He continues on gabapentin and Zanaflex.  He notes the posterior aspect of his right leg does seem to go to sleep at times and feels like he gets an electric shock sensation.  He is taking Tylenol for pain.  He notes no weakness.  No incontinence.  He does not want to proceed with any further  surgery at this time.  He does report occasional cramps in his legs.  He has been taking magnesium for those as well.  Anxiety: He notes this is controlled.  No depression.  He takes Xanax 3 times daily.  No drowsiness or alcohol intake with this.  He is no longer on Lexapro or Elavil.  Hypertension: Typically 128-132/70s.  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  No chest pain, shortness of breath, or edema.   ROS: See pertinent positives and negatives per HPI.  Past Medical History:  Diagnosis Date   Anxiety    Bronchitis    GERD (gastroesophageal reflux disease)    Hypertension    Insomnia    Low testosterone     Past Surgical History:  Procedure Laterality Date   NASAL SINUS SURGERY     Dr. Scheryl Marten      Family History  Problem Relation Age of Onset   Lung cancer Father        deceased    SOCIAL HX: Former smoker   Current Outpatient Medications:    albuterol (PROVENTIL HFA;VENTOLIN HFA) 108 (90 Base) MCG/ACT inhaler, Inhale 1-2 puffs into the lungs every 6 (six) hours as needed for wheezing or shortness of breath., Disp: 1 Inhaler, Rfl: 1   ALPRAZolam (XANAX) 0.5 MG tablet, Take 1 tablet (0.5  mg total) by mouth 3 (three) times daily as needed for anxiety. Needs follow-up to receive future refills.  Please contact office., Disp: 90 tablet, Rfl: 0   aspirin 81 MG tablet, Take 81 mg by mouth daily., Disp: , Rfl:    gabapentin (NEURONTIN) 400 MG capsule, Take 2 capsules (800 mg total) by mouth 3 (three) times daily., Disp: 540 capsule, Rfl: 1   lisinopril (PRINIVIL,ZESTRIL) 20 MG tablet, TAKE 1 TABLET BY MOUTH EVERY DAY, Disp: 90 tablet, Rfl: 0   magnesium oxide (MAG-OX) 400 MG tablet, Take 400 mg by mouth daily., Disp: , Rfl:    Multiple Vitamin (MULTIVITAMIN) tablet, Take 1 tablet by mouth daily., Disp: , Rfl:    omeprazole (PRILOSEC) 40 MG capsule,  TAKE ONE CAPSULE BY MOUTH EVERY DAY, Disp: 150 capsule, Rfl: 1   sildenafil (VIAGRA) 100 MG tablet, Take 50 mg by mouth daily as needed., Disp: , Rfl:    testosterone cypionate (DEPOTESTOTERONE CYPIONATE) 100 MG/ML injection, Inject 100 mg into the muscle every 14 (fourteen) days. For IM use only, Disp: , Rfl:    tiZANidine (ZANAFLEX) 4 MG capsule, Take 1 capsule (4 mg total) by mouth 2 (two) times daily as needed for muscle spasms., Disp: 60 capsule, Rfl: 0   triamcinolone cream (KENALOG) 0.1 %, Apply 1 application topically 2 (two) times daily., Disp: 30 g, Rfl: 0  EXAM:  VITALS per patient if applicable: None  GENERAL: alert, oriented, appears well and in no acute distress  HEENT: atraumatic, conjunttiva clear, no obvious abnormalities on inspection of external nose and ears  NECK: normal movements of the head and neck  LUNGS: on inspection no signs of respiratory distress, breathing rate appears normal, no obvious gross SOB, gasping or wheezing  CV: no obvious cyanosis  MS: moves all visible extremities without noticeable abnormality  PSYCH/NEURO: pleasant and cooperative, no obvious depression or anxiety, speech and thought processing grossly intact  ASSESSMENT AND PLAN:  Discussed the following assessment and plan:  Essential hypertension - Plan: Comp Met (CMET), Lipid panel, CANCELED: Basic Metabolic Panel (BMET)  Leg cramps - Plan: Magnesium, tiZANidine (ZANAFLEX) 4 MG capsule  Chronic right-sided low back pain with right-sided sciatica - Plan: gabapentin (NEURONTIN) 400 MG capsule, tiZANidine (ZANAFLEX) 4 MG capsule, Ambulatory referral to Physical Medicine Rehab, MR Lumbar Spine Wo Contrast  Right wrist pain - Plan: Ambulatory referral to Physical Medicine Rehab  Generalized anxiety disorder - Plan: ALPRAZolam (XANAX) 0.5 MG tablet  Hypertension The patient is requesting to come off of blood pressure medication.  I did discuss with him that his blood pressure may  be adequately controlled because he is on the medication.  I advised that he could half his dose and see what his blood pressure does over the next month.  If his blood pressure goes up he will need to go back on his current dosing.  He will be scheduled for lab work.  Chronic low back pain Chronic issue.  Seems to have nerve impingement with sciatica-like symptoms.  We will order an MRI lumbar spine to evaluate.  We will additionally refer to physical medicine and rehab.  Continue gabapentin.  Continue Zanaflex and I did advise him that he could take 2 doses daily separated by 6 hours.  Right wrist pain Chronic issue.  We will see if physical medicine rehab has anything to offer regarding this.  Leg cramps Check electrolytes and magnesium.  Generalized anxiety disorder Stable.  Continue Xanax.  [place] office staff will contact the  patient to get him scheduled for follow-up and labs.  Social distancing precautions and sick precautions given regarding COVID-19.   I discussed the assessment and treatment plan with the patient. The patient was provided an opportunity to ask questions and all were answered. The patient agreed with the plan and demonstrated an understanding of the instructions.   The patient was advised to call back or seek an in-person evaluation if the symptoms worsen or if the condition fails to improve as anticipated.   [name], MD

## 2018-06-15 NOTE — Assessment & Plan Note (Signed)
The patient is requesting to come off of blood pressure medication.  I did discuss with him that his blood pressure may be adequately controlled because he is on the medication.  I advised that he could half his dose and see what his blood pressure does over the next month.  If his blood pressure goes up he will need to go back on his current dosing.  He will be scheduled for lab work.

## 2018-06-26 ENCOUNTER — Ambulatory Visit: Payer: 59

## 2018-06-30 ENCOUNTER — Other Ambulatory Visit: Payer: Self-pay | Admitting: Family Medicine

## 2018-07-24 ENCOUNTER — Other Ambulatory Visit: Payer: Self-pay | Admitting: Family Medicine

## 2018-07-24 DIAGNOSIS — F411 Generalized anxiety disorder: Secondary | ICD-10-CM

## 2018-07-24 MED ORDER — ALPRAZOLAM 0.5 MG PO TABS: 0.5000 mg | ORAL_TABLET | Freq: Three times a day (TID) | ORAL | 0 refills | Status: DC | PRN

## 2018-07-24 NOTE — Addendum Note (Signed)
Addended by: Leone Haven on: 07/24/2018 06:43 PM   Modules accepted: Orders

## 2018-08-10 ENCOUNTER — Other Ambulatory Visit: Payer: Self-pay | Admitting: Family Medicine

## 2018-08-10 DIAGNOSIS — R252 Cramp and spasm: Secondary | ICD-10-CM

## 2018-08-10 DIAGNOSIS — G8929 Other chronic pain: Secondary | ICD-10-CM

## 2018-08-14 ENCOUNTER — Telehealth: Payer: Self-pay | Admitting: Family Medicine

## 2018-08-14 NOTE — Telephone Encounter (Signed)
Pt need to know when his BP medication was increased. Please advise pt. He didn't know the name of the medication

## 2018-08-14 NOTE — Telephone Encounter (Signed)
lmtcb ok for pec to advise.  Aison Malveaux,cma

## 2018-08-25 ENCOUNTER — Other Ambulatory Visit: Payer: Self-pay | Admitting: Family Medicine

## 2018-08-25 DIAGNOSIS — F411 Generalized anxiety disorder: Secondary | ICD-10-CM

## 2018-08-25 MED ORDER — ALPRAZOLAM 0.5 MG PO TABS: 0.5000 mg | ORAL_TABLET | Freq: Three times a day (TID) | ORAL | 0 refills | Status: DC | PRN

## 2018-09-04 IMAGING — DX DG CHEST 2V
2 series · 2 of 2 positions shown · non-contrast
Comparison: 08/12/2015

CLINICAL DATA: Cough

EXAM:
CHEST  2 VIEW

[chest pa]
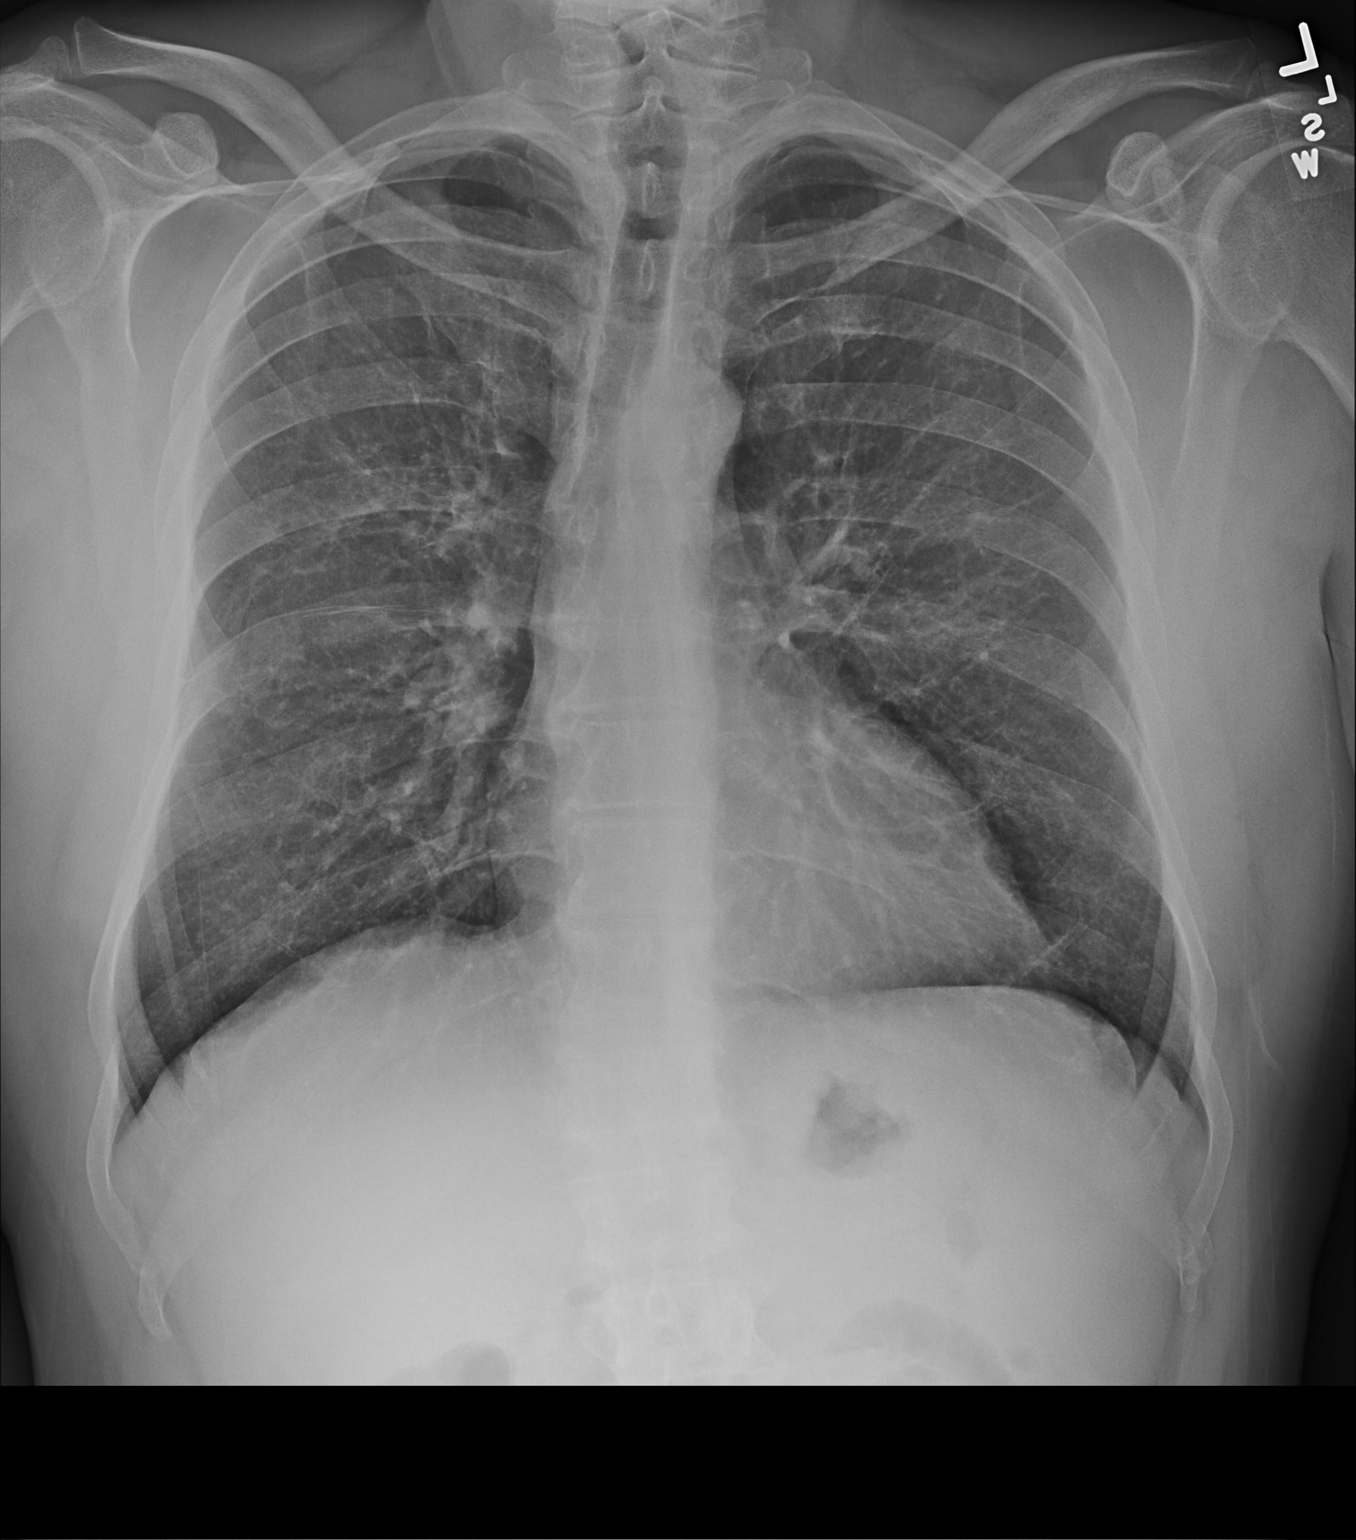

[chest lat]
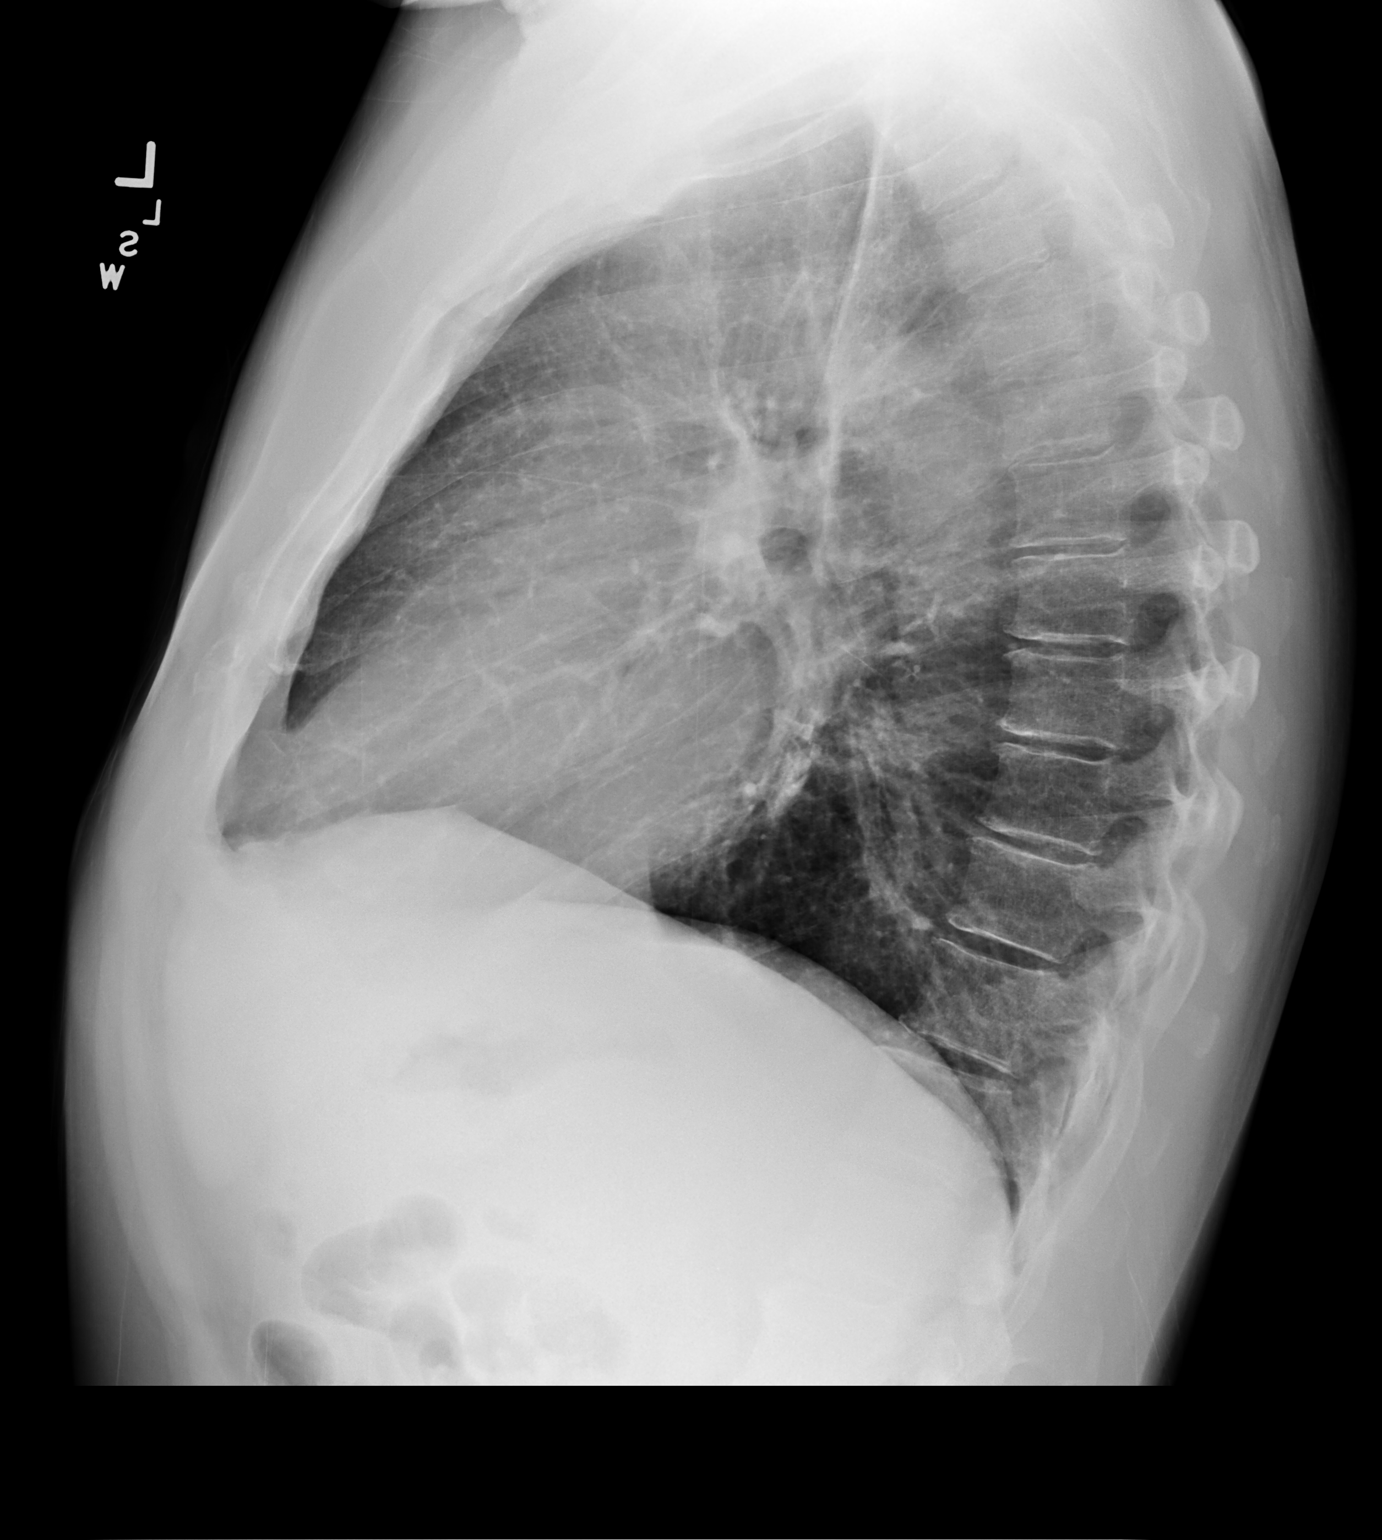

[2 of 2 positions shown; findings below may reference images not displayed]

FINDINGS: Heart and mediastinal contours are within normal limits. No focal
opacities or effusions. No acute bony abnormality. Mild
peribronchial thickening.
IMPRESSION: Mild bronchitic changes.

## 2018-09-28 ENCOUNTER — Other Ambulatory Visit: Payer: Self-pay

## 2018-09-28 ENCOUNTER — Telehealth: Payer: Self-pay | Admitting: Family Medicine

## 2018-09-28 NOTE — Telephone Encounter (Signed)
This patient has not had lab work in [place] year. He has labs ordered that need to be completed. This medication should have been sent to me for review prior to being refilled. Please call the patient to get his labs scheduled. He will not be able to get another refill until his labs have been completed. Thanks.

## 2018-09-29 NOTE — Telephone Encounter (Signed)
I called and schedule labs with the patient.  Fatimata Talsma,cma

## 2018-09-30 ENCOUNTER — Other Ambulatory Visit: Payer: Self-pay | Admitting: Family Medicine

## 2018-09-30 DIAGNOSIS — F411 Generalized anxiety disorder: Secondary | ICD-10-CM

## 2018-10-13 ENCOUNTER — Other Ambulatory Visit: Payer: Self-pay | Admitting: Family Medicine

## 2018-10-13 DIAGNOSIS — R252 Cramp and spasm: Secondary | ICD-10-CM

## 2018-10-13 DIAGNOSIS — G8929 Other chronic pain: Secondary | ICD-10-CM

## 2018-10-14 ENCOUNTER — Other Ambulatory Visit (INDEPENDENT_AMBULATORY_CARE_PROVIDER_SITE_OTHER): Payer: 59

## 2018-10-14 ENCOUNTER — Other Ambulatory Visit: Payer: Self-pay

## 2018-10-14 DIAGNOSIS — G8929 Other chronic pain: Secondary | ICD-10-CM

## 2018-10-14 DIAGNOSIS — R252 Cramp and spasm: Secondary | ICD-10-CM

## 2018-10-14 DIAGNOSIS — I10 Essential (primary) hypertension: Secondary | ICD-10-CM

## 2018-10-14 DIAGNOSIS — M5441 Lumbago with sciatica, right side: Secondary | ICD-10-CM | POA: Diagnosis not present

## 2018-10-14 LAB — COMPREHENSIVE METABOLIC PANEL
ALT: 15 U/L (ref 0–53)
AST: 17 U/L (ref 0–37)
Albumin: 4.7 g/dL (ref 3.5–5.2)
Alkaline Phosphatase: 83 U/L (ref 39–117)
BUN: 16 mg/dL (ref 6–23)
CO2: 28 mEq/L (ref 19–32)
Calcium: 10.1 mg/dL (ref 8.4–10.5)
Chloride: 103 mEq/L (ref 96–112)
Creatinine, Ser: 1.17 mg/dL (ref 0.40–1.50)
GFR: 65.12 mL/min (ref >60.00)
Glucose, Bld: 100 mg/dL — ABNORMAL HIGH (ref 70–99)
Potassium: 4.7 mEq/L (ref 3.5–5.1)
Sodium: 139 mEq/L (ref 135–145)
Total Bilirubin: 0.5 mg/dL (ref 0.2–1.2)
Total Protein: 7.3 g/dL (ref 6.0–8.3)

## 2018-10-14 LAB — LIPID PANEL
Cholesterol: 200 mg/dL (ref 0–200)
HDL: 37.6 mg/dL — ABNORMAL LOW (ref >39.00)
LDL Cholesterol: 125 mg/dL — ABNORMAL HIGH (ref 0–99)
NonHDL: 162.87
Total CHOL/HDL Ratio: 5
Triglycerides: 190 mg/dL — ABNORMAL HIGH (ref 0.0–149.0)
VLDL: 38 mg/dL (ref 0.0–40.0)

## 2018-10-14 LAB — MAGNESIUM: Magnesium: 1.9 mg/dL (ref 1.5–2.5)

## 2018-10-15 NOTE — Telephone Encounter (Signed)
Sent to pharmacy. Patient needs follow-up to receive future refills. Thanks.

## 2018-10-15 NOTE — Telephone Encounter (Signed)
Lm asking patient to return call to schedule f/u for refills.

## 2018-11-25 ENCOUNTER — Other Ambulatory Visit: Payer: Self-pay | Admitting: Internal Medicine

## 2018-11-25 DIAGNOSIS — F411 Generalized anxiety disorder: Secondary | ICD-10-CM

## 2018-11-25 MED ORDER — ALPRAZOLAM 0.5 MG PO TABS: 0.5000 mg | ORAL_TABLET | Freq: Three times a day (TID) | ORAL | 0 refills | Status: DC | PRN

## 2019-01-02 ENCOUNTER — Other Ambulatory Visit: Payer: Self-pay | Admitting: Family Medicine

## 2019-01-06 ENCOUNTER — Other Ambulatory Visit: Payer: Self-pay | Admitting: Internal Medicine

## 2019-01-06 DIAGNOSIS — F411 Generalized anxiety disorder: Secondary | ICD-10-CM

## 2019-01-07 MED ORDER — ALPRAZOLAM 0.5 MG PO TABS: 0.5000 mg | ORAL_TABLET | Freq: Three times a day (TID) | ORAL | 0 refills | Status: DC | PRN

## 2019-01-07 NOTE — Telephone Encounter (Signed)
Refilled: 11/25/2018 Last OV: 06/15/2018 Next OV: not scheduled

## 2019-01-07 NOTE — Telephone Encounter (Signed)
Pt is scheduled for follow up on 01/18/2019.

## 2019-01-18 ENCOUNTER — Other Ambulatory Visit: Payer: Self-pay

## 2019-01-18 ENCOUNTER — Ambulatory Visit: Payer: 59 | Admitting: Family Medicine

## 2019-01-27 ENCOUNTER — Other Ambulatory Visit: Payer: Self-pay

## 2019-01-27 ENCOUNTER — Ambulatory Visit (INDEPENDENT_AMBULATORY_CARE_PROVIDER_SITE_OTHER): Payer: 59 | Admitting: Family Medicine

## 2019-01-27 VITALS — Ht 71.0 in | Wt 176.0 lb

## 2019-01-27 DIAGNOSIS — I10 Essential (primary) hypertension: Secondary | ICD-10-CM

## 2019-01-27 DIAGNOSIS — Z125 Encounter for screening for malignant neoplasm of prostate: Secondary | ICD-10-CM

## 2019-01-27 DIAGNOSIS — F411 Generalized anxiety disorder: Secondary | ICD-10-CM | POA: Diagnosis not present

## 2019-01-27 DIAGNOSIS — Z7189 Other specified counseling: Secondary | ICD-10-CM

## 2019-01-27 DIAGNOSIS — E291 Testicular hypofunction: Secondary | ICD-10-CM | POA: Diagnosis not present

## 2019-01-27 MED ORDER — ALPRAZOLAM 0.5 MG PO TABS: 0.5000 mg | ORAL_TABLET | Freq: Three times a day (TID) | ORAL | 0 refills | Status: DC | PRN

## 2019-01-27 NOTE — Progress Notes (Signed)
Virtual Visit via video note  This visit type was conducted due to national recommendations for restrictions regarding the COVID-19 pandemic (e.g. social distancing).  This format is felt to be most appropriate for this patient at this time.  All issues noted in this document were discussed and addressed.  No physical exam was performed (except for noted visual exam findings with Video Visits).   I connected with Gregory Stokes today at  1:45 PM EST by a video enabled telemedicine application and verified that I am speaking with the correct person using two identifiers. Location patient: home Location provider: work Persons participating in the virtual visit: patient, provider  I discussed the limitations, risks, security and privacy concerns of performing an evaluation and management service by telephone and the availability of in person appointments. I also discussed with the patient that there may be a patient responsible charge related to this service. The patient expressed understanding and agreed to proceed.   Reason for visit: Follow-up.  HPI: Anxiety: Patient notes this is pretty well controlled currently.  He takes his Xanax 3 times a day.  He notes it helps him sleep.  He notes if he starts to get worked up about something he will take the Xanax during the day.  Denies depression.  He does not want to change the dose as this has been quite beneficial for years.  He has tried a couple of SSRIs in the past that did not help.  He is concerned about having to follow-up every 3 months to receive his Xanax prescription.  He notes depending on the cost of his visits he may not be able to follow-up that frequently and may have to make a decision about coming off the Xanax versus spending the money to come in for his office visits.  Hypogonadism: He stopped his testosterone injections for a while though last week he started back up with them.  He notes urology wanted him to have some lab work  including a PSA and total testosterone.  Hypertension: He is not checking blood pressures.  He is taking lisinopril.  No chest pain or shortness of breath.  COVID-19 discussion: Patient has not had any symptoms.  He expresses his viewpoints on COVID-19 and whether or not it is as significant as it is made out to be.  He notes he has not known anybody with COVID-19.  He wonders why we have not seen any flu this year.  He also wonders about hospital systems getting reimbursed at a higher rate for Covid patients.  He also commented on the current mask guidelines and how it went from N95 masks being the only safe option to now wearing any kind of mask is appropriate.  He also comments on how quickly the vaccine came out and wondering if this is safe.   ROS: See pertinent positives and negatives per HPI.  Past Medical History:  Diagnosis Date  . Anxiety   . Bronchitis   . GERD (gastroesophageal reflux disease)   . Hypertension   . Insomnia   . Low testosterone     Past Surgical History:  Procedure Laterality Date  . NASAL SINUS SURGERY     Dr. Carlis Abbott  . Unremarkable      Family History  Problem Relation Age of Onset  . Lung cancer Father        deceased    SOCIAL HX: Former smoker   Current Outpatient Medications:  .  albuterol (PROVENTIL HFA;VENTOLIN HFA) 108 (90  Base) MCG/ACT inhaler, Inhale 1-2 puffs into the lungs every 6 (six) hours as needed for wheezing or shortness of breath., Disp: 1 Inhaler, Rfl: 1 .  [START ON 02/06/2019] ALPRAZolam (XANAX) 0.5 MG tablet, Take 1 tablet (0.5 mg total) by mouth 3 (three) times daily as needed for anxiety., Disp: 90 tablet, Rfl: 0 .  aspirin 81 MG tablet, Take 81 mg by mouth daily., Disp: , Rfl:  .  gabapentin (NEURONTIN) 400 MG capsule, Take 2 capsules (800 mg total) by mouth 3 (three) times daily., Disp: 540 capsule, Rfl: 1 .  lisinopril (ZESTRIL) 20 MG tablet, Take 1 tablet (20 mg total) by mouth daily. Please call office to schedule a  follow-up., Disp: 90 tablet, Rfl: 0 .  magnesium oxide (MAG-OX) 400 MG tablet, Take 400 mg by mouth daily., Disp: , Rfl:  .  Multiple Vitamin (MULTIVITAMIN) tablet, Take 1 tablet by mouth daily., Disp: , Rfl:  .  omeprazole (PRILOSEC) 40 MG capsule, TAKE ONE CAPSULE BY MOUTH EVERY DAY, Disp: 150 capsule, Rfl: 1 .  sildenafil (VIAGRA) 100 MG tablet, Take 50 mg by mouth daily as needed., Disp: , Rfl:  .  testosterone cypionate (DEPOTESTOSTERONE CYPIONATE) 200 MG/ML injection, INJECT 0.8 ML IN THE MUSCLE EVERY 2 WEEKS, Disp: , Rfl:  .  testosterone cypionate (DEPOTESTOTERONE CYPIONATE) 100 MG/ML injection, Inject 100 mg into the muscle every 14 (fourteen) days. For IM use only, Disp: , Rfl:  .  tiZANidine (ZANAFLEX) 4 MG capsule, Take 1 capsule (4 mg total) by mouth 2 (two) times daily as needed for muscle spasms. Please call to schedule follow-up, Disp: 60 capsule, Rfl: 0 .  triamcinolone cream (KENALOG) 0.1 %, Apply 1 application topically 2 (two) times daily., Disp: 30 g, Rfl: 0  EXAM:  VITALS per patient if applicable:  GENERAL: alert, oriented, appears well and in no acute distress  HEENT: atraumatic, conjunttiva clear, no obvious abnormalities on inspection of external nose and ears  NECK: normal movements of the head and neck  LUNGS: on inspection no signs of respiratory distress, breathing rate appears normal, no obvious gross SOB, gasping or wheezing  CV: no obvious cyanosis  MS: moves all visible extremities without noticeable abnormality  PSYCH/NEURO: pleasant and cooperative, no obvious depression or anxiety, speech and thought processing grossly intact  ASSESSMENT AND PLAN:  Discussed the following assessment and plan:  Generalized anxiety disorder Seems to be well controlled.  He will continue Xanax at this time.  Discussed the options of trying a different SSRI.  I discussed my rationale for having him in for a visit every 3 months.  Discussed given that the Xanax is a  controlled substance I have people come in every 3 months for monitoring.  He noted he would have to make a decision at some point regarding the cost of the visits versus him continuing on Xanax.  Hypogonadism male Labs ordered and to be completed in about 3 months.  We will forward them to his urologist when they return.  Hypertension He will have his wife check his blood pressure.  He will get [place] his readings.  He will continue lisinopril.  Educated about COVID-19 I discussed with the patient the COVID-19 is a very real virus and can cause very significant medical issues and death.  Discussed that there are many people in our community that have been affected by this.  I advised that the technology used to create the vaccines has been in the works for other viruses for quite  some time so they were not necessarily created overnight.  Discussed that we are not testing for flu this flu season and that is why we are likely not seeing flu cases.  I advised that I do not have anything to do with billing or reimbursement so I do not have the ability to comment on the possibility of getting reimbursed a higher rate for Covid patients.  Discussed that wearing any kind of mask is safer than no mask though with the order of effectiveness appears to be N95's, then surgical masks, then other masks.   Orders Placed This Encounter  Procedures  . Basic Metabolic Panel (BMET)    Standing Status:   Future    Standing Expiration Date:   01/27/2020  . CBC    Standing Status:   Future    Standing Expiration Date:   01/27/2020  . Testosterone    Standing Status:   Future    Standing Expiration Date:   01/27/2020  . PSA, Medicare ( [facility] only)    Standing Status:   Future    Standing Expiration Date:   01/27/2020    Meds ordered this encounter  Medications  . ALPRAZolam (XANAX) 0.5 MG tablet    Sig: Take 1 tablet (0.5 mg total) by mouth 3 (three) times daily as needed for anxiety.    Dispense:  90  tablet    Refill:  0     I discussed the assessment and treatment plan with the patient. The patient was provided an opportunity to ask questions and all were answered. The patient agreed with the plan and demonstrated an understanding of the instructions.   The patient was advised to call back or seek an in-person evaluation if the symptoms worsen or if the condition fails to improve as anticipated.   [name], MD

## 2019-01-28 DIAGNOSIS — Z7189 Other specified counseling: Secondary | ICD-10-CM | POA: Insufficient documentation

## 2019-01-28 NOTE — Assessment & Plan Note (Signed)
He will have his wife check his blood pressure.  He will get [place] his readings.  He will continue lisinopril.

## 2019-01-28 NOTE — Assessment & Plan Note (Signed)
I discussed with the patient the COVID-19 is a very real virus and can cause very significant medical issues and death.  Discussed that there are many people in our community that have been affected by this.  I advised that the technology used to create the vaccines has been in the works for other viruses for quite some time so they were not necessarily created overnight.  Discussed that we are not testing for flu this flu season and that is why we are likely not seeing flu cases.  I advised that I do not have anything to do with billing or reimbursement so I do not have the ability to comment on the possibility of getting reimbursed a higher rate for Covid patients.  Discussed that wearing any kind of mask is safer than no mask though with the order of effectiveness appears to be N95's, then surgical masks, then other masks.

## 2019-01-28 NOTE — Assessment & Plan Note (Addendum)
Labs ordered and to be completed in about 3 months.  We will forward them to his urologist when they return.

## 2019-01-28 NOTE — Assessment & Plan Note (Signed)
Seems to be well controlled.  He will continue Xanax at this time.  Discussed the options of trying a different SSRI.  I discussed my rationale for having him in for a visit every 3 months.  Discussed given that the Xanax is a controlled substance I have people come in every 3 months for monitoring.  He noted he would have to make a decision at some point regarding the cost of the visits versus him continuing on Xanax.

## 2019-02-01 ENCOUNTER — Other Ambulatory Visit: Payer: Self-pay | Admitting: Family Medicine

## 2019-02-01 DIAGNOSIS — M5441 Lumbago with sciatica, right side: Secondary | ICD-10-CM

## 2019-02-01 DIAGNOSIS — R252 Cramp and spasm: Secondary | ICD-10-CM

## 2019-02-01 DIAGNOSIS — G8929 Other chronic pain: Secondary | ICD-10-CM

## 2019-02-02 ENCOUNTER — Ambulatory Visit: Payer: Self-pay

## 2019-02-04 ENCOUNTER — Telehealth: Payer: Self-pay

## 2019-02-07 NOTE — Telephone Encounter (Signed)
I am a little confused on this. It looks like the tizanidine capsule is listed on their formulary. Is that correct?

## 2019-02-08 MED ORDER — TIZANIDINE HCL 4 MG PO TABS: 4.0000 mg | ORAL_TABLET | Freq: Two times a day (BID) | ORAL | 0 refills | Status: DC | PRN

## 2019-02-08 NOTE — Telephone Encounter (Signed)
Yes that is correct.  I think because its not HCL maybe that's why. Ella Guillotte,cma

## 2019-02-08 NOTE — Telephone Encounter (Signed)
Noted.  Tizanidine tablets sent to pharmacy.

## 2019-02-10 DIAGNOSIS — Z8619 Personal history of other infectious and parasitic diseases: Secondary | ICD-10-CM

## 2019-02-11 ENCOUNTER — Other Ambulatory Visit: Payer: Self-pay

## 2019-02-11 ENCOUNTER — Ambulatory Visit: Payer: Self-pay | Admitting: Physician Assistant

## 2019-02-11 ENCOUNTER — Encounter: Payer: Self-pay | Admitting: Physician Assistant

## 2019-02-11 DIAGNOSIS — Z113 Encounter for screening for infections with a predominantly sexual mode of transmission: Secondary | ICD-10-CM

## 2019-02-11 LAB — GRAM STAIN

## 2019-02-11 NOTE — Progress Notes (Signed)
Declines Twinrix #3 today.  Gram stain reviewed. No tx per SO Aileen Fass, RN

## 2019-02-11 NOTE — Progress Notes (Signed)
[facility] Department STI clinic/screening visit  Subjective:  Gregory EINSTEIN Sr. is a 54 y.o. male being seen today for an STI screening visit. The patient reports they do have symptoms.    Patient has the following medical conditions:   Patient Active Problem List   Diagnosis Date Noted  . Educated about COVID-19 01/28/2019  . TMJ dysfunction 08/13/2017  . Cough 02/20/2017  . Right wrist pain 02/03/2017  . Weight loss 06/14/2016  . Hypersomnia 04/22/2016  . Rash and nonspecific skin eruption 04/15/2016  . Leg cramps 04/15/2016  . Musculoskeletal chest pain 11/02/2015  . History of herpes genitalis 12/14/2014  . Skin lesion 04/14/2013  . Chronic low back pain 04/14/2013  . Leukocytosis 08/04/2012  . Generalized anxiety disorder 08/04/2012  . Hypogonadism male 10/24/2011  . Hypertension 09/24/2011  . GERD (gastroesophageal reflux disease) 09/24/2011  . Erectile dysfunction 09/24/2011     Chief Complaint  Patient presents with  . SEXUALLY TRANSMITTED DISEASE    Screening    HPI  Patient reports that he has had pain in the testicles 1-2 times a week in the evening.  Denies associated symptoms and would like a screening to be sure that he does not have an infection.  Reports that he has a PCP and sees them q 4 months.   See flowsheet for further details and programmatic requirements.    The following portions of the patient's history were reviewed and updated as appropriate: allergies, current medications, past medical history, past social history, past surgical history and problem list.  Objective:  There were no vitals filed for this visit.  Physical Exam Constitutional:      General: He is not in acute distress.    Appearance: Normal appearance.  HENT:     Head: Normocephalic and atraumatic.     Comments: No nits, lice, or hair loss. No cervical, supraclavicular or axillary adenopathy.    Mouth/Throat:     Mouth: Mucous membranes are moist.    Pharynx: Oropharynx is clear. No oropharyngeal exudate or posterior oropharyngeal erythema.  Eyes:     Conjunctiva/sclera: Conjunctivae normal.  Pulmonary:     Effort: Pulmonary effort is normal.  Abdominal:     Palpations: Abdomen is soft. There is no mass.     Tenderness: There is no abdominal tenderness. There is no guarding or rebound.  Genitourinary:    Penis: Normal.      Testes: Normal.     Comments: External genitalia/pubic area without nits, lice, edema, erythema, lesions and inguinal adenopathy. Penis circumcised and without lesions, rash or discharge at meatus. Musculoskeletal:     Cervical back: Neck supple. No tenderness.  Skin:    General: Skin is warm and dry.     Findings: No bruising, erythema, lesion or rash.  Neurological:     Mental Status: He is alert and oriented to person, place, and time.  Psychiatric:        Mood and Affect: Mood normal.        Behavior: Behavior normal.        Thought Content: Thought content normal.        Judgment: Judgment normal.       Assessment and Plan:  Gregory Drown Sr. is a 54 y.o. male presenting to the [facility] Department for STI screening  1. Screening for STD (sexually transmitted disease) Patient into clinic for screening today. Rec condoms with all sex. Await test results.  Counseled that RN will call  if needs to RTC for treatment once results are back. Enc patient to discuss symptoms with PCP since they may decide to do further testing/evaluation. - Gram stain - HIV [facility] - Syphilis Serology, [facility] - Gonococcus culture     No follow-ups on file.  No future appointments.  Jerene Dilling, PA

## 2019-02-15 LAB — GONOCOCCUS CULTURE

## 2019-03-02 ENCOUNTER — Other Ambulatory Visit: Payer: Self-pay | Admitting: Family Medicine

## 2019-03-02 DIAGNOSIS — F411 Generalized anxiety disorder: Secondary | ICD-10-CM

## 2019-03-02 NOTE — Telephone Encounter (Signed)
Refill request for xanax, last seen 02-11-19, last filled 02-06-19.  Please advise.

## 2019-03-13 ENCOUNTER — Other Ambulatory Visit: Payer: Self-pay | Admitting: Family Medicine

## 2019-03-23 ENCOUNTER — Other Ambulatory Visit: Payer: Self-pay | Admitting: Family Medicine

## 2019-03-23 DIAGNOSIS — F411 Generalized anxiety disorder: Secondary | ICD-10-CM

## 2019-04-04 ENCOUNTER — Other Ambulatory Visit: Payer: Self-pay | Admitting: Family Medicine

## 2019-05-05 ENCOUNTER — Other Ambulatory Visit: Payer: Self-pay | Admitting: Family Medicine

## 2019-05-05 DIAGNOSIS — F411 Generalized anxiety disorder: Secondary | ICD-10-CM

## 2019-05-05 NOTE — Telephone Encounter (Signed)
Refill request for Xanax, last seen 01-27-19, last filled 03-30-19.  Please advise.

## 2019-05-06 NOTE — Telephone Encounter (Signed)
LMTCB and schedule appt.

## 2019-05-06 NOTE — Telephone Encounter (Signed)
I will refill once, though he needs a follow-up visit prior to his next refill. Please call him and get him scheduled.

## 2019-05-26 ENCOUNTER — Telehealth: Payer: Self-pay

## 2019-05-26 ENCOUNTER — Other Ambulatory Visit: Payer: Self-pay | Admitting: Family Medicine

## 2019-05-26 DIAGNOSIS — F411 Generalized anxiety disorder: Secondary | ICD-10-CM

## 2019-05-26 NOTE — Telephone Encounter (Signed)
Refill request for xanax, last seen 01-28-19, last filled 05-06-19.  Please advise.

## 2019-05-26 NOTE — Telephone Encounter (Signed)
Mychart sent to inform patient.

## 2019-05-26 NOTE — Telephone Encounter (Signed)
Left message for patient to return call back. Patient needs appoitment scheduled with PCP in the next week or two. Per PCP. So pt can receive his xanax.

## 2019-05-26 NOTE — Telephone Encounter (Signed)
Left message for patient to return call back.  

## 2019-05-26 NOTE — Telephone Encounter (Signed)
He is overdue for a follow-up. Please call him and get him scheduled. He should still have medication to cover for the next week or so.

## 2019-05-27 NOTE — Telephone Encounter (Signed)
He has an appointment tomorrow. He should have enough to cover until then. Refill will be completed at the time of his visit.

## 2019-05-27 NOTE — Telephone Encounter (Signed)
Patient has appointment [id number]

## 2019-05-28 ENCOUNTER — Encounter: Payer: Self-pay | Admitting: Family Medicine

## 2019-05-28 ENCOUNTER — Telehealth (INDEPENDENT_AMBULATORY_CARE_PROVIDER_SITE_OTHER): Payer: Medicare Other | Admitting: Family Medicine

## 2019-05-28 ENCOUNTER — Other Ambulatory Visit: Payer: Self-pay

## 2019-05-28 DIAGNOSIS — I10 Essential (primary) hypertension: Secondary | ICD-10-CM

## 2019-05-28 DIAGNOSIS — E663 Overweight: Secondary | ICD-10-CM | POA: Diagnosis not present

## 2019-05-28 DIAGNOSIS — F411 Generalized anxiety disorder: Secondary | ICD-10-CM | POA: Diagnosis not present

## 2019-05-28 MED ORDER — ALPRAZOLAM 0.5 MG PO TABS: ORAL_TABLET | ORAL | 0 refills | Status: DC

## 2019-05-28 NOTE — Assessment & Plan Note (Signed)
Encouraged continued healthy diet and activity. 

## 2019-05-28 NOTE — Progress Notes (Signed)
I called and spoke with the patietn and scheduled a lab appointment.  Gregory Stokes,cma

## 2019-05-28 NOTE — Assessment & Plan Note (Signed)
Well-controlled.  He needs labs.  We will have the CMA contact him to get him scheduled.  He will continue lisinopril.

## 2019-05-28 NOTE — Progress Notes (Signed)
Virtual Visit via video Note  This visit type was conducted due to national recommendations for restrictions regarding the COVID-19 pandemic (e.g. social distancing).  This format is felt to be most appropriate for this patient at this time.  All issues noted in this document were discussed and addressed.  No physical exam was performed (except for noted visual exam findings with Video Visits).   I connected with Gregory Stokes today at  4:00 PM EDT by a video enabled telemedicine application and verified that I am speaking with the correct person using two identifiers. Location patient: home Location provider: work Persons participating in the virtual visit: patient, provider  I discussed the limitations, risks, security and privacy concerns of performing an evaluation and management service by telephone and the availability of in person appointments. I also discussed with the patient that there may be a patient responsible charge related to this service. The patient expressed understanding and agreed to proceed.  Reason for visit: Follow-up.  HPI: Anxiety: Patient notes this is controllable.  He takes Xanax 3 times a day.  It does not make him drowsy.  No alcohol intake.  No depression.  Hypertension: Typically 120s over 70s.  Taking lisinopril.  No chest pain, shortness of breath, or edema.  Overweight: Patient had lost a fair amount of weight previously without trying though he has gained some of it back.  He has been walking for exercise.  Eats a healthy diet though does occasionally splurge on what he eats every 8 to 10 days.   ROS: See pertinent positives and negatives per HPI.  Past Medical History:  Diagnosis Date  . Anxiety   . Bronchitis   . GERD (gastroesophageal reflux disease)   . Hypertension   . Insomnia   . Low testosterone     Past Surgical History:  Procedure Laterality Date  . NASAL SINUS SURGERY     Dr. Carlis Abbott  . Unremarkable      Family History  Problem  Relation Age of Onset  . Lung cancer Father        deceased    SOCIAL HX: former smoker   Current Outpatient Medications:  .  albuterol (PROVENTIL HFA;VENTOLIN HFA) 108 (90 Base) MCG/ACT inhaler, Inhale 1-2 puffs into the lungs every 6 (six) hours as needed for wheezing or shortness of breath., Disp: 1 Inhaler, Rfl: 1 .  ALPRAZolam (XANAX) 0.5 MG tablet, TAKE 1 TABLET(0.5 MG) BY MOUTH THREE TIMES DAILY AS NEEDED FOR ANXIETY, Disp: 90 tablet, Rfl: 0 .  aspirin 81 MG tablet, Take 81 mg by mouth daily., Disp: , Rfl:  .  gabapentin (NEURONTIN) 400 MG capsule, Take 2 capsules (800 mg total) by mouth 3 (three) times daily., Disp: 540 capsule, Rfl: 1 .  lisinopril (ZESTRIL) 20 MG tablet, TAKE 1 TABLET(20 MG) BY MOUTH DAILY, Disp: 90 tablet, Rfl: 0 .  magnesium oxide (MAG-OX) 400 MG tablet, Take 400 mg by mouth daily., Disp: , Rfl:  .  Multiple Vitamin (MULTIVITAMIN) tablet, Take 1 tablet by mouth daily., Disp: , Rfl:  .  omeprazole (PRILOSEC) 40 MG capsule, TAKE ONE CAPSULE BY MOUTH EVERY DAY, Disp: 150 capsule, Rfl: 1 .  sildenafil (VIAGRA) 100 MG tablet, Take 50 mg by mouth daily as needed., Disp: , Rfl:  .  testosterone cypionate (DEPOTESTOSTERONE CYPIONATE) 200 MG/ML injection, INJECT 0.8 ML IN THE MUSCLE EVERY 2 WEEKS, Disp: , Rfl:  .  testosterone cypionate (DEPOTESTOTERONE CYPIONATE) 100 MG/ML injection, Inject 100 mg into the muscle every  14 (fourteen) days. For IM use only, Disp: , Rfl:  .  tiZANidine (ZANAFLEX) 4 MG tablet, TAKE 1 TABLET(4 MG) BY MOUTH TWICE DAILY AS NEEDED FOR MUSCLE SPASMS, Disp: 60 tablet, Rfl: 0 .  triamcinolone cream (KENALOG) 0.1 %, Apply 1 application topically 2 (two) times daily., Disp: 30 g, Rfl: 0  EXAM:  VITALS per patient if applicable:  GENERAL: alert, oriented, appears well and in no acute distress  HEENT: atraumatic, conjunttiva clear, no obvious abnormalities on inspection of external nose and ears  NECK: normal movements of the head and  neck  LUNGS: on inspection no signs of respiratory distress, breathing rate appears normal, no obvious gross SOB, gasping or wheezing  CV: no obvious cyanosis  MS: moves all visible extremities without noticeable abnormality  PSYCH/NEURO: pleasant and cooperative, no obvious depression or anxiety, speech and thought processing grossly intact  ASSESSMENT AND PLAN:  Discussed the following assessment and plan:  Hypertension Well-controlled.  He needs labs.  We will have the CMA contact him to get him scheduled.  He will continue lisinopril.  Overweight Encouraged continued healthy diet and activity.  Generalized anxiety disorder Stable.  Continue Xanax as needed.  Refill given.  He will return in 3 to 4 months.  Controlled substance database reviewed.   Health maintenance: Patient is due for colonoscopy.  Discussed this with him.  He wants to check with his wife and his insurance regarding his out-of-pocket cost.  He will let us know when he he was ready to have this completed.  No orders of the defined types were placed in this encounter.   Meds ordered this encounter  Medications  . ALPRAZolam (XANAX) 0.5 MG tablet    Sig: TAKE 1 TABLET(0.5 MG) BY MOUTH THREE TIMES DAILY AS NEEDED FOR ANXIETY    Dispense:  90 tablet    Refill:  0     I discussed the assessment and treatment plan with the patient. The patient was provided an opportunity to ask questions and all were answered. The patient agreed with the plan and demonstrated an understanding of the instructions.   The patient was advised to call back or seek an in-person evaluation if the symptoms worsen or if the condition fails to improve as anticipated.    [name], MD

## 2019-05-28 NOTE — Assessment & Plan Note (Signed)
Stable.  Continue Xanax as needed.  Refill given.  He will return in 3 to 4 months.  Controlled substance database reviewed.

## 2019-05-29 ENCOUNTER — Other Ambulatory Visit: Payer: Self-pay | Admitting: Family Medicine

## 2019-05-29 DIAGNOSIS — G8929 Other chronic pain: Secondary | ICD-10-CM

## 2019-06-01 ENCOUNTER — Other Ambulatory Visit: Payer: Self-pay

## 2019-06-01 ENCOUNTER — Other Ambulatory Visit (INDEPENDENT_AMBULATORY_CARE_PROVIDER_SITE_OTHER): Payer: Medicare Other

## 2019-06-01 DIAGNOSIS — E291 Testicular hypofunction: Secondary | ICD-10-CM

## 2019-06-01 DIAGNOSIS — Z125 Encounter for screening for malignant neoplasm of prostate: Secondary | ICD-10-CM | POA: Diagnosis not present

## 2019-06-01 DIAGNOSIS — I10 Essential (primary) hypertension: Secondary | ICD-10-CM | POA: Diagnosis not present

## 2019-06-01 LAB — CBC
HCT: 47.3 % (ref 39.0–52.0)
Hemoglobin: 16.3 g/dL (ref 13.0–17.0)
MCHC: 34.5 g/dL (ref 30.0–36.0)
MCV: 89.7 fl (ref 78.0–100.0)
Platelets: 300 10*3/uL (ref 150.0–400.0)
RBC: 5.28 Mil/uL (ref 4.22–5.81)
RDW: 13.3 % (ref 11.5–15.5)
WBC: 9.4 10*3/uL (ref 4.0–10.5)

## 2019-06-01 LAB — PSA, MEDICARE: PSA: 0.81 ng/ml (ref 0.10–4.00)

## 2019-06-01 LAB — BASIC METABOLIC PANEL
BUN: 11 mg/dL (ref 6–23)
CO2: 28 mEq/L (ref 19–32)
Calcium: 9.8 mg/dL (ref 8.4–10.5)
Chloride: 103 mEq/L (ref 96–112)
Creatinine, Ser: 1.15 mg/dL (ref 0.40–1.50)
GFR: 66.27 mL/min (ref >60.00)
Glucose, Bld: 114 mg/dL — ABNORMAL HIGH (ref 70–99)
Potassium: 4.5 mEq/L (ref 3.5–5.1)
Sodium: 138 mEq/L (ref 135–145)

## 2019-06-01 LAB — TESTOSTERONE: Testosterone: 435.98 ng/dL (ref 300.00–890.00)

## 2019-06-03 ENCOUNTER — Telehealth: Payer: Self-pay | Admitting: Family Medicine

## 2019-06-03 NOTE — Telephone Encounter (Signed)
LVM to set up 76m follow up for anxiety

## 2019-06-20 ENCOUNTER — Other Ambulatory Visit: Payer: Self-pay | Admitting: Family Medicine

## 2019-06-27 ENCOUNTER — Other Ambulatory Visit: Payer: Self-pay | Admitting: Family Medicine

## 2019-06-29 ENCOUNTER — Other Ambulatory Visit: Payer: Self-pay | Admitting: Family Medicine

## 2019-06-29 DIAGNOSIS — F411 Generalized anxiety disorder: Secondary | ICD-10-CM

## 2019-06-29 NOTE — Telephone Encounter (Signed)
Patient requesting refill of Xanax. Last seen on 01/27/19. Last filled on 05/28/19

## 2019-07-02 ENCOUNTER — Other Ambulatory Visit: Payer: Self-pay | Admitting: Family Medicine

## 2019-07-26 ENCOUNTER — Other Ambulatory Visit: Payer: Self-pay | Admitting: Family Medicine

## 2019-07-27 ENCOUNTER — Other Ambulatory Visit: Payer: Self-pay | Admitting: Family Medicine

## 2019-07-27 DIAGNOSIS — F411 Generalized anxiety disorder: Secondary | ICD-10-CM

## 2019-07-28 NOTE — Telephone Encounter (Signed)
Refill sent to pharmacy. Patient needs follow-up in one month for further refills of this medication. Please contact him to schedule follow-up. Thanks.

## 2019-08-04 NOTE — Telephone Encounter (Signed)
I called the patient and LVM for the patient to call and schedule a appointment in 1 month.  Gregory Stokes,cma

## 2019-08-23 ENCOUNTER — Other Ambulatory Visit: Payer: Self-pay | Admitting: Family Medicine

## 2019-08-26 ENCOUNTER — Other Ambulatory Visit: Payer: Self-pay | Admitting: Family Medicine

## 2019-08-26 DIAGNOSIS — F411 Generalized anxiety disorder: Secondary | ICD-10-CM

## 2019-08-26 NOTE — Telephone Encounter (Signed)
Left message for patient to return call back. Patient needs appointment to receive xanax refill.

## 2019-08-26 NOTE — Telephone Encounter (Signed)
Patient is due for follow-up. Please get him scheduled for some time in the next month and then I will consider refilling.

## 2019-09-06 ENCOUNTER — Other Ambulatory Visit: Payer: Self-pay | Admitting: Family Medicine

## 2019-09-29 ENCOUNTER — Other Ambulatory Visit: Payer: Self-pay | Admitting: Family Medicine

## 2019-09-29 DIAGNOSIS — F411 Generalized anxiety disorder: Secondary | ICD-10-CM

## 2019-09-29 NOTE — Telephone Encounter (Signed)
This patient needs an appointment for this to be refilled.  Please call him and get him scheduled for follow-up.

## 2019-10-01 MED ORDER — ALPRAZOLAM 0.5 MG PO TABS: ORAL_TABLET | ORAL | 0 refills | Status: DC

## 2019-10-07 ENCOUNTER — Other Ambulatory Visit: Payer: Self-pay | Admitting: Family Medicine

## 2019-10-26 ENCOUNTER — Other Ambulatory Visit: Payer: Self-pay | Admitting: Family Medicine

## 2019-10-26 DIAGNOSIS — F411 Generalized anxiety disorder: Secondary | ICD-10-CM

## 2019-11-12 ENCOUNTER — Telehealth (INDEPENDENT_AMBULATORY_CARE_PROVIDER_SITE_OTHER): Payer: Medicare Other | Admitting: Family Medicine

## 2019-11-12 ENCOUNTER — Encounter: Payer: Self-pay | Admitting: Family Medicine

## 2019-11-12 ENCOUNTER — Other Ambulatory Visit: Payer: Self-pay

## 2019-11-12 DIAGNOSIS — F411 Generalized anxiety disorder: Secondary | ICD-10-CM | POA: Diagnosis not present

## 2019-11-12 DIAGNOSIS — I10 Essential (primary) hypertension: Secondary | ICD-10-CM | POA: Diagnosis not present

## 2019-11-12 NOTE — Assessment & Plan Note (Signed)
Adequately controlled.  We will get him scheduled for labs.  He will continue lisinopril 20 mg once daily.

## 2019-11-12 NOTE — Assessment & Plan Note (Signed)
Stable.  Discussed the option of adding an SSRI to help with his symptoms though he declines that.  He can continue Xanax 0.5 mg 3 times daily as needed for anxiety.  Follow-up in 3 months.

## 2019-11-12 NOTE — Progress Notes (Signed)
° °Virtual Visit via video Note ° °This visit type was conducted due to national recommendations for restrictions regarding the COVID-19 pandemic (e.g. social distancing).  This format is felt to be most appropriate for this patient at this time.  All issues noted in this document were discussed and addressed.  No physical exam was performed (except for noted visual exam findings with Video Visits).  ° °I connected with Gregory Stokes today at  2:15 PM EDT by a video enabled telemedicine application and verified that I am speaking with the correct person using two identifiers. °Location patient: home °Location provider: work °Persons participating in the virtual visit: patient, provider ° °I discussed the limitations, risks, security and privacy concerns of performing an evaluation and management service by telephone and the availability of in person appointments. I also discussed with the patient that there may be a patient responsible charge related to this service. The patient expressed understanding and agreed to proceed. ° °Reason for visit: f/u ° °HPI: °HYPERTENSION  °Disease Monitoring  °Home BP Monitoring 120s/80 or less Chest pain- no    Dyspnea- no °Medications  °Compliance-  Taking lisinopril.  Edema- no ° °Anxiety: Patient notes with the way the world is currently he does have a lot that bothers him and produces some anxiety though he tries to think through it and he will chill out and pray and that typically helps with his symptoms.  Occasional panic attack type issues though he is able to work through those.  He does take Xanax 3 times daily.  No drowsiness with this.  No alcohol intake with this.  No depression.  He feels as though overall he is stable and in a good place with his anxiety. ° ° ° ° °ROS: See pertinent positives and negatives per HPI. ° °Past Medical History:  °Diagnosis Date  °• Anxiety   °• Bronchitis   °• GERD (gastroesophageal reflux disease)   °• Hypertension   °• Insomnia   °• Low  testosterone   ° ° °Past Surgical History:  °Procedure Laterality Date  °• NASAL SINUS SURGERY    ° Dr. Clark  °• Unremarkable    ° ° °Family History  °Problem Relation Age of Onset  °• Lung cancer Father   °     deceased  ° ° °SOCIAL HX: Former smoker ° ° °Current Outpatient Medications:  °•  albuterol (PROVENTIL HFA;VENTOLIN HFA) 108 (90 Base) MCG/ACT inhaler, Inhale 1-2 puffs into the lungs every 6 (six) hours as needed for wheezing or shortness of breath., Disp: 1 Inhaler, Rfl: 1 °•  ALPRAZolam (XANAX) 0.5 MG tablet, TAKE 1 TABLET(0.5 MG) BY MOUTH THREE TIMES DAILY AS NEEDED FOR ANXIETY, Disp: 90 tablet, Rfl: 0 °•  aspirin 81 MG tablet, Take 81 mg by mouth daily., Disp: , Rfl:  °•  gabapentin (NEURONTIN) 400 MG capsule, TAKE 2 CAPSULES(800 MG) BY MOUTH THREE TIMES DAILY, Disp: 540 capsule, Rfl: 1 °•  lisinopril (ZESTRIL) 20 MG tablet, TAKE 1 TABLET(20 MG) BY MOUTH DAILY, Disp: 90 tablet, Rfl: 0 °•  magnesium oxide (MAG-OX) 400 MG tablet, Take 400 mg by mouth daily., Disp: , Rfl:  °•  Multiple Vitamin (MULTIVITAMIN) tablet, Take 1 tablet by mouth daily., Disp: , Rfl:  °•  omeprazole (PRILOSEC) 40 MG capsule, TAKE ONE CAPSULE BY MOUTH DAILY, Disp: 150 capsule, Rfl: 1 °•  sildenafil (VIAGRA) 100 MG tablet, Take 50 mg by mouth daily as needed., Disp: , Rfl:  °•  testosterone cypionate (DEPOTESTOSTERONE CYPIONATE) 200   200 MG/ML injection, INJECT 0.8 ML IN THE MUSCLE EVERY 2 WEEKS, Disp: , Rfl:    testosterone cypionate (DEPOTESTOTERONE CYPIONATE) 100 MG/ML injection, Inject 100 mg into the muscle every 14 (fourteen) days. For IM use only, Disp: , Rfl:    tiZANidine (ZANAFLEX) 4 MG tablet, TAKE 1 TABLET(4 MG) BY MOUTH TWICE DAILY AS NEEDED FOR MUSCLE SPASMS, Disp: 60 tablet, Rfl: 0   triamcinolone cream (KENALOG) 0.1 %, Apply 1 application topically 2 (two) times daily., Disp: 30 g, Rfl: 0  EXAM:  VITALS per patient if applicable:  GENERAL: alert, oriented, appears well and in no acute distress  HEENT:  atraumatic, conjunttiva clear, no obvious abnormalities on inspection of external nose and ears  NECK: normal movements of the head and neck  LUNGS: on inspection no signs of respiratory distress, breathing rate appears normal, no obvious gross SOB, gasping or wheezing  CV: no obvious cyanosis  MS: moves all visible extremities without noticeable abnormality  PSYCH/NEURO: pleasant and cooperative, no obvious depression or anxiety, speech and thought processing grossly intact  ASSESSMENT AND PLAN:  Discussed the following assessment and plan:  Problem List Items Addressed This Visit    Generalized anxiety disorder    Stable.  Discussed the option of adding an SSRI to help with his symptoms though he declines that.  He can continue Xanax 0.5 mg 3 times daily as needed for anxiety.  Follow-up in 3 months.      Hypertension    Adequately controlled.  We will get him scheduled for labs.  He will continue lisinopril 20 mg once daily.      Relevant Orders   Comp Met (CMET)   Lipid panel       I discussed the assessment and treatment plan with the patient. The patient was provided an opportunity to ask questions and all were answered. The patient agreed with the plan and demonstrated an understanding of the instructions.   The patient was advised to call back or seek an in-person evaluation if the symptoms worsen or if the condition fails to improve as anticipated.    [name], MD

## 2019-11-17 ENCOUNTER — Telehealth: Payer: Self-pay

## 2019-11-17 NOTE — Telephone Encounter (Signed)
lvm to return  (around 11/19/2019) for fasting labs, 3 months PCP

## 2019-11-22 ENCOUNTER — Other Ambulatory Visit: Payer: Self-pay | Admitting: Family Medicine

## 2019-11-22 DIAGNOSIS — G8929 Other chronic pain: Secondary | ICD-10-CM

## 2019-11-22 DIAGNOSIS — M5441 Lumbago with sciatica, right side: Secondary | ICD-10-CM

## 2019-11-25 ENCOUNTER — Other Ambulatory Visit (INDEPENDENT_AMBULATORY_CARE_PROVIDER_SITE_OTHER): Payer: Medicare Other

## 2019-11-25 ENCOUNTER — Other Ambulatory Visit: Payer: Self-pay

## 2019-11-25 ENCOUNTER — Telehealth: Payer: Self-pay

## 2019-11-25 DIAGNOSIS — I10 Essential (primary) hypertension: Secondary | ICD-10-CM

## 2019-11-25 DIAGNOSIS — E291 Testicular hypofunction: Secondary | ICD-10-CM

## 2019-11-25 LAB — COMPREHENSIVE METABOLIC PANEL
ALT: 15 U/L (ref 0–53)
AST: 18 U/L (ref 0–37)
Albumin: 4.3 g/dL (ref 3.5–5.2)
Alkaline Phosphatase: 67 U/L (ref 39–117)
BUN: 16 mg/dL (ref 6–23)
CO2: 30 mEq/L (ref 19–32)
Calcium: 9.6 mg/dL (ref 8.4–10.5)
Chloride: 103 mEq/L (ref 96–112)
Creatinine, Ser: 1.16 mg/dL (ref 0.40–1.50)
GFR: 71.41 mL/min (ref >60.00)
Glucose, Bld: 95 mg/dL (ref 70–99)
Potassium: 4.4 mEq/L (ref 3.5–5.1)
Sodium: 138 mEq/L (ref 135–145)
Total Bilirubin: 0.6 mg/dL (ref 0.2–1.2)
Total Protein: 6.8 g/dL (ref 6.0–8.3)

## 2019-11-25 LAB — LIPID PANEL
Cholesterol: 184 mg/dL (ref 0–200)
HDL: 36.6 mg/dL — ABNORMAL LOW (ref >39.00)
LDL Cholesterol: 121 mg/dL — ABNORMAL HIGH (ref 0–99)
NonHDL: 147.71
Total CHOL/HDL Ratio: 5
Triglycerides: 134 mg/dL (ref 0.0–149.0)
VLDL: 26.8 mg/dL (ref 0.0–40.0)

## 2019-11-25 LAB — TESTOSTERONE: Testosterone: 1279.73 ng/dL — ABNORMAL HIGH (ref 300.00–890.00)

## 2019-11-25 NOTE — Addendum Note (Signed)
Addended by: Leone Haven on: 11/25/2019 10:33 AM   Modules accepted: Orders

## 2019-11-25 NOTE — Telephone Encounter (Signed)
I placed an order for the testosterone.

## 2019-11-25 NOTE — Telephone Encounter (Signed)
Thank you :)

## 2019-11-25 NOTE — Addendum Note (Signed)
Addended by: Tor Netters I on: 11/25/2019 10:38 AM   Modules accepted: Orders

## 2019-11-25 NOTE — Telephone Encounter (Signed)
Patient had labs drawn today and was wondering if a testosterone lab could be ordered so that he can have his T-cells checked. He was told by [facility] that he could have that lab drawn. His provider there is Dr. Mar Daring. Please advise

## 2019-11-28 ENCOUNTER — Other Ambulatory Visit: Payer: Self-pay | Admitting: Family Medicine

## 2019-11-28 DIAGNOSIS — F411 Generalized anxiety disorder: Secondary | ICD-10-CM

## 2019-12-13 ENCOUNTER — Encounter: Payer: Self-pay | Admitting: Family Medicine

## 2019-12-22 ENCOUNTER — Ambulatory Visit (INDEPENDENT_AMBULATORY_CARE_PROVIDER_SITE_OTHER): Payer: Medicare Other

## 2019-12-22 VITALS — Ht 71.0 in | Wt 176.0 lb

## 2019-12-22 DIAGNOSIS — Z0000 Encounter for general adult medical examination without abnormal findings: Secondary | ICD-10-CM

## 2019-12-22 NOTE — Progress Notes (Signed)
Subjective:   Gregory Drown Sr. is a 54 y.o. male who presents for an Initial Medicare Annual Wellness Visit.  Review of Systems    No ROS.  Medicare Wellness Virtual Visit.    Cardiac Risk Factors include: advanced age (>70men, >9 women);hypertension     Objective:    Today's Vitals   12/22/19 1421  Weight: 176 lb (79.8 kg)  Height: 5\' 11"  (1.803 m)   Body mass index is 24.55 kg/m.  Advanced Directives 12/22/2019 08/12/2015  Does Patient Have a Medical Advance Directive? No No  Would patient like information on creating a medical advance directive? No - Patient declined No - patient declined information    Current Medications (verified) Outpatient Encounter Medications as of 12/22/2019  Medication Sig  . albuterol (PROVENTIL HFA;VENTOLIN HFA) 108 (90 Base) MCG/ACT inhaler Inhale 1-2 puffs into the lungs every 6 (six) hours as needed for wheezing or shortness of breath.  . ALPRAZolam (XANAX) 0.5 MG tablet TAKE 1 TABLET(0.5 MG) BY MOUTH THREE TIMES DAILY AS NEEDED FOR ANXIETY  . aspirin 81 MG tablet Take 81 mg by mouth daily.  Marland Kitchen gabapentin (NEURONTIN) 400 MG capsule TAKE 2 CAPSULES(800 MG) BY MOUTH THREE TIMES DAILY  . lisinopril (ZESTRIL) 20 MG tablet TAKE 1 TABLET(20 MG) BY MOUTH DAILY  . magnesium oxide (MAG-OX) 400 MG tablet Take 400 mg by mouth daily.  . Multiple Vitamin (MULTIVITAMIN) tablet Take 1 tablet by mouth daily.  Marland Kitchen omeprazole (PRILOSEC) 40 MG capsule TAKE ONE CAPSULE BY MOUTH DAILY  . sildenafil (VIAGRA) 100 MG tablet Take 50 mg by mouth daily as needed.  . testosterone cypionate (DEPOTESTOSTERONE CYPIONATE) 200 MG/ML injection INJECT 0.8 ML IN THE MUSCLE EVERY 2 WEEKS  . testosterone cypionate (DEPOTESTOTERONE CYPIONATE) 100 MG/ML injection Inject 100 mg into the muscle every 14 (fourteen) days. For IM use only  . tiZANidine (ZANAFLEX) 4 MG tablet TAKE 1 TABLET(4 MG) BY MOUTH TWICE DAILY AS NEEDED FOR MUSCLE SPASMS  . triamcinolone cream (KENALOG) 0.1 % Apply  1 application topically 2 (two) times daily.   No facility-administered encounter medications on file as of 12/22/2019.    Allergies (verified) Tessalon perles [benzonatate]   History: Past Medical History:  Diagnosis Date  . Anxiety   . Bronchitis   . GERD (gastroesophageal reflux disease)   . Hypertension   . Insomnia   . Low testosterone    Past Surgical History:  Procedure Laterality Date  . NASAL SINUS SURGERY     Dr. Carlis Abbott  . Unremarkable     Family History  Problem Relation Age of Onset  . Lung cancer Father        deceased   Social History   Socioeconomic History  . Marital status: Married    Spouse name: Not on file  . Number of children: Not on file  . Years of education: Not on file  . Highest education level: Not on file  Occupational History  . Not on file  Tobacco Use  . Smoking status: Former Smoker    Types: Cigarettes  . Smokeless tobacco: Never Used  . Tobacco comment: smoked age [age] stopped then age 10 x 1 years max 1/2 ppd   Substance and Sexual Activity  . Alcohol use: No    Alcohol/week: 0.0 standard drinks  . Drug use: No  . Sexual activity: Yes  Other Topics Concern  . Not on file  Social History Narrative   Lives in [place] with wife  Work - Chief of Staff   Social Determinants of Radio broadcast assistant Strain: Low Risk   . Difficulty of Paying Living Expenses: Not hard at all  Food Insecurity: No Food Insecurity  . Worried About Charity fundraiser in the Last Year: Never true  . Ran Out of Food in the Last Year: Never true  Transportation Needs: No Transportation Needs  . Lack of Transportation (Medical): No  . Lack of Transportation (Non-Medical): No  Physical Activity: Insufficiently Active  . Days of Exercise per Week: 3 days  . Minutes of Exercise per Session: 30 min  Stress: No Stress Concern Present  . Feeling of Stress : Not at all  Social Connections: Unknown  . Frequency of Communication with Friends  and Family: Not on file  . Frequency of Social Gatherings with Friends and Family: Not on file  . Attends Religious Services: Not on file  . Active Member of Clubs or Organizations: Not on file  . Attends Archivist Meetings: Not on file  . Marital Status: Married    Tobacco Counseling Counseling given: Not Answered Comment: smoked age [age] stopped then age 54 x 1 years max 1/2 ppd    Clinical Intake:  Pre-visit preparation completed: Yes        Diabetes: No  How often do you need to have someone help you when you read instructions, pamphlets, or other written materials from your doctor or pharmacy?: 1 - Never   Interpreter Needed?: No      Activities of Daily Living In your present state of health, do you have any difficulty performing the following activities: 12/22/2019  Hearing? N  Vision? N  Difficulty concentrating or making decisions? N  Walking or climbing stairs? N  Dressing or bathing? N  Doing errands, shopping? N  Preparing Food and eating ? N  Using the Toilet? N  In the past six months, have you accidently leaked urine? N  Do you have problems with loss of bowel control? N  Managing your Medications? N  Managing your Finances? N  Housekeeping or managing your Housekeeping? N  Some recent data might be hidden    Patient Care Team: Leone Haven, MD as PCP - General (Family Medicine)  Indicate any recent Medical Services you may have received from other than [facility] providers in the past year (date may be approximate).     Assessment:   This is a routine wellness examination for [place].  I connected with Tysen today by telephone and verified that I am speaking with the correct person using two identifiers. Location patient: home Location provider: work Persons participating in the virtual visit: patient, Marine scientist.    I discussed the limitations, risks, security and privacy concerns of performing an evaluation and management service  by telephone and the availability of in person appointments. I also discussed with the patient that there may be a patient responsible charge related to this service. The patient expressed understanding and verbally consented to this telephonic visit.    Interactive audio and video telecommunications were attempted between this provider and patient, however failed, due to patient having technical difficulties OR patient did not have access to video capability.  We continued and completed visit with audio only.  Some vital signs may be absent or patient reported.   Hearing/Vision screen  Hearing Screening   125Hz  250Hz  500Hz  1000Hz  2000Hz  3000Hz  4000Hz  6000Hz  8000Hz   Right ear:  Left ear:           Comments: Patient is able to hear conversational tones without difficulty.  No issues reported.  Vision Screening Comments: Wears corrective lenses Visual acuity not assessed, virtual visit.       Dietary issues and exercise activities discussed: Current Exercise Habits: The patient does not participate in regular exercise at present, Intensity: Mild  Healthy diet Good water intake  Goals      Patient Stated   .  Increase physical activity (pt-stated)      Start exercising at the gym Stay hydrated Lower cholesterol      Depression Screen PHQ 2/9 Scores 12/22/2019 11/12/2019 05/28/2019 01/27/2019 02/03/2017 07/03/2015 03/03/2015  PHQ - 2 Score 0 0 0 0 0 0 0    Fall Risk Fall Risk  12/22/2019 11/12/2019 05/28/2019 01/27/2019 07/03/2015  Falls in the past year? 0 0 0 0 No  Number falls in past yr: 0 0 0 0 -  Injury with Fall? 0 - - - -  Follow up Falls evaluation completed Falls evaluation completed Falls evaluation completed Falls evaluation completed -    FALL RISK PREVENTION PERTAINING TO THE HOME: Handrails in use when climbing stairs? Yes Home free of loose throw rugs in walkways, pet beds, electrical cords, etc? Yes  Adequate lighting in your home to reduce risk of falls?  Yes   ASSISTIVE DEVICES UTILIZED TO PREVENT FALLS: Use of a cane, walker or w/c? No   TIMED UP AND GO: Was the test performed? No . Virtual visit.    Cognitive Function:  Patient is alert and oriented x3.  Denies difficulty focusing, making decisions, memory loss.  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  MMSE/6CIT deferred. Normal by direct observation/communication.      Immunizations Immunization History  Administered Date(s) Administered  . Influenza,inj,Quad PF,6+ Mos 01/30/2015  . Tdap 09/23/2009    TDAP status: Due, Education has been provided regarding the importance of this vaccine. Advised may receive this vaccine at local pharmacy or Health Dept. Aware to provide a copy of the vaccination record if obtained from local pharmacy or Health Dept. Verbalized acceptance and understanding. Deferred.   Covid vaccine- deferred per patient preference.  Health Maintenance Health Maintenance  Topic Date Due  . Hepatitis C Screening  Never done  . COVID-19 Vaccine (1) 01/07/2020 (Originally 05/28/1977)  . INFLUENZA VACCINE  04/06/2020 (Originally 08/08/2019)  . COLONOSCOPY  12/21/2020 (Originally 05/29/2015)  . TETANUS/TDAP  12/21/2020 (Originally 09/24/2019)  . HIV Screening  Completed   Colonoscopy- deferred per patient preference.   Lung Cancer Screening: (Low Dose CT Chest recommended if Age 72-80 years, 30 pack-year currently smoking OR have quit w/in 15years.) does not qualify.   Hepatitis C Screening: Consent given. Future order okay to place when PCP orders labs.   Vision Screening: Recommended annual ophthalmology exams for early detection of glaucoma and other disorders of the eye. Is the patient up to date with their annual eye exam?  Yes   Dental Screening: Recommended annual dental exams for proper oral hygiene.  Community Resource Referral / Chronic Care Management: CRR required this visit?  No   CCM required this visit?  No      Plan:   Keep all routine  maintenance appointments.   I have personally reviewed and noted the following in the patient's chart:   . Medical and social history . Use of alcohol, tobacco or illicit drugs  . Current medications and supplements . Functional ability and  status . Nutritional status . Physical activity . Advanced directives . List of other physicians . Hospitalizations, surgeries, and ER visits in previous 12 months . Vitals . Screenings to include cognitive, depression, and falls . Referrals and appointments  In addition, I have reviewed and discussed with patient certain preventive protocols, quality metrics, and best practice recommendations. A written personalized care plan for preventive services as well as general preventive health recommendations were provided to patient via mychart.     Varney Biles, LPN   [id number]    Nurse Notes: Patient reports he contacted Urology and all issues addressed. Encouraged to follow up with PCP as needed.

## 2019-12-22 NOTE — Patient Instructions (Addendum)
Gregory Stokes , Thank you for taking time to come for your Medicare Wellness Visit. I appreciate your ongoing commitment to your health goals. Please review the following plan we discussed and let me know if I can assist you in the future.   These are the goals we discussed: Goals      Patient Stated   .  Increase physical activity (pt-stated)      Start exercising at the gym Stay hydrated Lower cholesterol       This is a list of the screening recommended for you and due dates:  Health Maintenance  Topic Date Due  .  Hepatitis C: One time screening is recommended by Center for Disease Control  (CDC) for  adults born from [birth year] through 1965.   Never done  . COVID-19 Vaccine (1) 01/07/2020*  . Flu Shot  04/06/2020*  . Colon Cancer Screening  12/21/2020*  . Tetanus Vaccine  12/21/2020*  . HIV Screening  Completed  *Topic was postponed. The date shown is not the original due date.    Immunizations Immunization History  Administered Date(s) Administered  . Influenza,inj,Quad PF,6+ Mos 01/30/2015  . Tdap 09/23/2009   Advanced directives: Not yet completed.  Conditions/risks identified: None new.   Follow up in one year for your annual wellness visit.  Preventive Care 40-64 Years, Male Preventive care refers to lifestyle choices and visits with your health care provider that can promote health and wellness. What does preventive care include?  A yearly physical exam. This is also called an annual well check.  Dental exams once or twice a year.  Routine eye exams. Ask your health care provider how often you should have your eyes checked.  Personal lifestyle choices, including:  Daily care of your teeth and gums.  Regular physical activity.  Eating a healthy diet.  Avoiding tobacco and drug use.  Limiting alcohol use.  Practicing safe sex.  Taking low-dose aspirin every day starting at age 39. What happens during an annual well check? The services and screenings  done by your health care provider during your annual well check will depend on your age, overall health, lifestyle risk factors, and family history of disease. Counseling  Your health care provider may ask you questions about your:  Alcohol use.  Tobacco use.  Drug use.  Emotional well-being.  Home and relationship well-being.  Sexual activity.  Eating habits.  Work and work Statistician. Screening  You may have the following tests or measurements:  Height, weight, and BMI.  Blood pressure.  Lipid and cholesterol levels. These may be checked every 5 years, or more frequently if you are over 27 years old.  Skin check.  Lung cancer screening. You may have this screening every year starting at age 76 if you have a 30-pack-year history of smoking and currently smoke or have quit within the past 15 years.  Fecal occult blood test (FOBT) of the stool. You may have this test every year starting at age 24.  Flexible sigmoidoscopy or colonoscopy. You may have a sigmoidoscopy every 5 years or a colonoscopy every 10 years starting at age 63.  Prostate cancer screening. Recommendations will vary depending on your family history and other risks.  Hepatitis C blood test.  Hepatitis B blood test.  Sexually transmitted disease (STD) testing.  Diabetes screening. This is done by checking your blood sugar (glucose) after you have not eaten for a while (fasting). You may have this done every 1-3 years. Discuss your test results,  treatment options, and if necessary, the need for more tests with your health care provider. Vaccines  Your health care provider may recommend certain vaccines, such as:  Influenza vaccine. This is recommended every year.  Tetanus, diphtheria, and acellular pertussis (Tdap, Td) vaccine. You may need a Td booster every 10 years.  Zoster vaccine. You may need this after age 42.  Pneumococcal 13-valent conjugate (PCV13) vaccine. You may need this if you have  certain conditions and have not been vaccinated.  Pneumococcal polysaccharide (PPSV23) vaccine. You may need one or two doses if you smoke cigarettes or if you have certain conditions. Talk to your health care provider about which screenings and vaccines you need and how often you need them. This information is not intended to replace advice given to you by your health care provider. Make sure you discuss any questions you have with your health care provider. Document Released: 01/20/2015 Document Revised: 09/13/2015 Document Reviewed: 10/25/2014 Elsevier Interactive Patient Education  2017 [place] Prevention in the Home Falls can cause injuries. They can happen to people of all ages. There are many things you can do to make your home safe and to help prevent falls. What can I do on the outside of my home?  Regularly fix the edges of walkways and driveways and fix any cracks.  Remove anything that might make you trip as you walk through a door, such as a raised step or threshold.  Trim any bushes or trees on the path to your home.  Use bright outdoor lighting.  Clear any walking paths of anything that might make someone trip, such as rocks or tools.  Regularly check to see if handrails are loose or broken. Make sure that both sides of any steps have handrails.  Any raised decks and porches should have guardrails on the edges.  Have any leaves, snow, or ice cleared regularly.  Use sand or salt on walking paths during winter.  Clean up any spills in your garage right away. This includes oil or grease spills. What can I do in the bathroom?  Use night lights.  Install grab bars by the toilet and in the tub and shower. Do not use towel bars as grab bars.  Use non-skid mats or decals in the tub or shower.  If you need to sit down in the shower, use a plastic, non-slip stool.  Keep the floor dry. Clean up any water that spills on the floor as soon as it  happens.  Remove soap buildup in the tub or shower regularly.  Attach bath mats securely with double-sided non-slip rug tape.  Do not have throw rugs and other things on the floor that can make you trip. What can I do in the bedroom?  Use night lights.  Make sure that you have a light by your bed that is easy to reach.  Do not use any sheets or blankets that are too big for your bed. They should not hang down onto the floor.  Have a firm chair that has side arms. You can use this for support while you get dressed.  Do not have throw rugs and other things on the floor that can make you trip. What can I do in the kitchen?  Clean up any spills right away.  Avoid walking on wet floors.  Keep items that you use a lot in easy-to-reach places.  If you need to reach something above you, use a strong step stool that has  a grab bar.  Keep electrical cords out of the way.  Do not use floor polish or wax that makes floors slippery. If you must use wax, use non-skid floor wax.  Do not have throw rugs and other things on the floor that can make you trip. What can I do with my stairs?  Do not leave any items on the stairs.  Make sure that there are handrails on both sides of the stairs and use them. Fix handrails that are broken or loose. Make sure that handrails are as long as the stairways.  Check any carpeting to make sure that it is firmly attached to the stairs. Fix any carpet that is loose or worn.  Avoid having throw rugs at the top or bottom of the stairs. If you do have throw rugs, attach them to the floor with carpet tape.  Make sure that you have a light switch at the top of the stairs and the bottom of the stairs. If you do not have them, ask someone to add them for you. What else can I do to help prevent falls?  Wear shoes that:  Do not have high heels.  Have rubber bottoms.  Are comfortable and fit you well.  Are closed at the toe. Do not wear sandals.  If you  use a stepladder:  Make sure that it is fully opened. Do not climb a closed stepladder.  Make sure that both sides of the stepladder are locked into place.  Ask someone to hold it for you, if possible.  Clearly mark and make sure that you can see:  Any grab bars or handrails.  First and last steps.  Where the edge of each step is.  Use tools that help you move around (mobility aids) if they are needed. These include:  Canes.  Walkers.  Scooters.  Crutches.  Turn on the lights when you go into a dark area. Replace any light bulbs as soon as they burn out.  Set up your furniture so you have a clear path. Avoid moving your furniture around.  If any of your floors are uneven, fix them.  If there are any pets around you, be aware of where they are.  Review your medicines with your doctor. Some medicines can make you feel dizzy. This can increase your chance of falling. Ask your doctor what other things that you can do to help prevent falls. This information is not intended to replace advice given to you by your health care provider. Make sure you discuss any questions you have with your health care provider. Document Released: 10/20/2008 Document Revised: 06/01/2015 Document Reviewed: 01/28/2014 Elsevier Interactive Patient Education  2017 [employer].

## 2019-12-29 ENCOUNTER — Other Ambulatory Visit: Payer: Self-pay | Admitting: Family Medicine

## 2019-12-29 DIAGNOSIS — F411 Generalized anxiety disorder: Secondary | ICD-10-CM

## 2019-12-29 NOTE — Telephone Encounter (Signed)
Last Refilled: 11/29/2019 Last OV: 11/12/2019 Next OV: not scheduled

## 2020-02-18 ENCOUNTER — Other Ambulatory Visit: Payer: Self-pay | Admitting: Family Medicine

## 2020-02-24 ENCOUNTER — Other Ambulatory Visit: Payer: Self-pay | Admitting: Family Medicine

## 2020-02-24 DIAGNOSIS — F411 Generalized anxiety disorder: Secondary | ICD-10-CM

## 2020-02-24 NOTE — Telephone Encounter (Signed)
I sent a refill to the pharmacy of the patient needs to be scheduled for follow-up prior to his next refill.  Please call and get him scheduled.  Thanks.

## 2020-03-24 ENCOUNTER — Other Ambulatory Visit: Payer: Self-pay | Admitting: Family Medicine

## 2020-03-24 DIAGNOSIS — F411 Generalized anxiety disorder: Secondary | ICD-10-CM

## 2020-03-24 NOTE — Telephone Encounter (Signed)
Mychart sent to inform patient of PCP statement.

## 2020-03-24 NOTE — Telephone Encounter (Signed)
The patient is due for follow-up.  Please call him and get him scheduled.  Once he is scheduled I will fill enough to cover until his visit.

## 2020-03-24 NOTE — Telephone Encounter (Signed)
He needs follow-up for future refills of this.  Please get him scheduled.

## 2020-03-27 MED ORDER — ALPRAZOLAM 0.5 MG PO TABS
ORAL_TABLET | ORAL | 0 refills | Status: DC
Start: 2020-03-27 — End: 2020-04-19

## 2020-04-11 ENCOUNTER — Other Ambulatory Visit: Payer: Self-pay | Admitting: Family Medicine

## 2020-04-14 ENCOUNTER — Other Ambulatory Visit: Payer: Self-pay

## 2020-04-19 ENCOUNTER — Ambulatory Visit (INDEPENDENT_AMBULATORY_CARE_PROVIDER_SITE_OTHER): Payer: Medicare Other

## 2020-04-19 ENCOUNTER — Other Ambulatory Visit: Payer: Self-pay

## 2020-04-19 ENCOUNTER — Ambulatory Visit (INDEPENDENT_AMBULATORY_CARE_PROVIDER_SITE_OTHER): Payer: Medicare Other | Admitting: Family Medicine

## 2020-04-19 ENCOUNTER — Encounter: Payer: Self-pay | Admitting: Family Medicine

## 2020-04-19 DIAGNOSIS — I10 Essential (primary) hypertension: Secondary | ICD-10-CM

## 2020-04-19 DIAGNOSIS — S46219A Strain of muscle, fascia and tendon of other parts of biceps, unspecified arm, initial encounter: Secondary | ICD-10-CM | POA: Diagnosis not present

## 2020-04-19 DIAGNOSIS — F411 Generalized anxiety disorder: Secondary | ICD-10-CM

## 2020-04-19 DIAGNOSIS — M25562 Pain in left knee: Secondary | ICD-10-CM | POA: Diagnosis not present

## 2020-04-19 DIAGNOSIS — M25511 Pain in right shoulder: Secondary | ICD-10-CM | POA: Insufficient documentation

## 2020-04-19 DIAGNOSIS — M2550 Pain in unspecified joint: Secondary | ICD-10-CM | POA: Insufficient documentation

## 2020-04-19 LAB — BASIC METABOLIC PANEL
BUN: 16 mg/dL (ref 6–23)
CO2: 28 mEq/L (ref 19–32)
Calcium: 9.8 mg/dL (ref 8.4–10.5)
Chloride: 102 mEq/L (ref 96–112)
Creatinine, Ser: 1.28 mg/dL (ref 0.40–1.50)
GFR: 63.28 mL/min (ref >60.00)
Glucose, Bld: 76 mg/dL (ref 70–99)
Potassium: 4.5 mEq/L (ref 3.5–5.1)
Sodium: 137 mEq/L (ref 135–145)

## 2020-04-19 MED ORDER — ALPRAZOLAM 0.5 MG PO TABS: ORAL_TABLET | ORAL | 0 refills | Status: DC

## 2020-04-19 MED ORDER — LISINOPRIL 20 MG PO TABS: ORAL_TABLET | ORAL | 3 refills | Status: DC

## 2020-04-19 NOTE — Patient Instructions (Signed)
Nice to see you. We will get labs today. We will also get an x-ray today. I placed a referral to orthopedics.

## 2020-04-19 NOTE — Assessment & Plan Note (Signed)
Improved.  He will monitor for recurrence.

## 2020-04-19 NOTE — Progress Notes (Signed)
[name], MD Phone: [phone]  Gregory Drown Sr. is a 55 y.o. male who presents today for f/u.  HYPERTENSION  Disease Monitoring  Home BP Monitoring 130s/70-80 Chest pain- no    Dyspnea- no Medications  Compliance-  Taking lisinopril.   Edema- no  Anxiety: Patient notes this is stable.  The Xanax works well for him.  He reports he has tried many other medications in the past and this is the one that works best.  He has been on this quite some time.  No drowsiness.  No alcohol intake.  No depression.  Left knee pain: This has been going on for about 3 weeks.  It occurred several years ago though improved on its own.  There is some stiffness and popping.  Occasionally gives out on him.  Does have a tightness in his posterior knee.  No specific injury.  Reports somebody in the past told him that eating sardines could help though he has been doing that and it has not made a difference.  The patient is concerned about arthritis as a cause.  He wonders about Enbrel as a treatment based on seeing this on TV.  Right shoulder/right biceps injury: He injured both of these things on 2 different occasions.  For his shoulder he was pulling on a bar and felt something tear.  He notes that issue is clearing up with regards to the pain.  He notes on another occasion he almost dropped something and went to catch it and felt his bicep tear as well.  There is a ball of muscle at the end of his bicep area.  He has no pain in either of these areas at this time.  Social History   Tobacco Use  Smoking Status Former Smoker  . Types: Cigarettes  Smokeless Tobacco Never Used  Tobacco Comment   smoked age [age] stopped then age 67 x 1 years max 1/2 ppd     Current Outpatient Medications on File Prior to Visit  Medication Sig Dispense Refill  . albuterol (PROVENTIL HFA;VENTOLIN HFA) 108 (90 Base) MCG/ACT inhaler Inhale 1-2 puffs into the lungs every 6 (six) hours as needed for wheezing or shortness of  breath. 1 Inhaler 1  . aspirin 81 MG tablet Take 81 mg by mouth daily.    Marland Kitchen gabapentin (NEURONTIN) 400 MG capsule TAKE 2 CAPSULES(800 MG) BY MOUTH THREE TIMES DAILY 540 capsule 1  . magnesium oxide (MAG-OX) 400 MG tablet Take 400 mg by mouth daily.    . Multiple Vitamin (MULTIVITAMIN) tablet Take 1 tablet by mouth daily.    Marland Kitchen omeprazole (PRILOSEC) 40 MG capsule TAKE ONE CAPSULE BY MOUTH DAILY 150 capsule 1  . sildenafil (VIAGRA) 100 MG tablet Take 50 mg by mouth daily as needed.    . testosterone cypionate (DEPOTESTOSTERONE CYPIONATE) 200 MG/ML injection INJECT 0.8 ML IN THE MUSCLE EVERY 2 WEEKS    . testosterone cypionate (DEPOTESTOTERONE CYPIONATE) 100 MG/ML injection Inject 100 mg into the muscle every 14 (fourteen) days. For IM use only    . tiZANidine (ZANAFLEX) 4 MG tablet TAKE 1 TABLET(4 MG) BY MOUTH TWICE DAILY AS NEEDED FOR MUSCLE SPASMS 60 tablet 0  . triamcinolone cream (KENALOG) 0.1 % Apply 1 application topically 2 (two) times daily. 30 g 0   No current facility-administered medications on file prior to visit.     ROS see history of present illness  Objective  Physical Exam Vitals:   04/19/20 1043  BP: 118/70  Pulse: 73  Temp: 98.9 F (37.2 C)  SpO2: 97%    BP Readings from Last 3 Encounters:  04/19/20 118/70  09/07/17 (!) 131/98  08/13/17 118/82   Wt Readings from Last 3 Encounters:  04/19/20 171 lb 6.4 oz (77.7 kg)  12/22/19 176 lb (79.8 kg)  11/12/19 176 lb (79.8 kg)    Physical Exam Constitutional:      General: He is not in acute distress.    Appearance: He is not diaphoretic.  Cardiovascular:     Rate and Rhythm: Normal rate and regular rhythm.     Heart sounds: Normal heart sounds.  Pulmonary:     Effort: Pulmonary effort is normal.     Breath sounds: Normal breath sounds.  Musculoskeletal:     Comments: Left shoulder with no tenderness, left bicep with no tenderness, there is evidence of a biceps tendon tear with balling up of his muscle at the  distal portion of his bicep, 5/5 strength bilateral biceps, left knee with no swelling, warmth, erythema, or tenderness, negative McMurray's left knee, there is grinding noted on exam with extension of his knee on the left  Skin:    General: Skin is warm and dry.  Neurological:     Mental Status: He is alert.      Assessment/Plan: Please see individual problem list.  Problem List Items Addressed This Visit    Hypertension    Generally okay control.  He will continue lisinopril 20 mg once daily.  We will check a BMP.      Relevant Medications   lisinopril (ZESTRIL) 20 MG tablet   Other Relevant Orders   Basic Metabolic Panel (BMET)   Generalized anxiety disorder   Relevant Medications   ALPRAZolam (XANAX) 0.5 MG tablet (Start on 04/26/2020)   Left knee pain    Suspect osteoarthritis.  We will check an x-ray.       Relevant Orders   DG Knee Complete 4 Views Left   Biceps tendon tear    The patient has evidence of a biceps tendon tear.  We will refer to orthopedics.  I did discuss that they may not have any surgical options for him though we will at least see what they have to offer.      Relevant Orders   Ambulatory referral to Orthopedic Surgery   Right shoulder pain    Improved.  He will monitor for recurrence.      Arthralgia    The patient reports scattered chronic issues with joint pains.  Discussed that this is likely osteoarthritis though we will check labs to evaluate for other causes.      Relevant Orders   Antinuclear Antib (ANA)   Rheumatoid Factor   Cyclic citrul peptide antibody, IgG ([facility])      This visit occurred during the SARS-CoV-2 public health emergency.  Safety protocols were in place, including screening questions prior to the visit, additional usage of staff PPE, and extensive cleaning of exam room while observing appropriate contact time as indicated for disinfecting solutions.    [name], MD [place]

## 2020-04-19 NOTE — Assessment & Plan Note (Signed)
The patient has evidence of a biceps tendon tear.  We will refer to orthopedics.  I did discuss that they may not have any surgical options for him though we will at least see what they have to offer.

## 2020-04-19 NOTE — Assessment & Plan Note (Signed)
Suspect osteoarthritis.  We will check an x-ray.

## 2020-04-19 NOTE — Assessment & Plan Note (Signed)
The patient reports scattered chronic issues with joint pains.  Discussed that this is likely osteoarthritis though we will check labs to evaluate for other causes.

## 2020-04-19 NOTE — Assessment & Plan Note (Signed)
Generally okay control.  He will continue lisinopril 20 mg once daily.  We will check a BMP.

## 2020-04-21 LAB — RHEUMATOID FACTOR: Rheumatoid fact SerPl-aCnc: <14 IU/mL (ref <14)

## 2020-04-21 LAB — CYCLIC CITRUL PEPTIDE ANTIBODY, IGG: Cyclic Citrullin Peptide Ab: <16 UNITS

## 2020-04-21 LAB — ANTI-NUCLEAR AB-TITER (ANA TITER): ANA Titer 1: 1:40 {titer} — ABNORMAL HIGH

## 2020-04-21 LAB — ANA: Anti Nuclear Antibody (ANA): POSITIVE — AB

## 2020-04-26 ENCOUNTER — Other Ambulatory Visit: Payer: Self-pay | Admitting: Family Medicine

## 2020-04-27 ENCOUNTER — Telehealth: Payer: Self-pay | Admitting: Family Medicine

## 2020-04-27 DIAGNOSIS — S46211D Strain of muscle, fascia and tendon of other parts of biceps, right arm, subsequent encounter: Secondary | ICD-10-CM

## 2020-04-27 DIAGNOSIS — M25562 Pain in left knee: Secondary | ICD-10-CM

## 2020-04-27 DIAGNOSIS — G8929 Other chronic pain: Secondary | ICD-10-CM

## 2020-04-27 DIAGNOSIS — R768 Other specified abnormal immunological findings in serum: Secondary | ICD-10-CM

## 2020-04-27 DIAGNOSIS — R7689 Other specified abnormal immunological findings in serum: Secondary | ICD-10-CM

## 2020-04-27 NOTE — Telephone Encounter (Addendum)
Pt called and wanted to know what his referral for Ortho was placed for. He said that it should have been for his shoulder and knee. When he went to Ortho they told him that it was only placed for his shoulder and his knee pain was what he wanted to be seen for.  Also he stated that he had an issue with the lab draw and how it was done. He wanted to know if there were new procedure on how labs are being drawn   He also said that he never received a call about labs or xray

## 2020-04-27 NOTE — Telephone Encounter (Signed)
Patient called office and would like to speak to the office manager. "He feels he should not have to wait until the office gets all there ducks in a row", per patient. Patient was advised that Dr. Caryl Bis was not in the office today and the Nurse Supervisor needs to speak to provider first. Patient said, " I need the office manger to call me today, please".

## 2020-04-28 NOTE — Addendum Note (Signed)
Addended by: Caryl Bis, Merica Prell G on: 04/28/2020 12:09 PM   Modules accepted: Orders

## 2020-04-28 NOTE — Telephone Encounter (Signed)
Gregory Stokes advised me that he did not understand his results that he was advised he had severe arthritis that he needed to see rheumatology. I advised patient that there are different types of arthritis and that they are treated by different specialist. Patient Interrupted during the explanation several times, stating he was suppose to have seen ortho for his knee at the previous appointment. At this point I ask patient to let me clarify the providers note and plan of care . That on his visit here in the office he was advised a referral was being placed for his shoulder to an orthopedist and that other testing was to be done to rule out other causes for his knee pain which were an X-ray and labs, that once we have those test next steps would be discussed. Patient advised he was not called with those results prior to his appointment with the orthopedist, I advised those results were not received until 04/24/20 and that a call was placed to him and a message was left asking him to return call to the office. I read his results to him explaining there are different types of arthritis and that the X-ray revealed osteoarthritis in 3 compartments of the knee and that Dr. Caryl Bis wanted to know if he agreed to see ortho for the knee, patient stated he would like to referral but only wants to see a specialist an MD not a PA or NP. I advised patient that I would notify PCP but that we could not promise that other office protocols would not require him to see a PA prior to seeing an MD. I also advised patient his labs and that he needed a referral to rheumatology hat this could be clinically nothing but this would need to be determined by the rheumatologist patient advised the same again he only wants to see and MD. I advised I would notify PCP of his choices and it was reiterated to patient again we could not control the requirements of another office as to seeing a PA prior to seeing the MD.

## 2020-04-28 NOTE — Telephone Encounter (Signed)
Reviewed. Referrals placed.

## 2020-04-28 NOTE — Telephone Encounter (Signed)
Called patient this am to discuss patient concerns. With this being a clinical concern, I had the RN Supervisor with me ont the call. Patient was advised he was on speaker an who was on the call.   I have asked the RN Supervisor to add all clinical notes regarding this patient and the plan of care per Dr. Caryl Bis

## 2020-04-28 NOTE — Telephone Encounter (Signed)
Pt's wife Amy called and states that pt needs a call back asap about lab results. She said that he has been waiting on standby long enough. Please advise

## 2020-05-01 ENCOUNTER — Telehealth: Payer: Self-pay | Admitting: Family Medicine

## 2020-05-01 NOTE — Telephone Encounter (Signed)
Patient called the office and was transferred to me due to patient had questions concerning Lab results.  Gregory Stokes was transferred to my phone I  Identified patient by name and DOB, I ask patient how could I help after identifying my self, he stated I want to speak with the nurse that called me the other day about my results,  I advised I had spoken to him previously about his results and be glad to help him. Patient stated no, Gae Bon stated there was something wrong with my labs, I advised the patient then I had read and explained his labs to him , but would be glad to do so again . I explained that he had an positive ANA which could be something clinically significant or nothing at all that he needed a referral to a Rheumatologist and That that referral had been placed. Patient then stated "why did I get a call from [place], for an Orthopedic referral for my shoulder an Knee?' I explained to patient he had advised on 04/27/20 that he did not want to see a PA , he wanted a new referral to Ortho and wanted to see a different provider due to his experience at another office and he wanted an MD not a PA." . Patient stated he could tell me exactly what was said to him on the previous call and did not want me to advise him of the previous conversation on 04/27/20. I advised patient at that point could he please hold forone minute so I could get my manager , he refused and hung up the phone.  Patient wife called back after verifying the DPR and her Identifying patient I spoke with Gregory Stokes and advised I was putting her on speaker phone for the conversation and that I had my office Manager present.  Patien twife wanted to know the test result from 04/19/20 and was there a referral made for Rheumatology I advised her of results and of referral and she stated I don't think my husband would be upset if he had been called sooner with his labs I then advised a message was left on 04/24/20 to return call to office.

## 2020-05-22 ENCOUNTER — Other Ambulatory Visit: Payer: Self-pay | Admitting: Family Medicine

## 2020-05-22 DIAGNOSIS — F411 Generalized anxiety disorder: Secondary | ICD-10-CM

## 2020-05-22 NOTE — Telephone Encounter (Signed)
RX Refill:xanax Last Seen:04-19-20 Last ordered:04-26-20

## 2020-05-23 ENCOUNTER — Other Ambulatory Visit: Payer: Self-pay | Admitting: Family Medicine

## 2020-05-23 DIAGNOSIS — G8929 Other chronic pain: Secondary | ICD-10-CM

## 2020-05-30 ENCOUNTER — Other Ambulatory Visit: Payer: Self-pay | Admitting: Family Medicine

## 2020-06-02 ENCOUNTER — Telehealth: Payer: Self-pay | Admitting: Family Medicine

## 2020-06-02 NOTE — Telephone Encounter (Signed)
"  Rejection Reason - Patient did not respond" Gregory Stokes said on May 31, 2020 12:32 PM  "Unable to verify insurance. Pt will have wife call to give info" Gregory Stokes said on May 01, 2020 2:57 PM  msg from [facility] orthopedic surgery

## 2020-06-08 NOTE — Progress Notes (Signed)
Office Visit Note  Patient: Gregory KLOPF Sr.             Date of Birth: [birth year]           MRN: [id number]             PCP: Leone Haven, MD Referring: Leone Haven, MD Visit Date: 06/09/2020 Occupation: Former Administrator  Subjective:  New Patient (Initial Visit) (Abnormal labs, dropping items, left knee pain, grinding, popping, snapping, had injection May, 2022 at [facility], helped for about 3 weeks, right upper arm pain, total body cramping and spasm, low back pain, left thumb trigger finger - unable to straighten thumb, patient has not had COVID vaccine)   History of Present Illness: Gregory LINGARD Sr. is a 55 y.o. male here for evaluation of positive ANA and joint pain in multiple sites but worst at left knee. He is also seeing orthopedic surgery in regards to biceps tendon tear without severe functional deficit. He has symptoms mostly started with the right wrist bony enlargement with surgery on this around 2409 that was complicated by hardware instability apparently form poor bone integrity. Due to this and also chronic joint pains particularly low back disease he was treated with chronic pain medication but had increased complications and had to discontinue these entirely. He has subsequently also developed decreased movement of the left thumb. His left knee has also become increasingly painful with weight bearing and had more popping, clicking, and some instability. This was evaluated at [facility] in April with xray demonstrating osteoarthritis and intraarticular steroid shot helped symptoms for about 1 month. Most recently he injured his right arm while applying heavy torque with subsequent pain at the shoulder and then bulging distally of the biceps tendon thought to represent biceps tendon tear. Currently his functional strength is good and without severe pain. Workup for these multiple joint pains also included positive ANA at a low positive titer so is referred for  evaluation of any underlying autoimmune process.  Labs reviewed 04/2020 ANA 1:40 speckled RF neg CCP neg BMP wnl   Activities of Daily Living:  Patient reports morning stiffness for 2 hours.   Patient Denies nocturnal pain.  Difficulty dressing/grooming: Denies Difficulty climbing stairs: Reports Difficulty getting out of chair: Reports Difficulty using hands for taps, buttons, cutlery, and/or writing: Reports  Review of Systems  Constitutional: Negative for fatigue.  HENT: Negative for mouth dryness.   Eyes: Negative for dryness.  Respiratory: Negative for shortness of breath.   Cardiovascular: Negative for swelling in legs/feet.  Gastrointestinal: Negative for constipation.  Endocrine: Negative for excessive thirst.  Genitourinary: Negative for difficulty urinating.  Musculoskeletal: Positive for arthralgias, joint pain and morning stiffness.  Skin: Negative for rash.  Allergic/Immunologic: Negative for susceptible to infections.  Neurological: Negative for numbness.  Hematological: Negative for bruising/bleeding tendency.  Psychiatric/Behavioral: Negative for sleep disturbance.    PMFS History:  Patient Active Problem List   Diagnosis Date Noted  . Positive ANA (antinuclear antibody) 06/09/2020  . Left knee pain 04/19/2020  . Biceps tendon tear 04/19/2020  . Right shoulder pain 04/19/2020  . Arthralgia 04/19/2020  . Overweight 05/28/2019  . Educated about COVID-19 01/28/2019  . TMJ dysfunction 08/13/2017  . Cough 02/20/2017  . Right wrist pain 02/03/2017  . Hypersomnia 04/22/2016  . Rash and nonspecific skin eruption 04/15/2016  . Leg cramps 04/15/2016  . Musculoskeletal chest pain 11/02/2015  . History of herpes genitalis 12/14/2014  . Skin lesion 04/14/2013  .  Chronic low back pain 04/14/2013  . Leukocytosis 08/04/2012  . Generalized anxiety disorder 08/04/2012  . Hypogonadism male 10/24/2011  . Hypertension 09/24/2011  . GERD (gastroesophageal reflux  disease) 09/24/2011  . Erectile dysfunction 09/24/2011    Past Medical History:  Diagnosis Date  . Anxiety   . Bronchitis   . GERD (gastroesophageal reflux disease)   . Hypertension   . Insomnia   . Low testosterone   . Osteoarthritis     Family History  Problem Relation Age of Onset  . Lung cancer Father        deceased  . Parkinson's disease Mother    Past Surgical History:  Procedure Laterality Date  . NASAL SINUS SURGERY     Dr. Carlis Abbott  . Unremarkable    . WRIST SURGERY     Social History   Social History Narrative   Lives in [place] with wife      Work - Chief of Staff   Immunization History  Administered Date(s) Administered  . Influenza,inj,Quad PF,6+ Mos 01/30/2015  . Tdap 09/23/2009     Objective: Vital Signs: BP 117/73 (BP Location: Right Arm, Patient Position: Sitting, Cuff Size: Normal)   Pulse 64   Resp 15   Ht 5\' 11"  (1.803 m)   Wt 171 lb (77.6 kg)   BMI 23.85 kg/m    Physical Exam HENT:     Right Ear: External ear normal.     Left Ear: External ear normal.     Mouth/Throat:     Mouth: Mucous membranes are moist.     Pharynx: Oropharynx is clear.  Eyes:     Conjunctiva/sclera: Conjunctivae normal.  Cardiovascular:     Rate and Rhythm: Normal rate and regular rhythm.  Pulmonary:     Effort: Pulmonary effort is normal.     Breath sounds: Normal breath sounds.  Skin:    General: Skin is warm and dry.     Findings: No rash.  Neurological:     General: No focal deficit present.     Mental Status: He is alert.      Musculoskeletal Exam: Right biceps tendon soft tissue bulge distally, strength is intact Right wrist surgical scar, decreased extension more than flexion ROM, unable to extend right thumb DIP joint but normal passive movement, flexor tendon nodule or overlying contracture on right 3rd and 4th on palm and left at 3rd digit on palm Left knee crepitus and bony enlargement normal range of movement Left ankle dorsiflexion ROM less  than right ankle, no ankle swelling or tenderness Left 2nd and 3rd toes are deviated, callus present under 2nd MTP and 5th MTP  Investigation: No additional findings.  Imaging: No results found.  Recent Labs: Lab Results  Component Value Date   WBC 9.4 06/01/2019   HGB 16.3 06/01/2019   PLT 300.0 06/01/2019   NA 137 04/19/2020   K 4.5 04/19/2020   CL 102 04/19/2020   CO2 28 04/19/2020   GLUCOSE 76 04/19/2020   BUN 16 04/19/2020   CREATININE 1.28 04/19/2020   BILITOT 0.6 11/25/2019   ALKPHOS 67 11/25/2019   AST 18 11/25/2019   ALT 15 11/25/2019   PROT 6.8 11/25/2019   ALBUMIN 4.3 11/25/2019   CALCIUM 9.8 04/19/2020   GFRAA >60 09/07/2017    Speciality Comments: No specialty comments available.  Procedures:  No procedures performed Allergies: Tessalon perles [benzonatate]   Assessment / Plan:     Visit Diagnoses: Positive ANA (antinuclear antibody)  Arthralgia, unspecified joint -  Plan: RNP Antibody, Anti-Smith antibody, Sjogrens syndrome-A extractable nuclear antibody, Sjogrens syndrome-B extractable nuclear antibody, Anti-DNA antibody, double-stranded, C3 and C4, Sedimentation rate  Positive ANA antibodies and low titer with no specific clinical features for systemic lupus or related autoimmune disease seen on exam at this time.  He does have joint pain at multiple sites but pattern of changes appears more consistent with degenerative arthritis or posttraumatic arthritis and inflammatory disease.  We will check specific ENA antibodies including RNP, Smith, SSA, SSB, dsDNA, also complements and sedimentation rate today with lower pretest suspicion for active autoimmune disease.  Acute pain of left knee  Left knee osteoarthritis on review of recent x-ray he describes some episodes of joint instability but grossly intact to physical exam today.  Discussed self management including proximal leg strengthening exercises and use of soft supportive knee brace and would benefit  from ongoing follow-up with orthopedics needing repeat injection or other investigation.  Acute pain of right shoulder  Right shoulder pain and changes do appear consistent possibly tendon rupture of 1 head of bicep as strength and range of motion remains grossly intact.  His previous assessment with [facility] was in favor for no particular further treatment that I can tell he would be interested in further consideration of this problem does not feel this was very adequately addressed.  Will refer to orthopedic surgery for second consideration also for ongoing follow-up of degenerative joint problems.  Orders: Orders Placed This Encounter  Procedures  . RNP Antibody  . Anti-Smith antibody  . Sjogrens syndrome-A extractable nuclear antibody  . Sjogrens syndrome-B extractable nuclear antibody  . Anti-DNA antibody, double-stranded  . C3 and C4  . Sedimentation rate   No orders of the defined types were placed in this encounter.    Follow-Up Instructions: No follow-ups on file.   Collier Salina, MD  Note - This record has been created using [employer].  Chart creation errors have been sought, but may not always  have been located. Such creation errors do not reflect on  the standard of medical care.

## 2020-06-09 ENCOUNTER — Ambulatory Visit (INDEPENDENT_AMBULATORY_CARE_PROVIDER_SITE_OTHER): Payer: Medicare Other | Admitting: Internal Medicine

## 2020-06-09 ENCOUNTER — Other Ambulatory Visit: Payer: Self-pay

## 2020-06-09 ENCOUNTER — Encounter: Payer: Self-pay | Admitting: Internal Medicine

## 2020-06-09 VITALS — BP 117/73 | HR 64 | Resp 15 | Ht 71.0 in | Wt 171.0 lb

## 2020-06-09 DIAGNOSIS — M2550 Pain in unspecified joint: Secondary | ICD-10-CM | POA: Diagnosis not present

## 2020-06-09 DIAGNOSIS — M25511 Pain in right shoulder: Secondary | ICD-10-CM | POA: Diagnosis not present

## 2020-06-09 DIAGNOSIS — R768 Other specified abnormal immunological findings in serum: Secondary | ICD-10-CM | POA: Diagnosis not present

## 2020-06-09 DIAGNOSIS — M25562 Pain in left knee: Secondary | ICD-10-CM

## 2020-06-09 DIAGNOSIS — R7689 Other specified abnormal immunological findings in serum: Secondary | ICD-10-CM

## 2020-06-12 LAB — C3 AND C4
C3 Complement: 144 mg/dL (ref 82–185)
C4 Complement: 32 mg/dL (ref 15–53)

## 2020-06-12 LAB — ANTI-SMITH ANTIBODY: ENA SM Ab Ser-aCnc: <1 AI

## 2020-06-12 LAB — SJOGRENS SYNDROME-A EXTRACTABLE NUCLEAR ANTIBODY: SSA (Ro) (ENA) Antibody, IgG: <1 AI

## 2020-06-12 LAB — RNP ANTIBODY: Ribonucleic Protein(ENA) Antibody, IgG: <1 AI

## 2020-06-12 LAB — ANTI-DNA ANTIBODY, DOUBLE-STRANDED: ds DNA Ab: <1 IU/mL

## 2020-06-12 LAB — SJOGRENS SYNDROME-B EXTRACTABLE NUCLEAR ANTIBODY: SSB (La) (ENA) Antibody, IgG: <1 AI

## 2020-06-12 LAB — SEDIMENTATION RATE: Sed Rate: 2 mm/h (ref 0–20)

## 2020-06-19 NOTE — Progress Notes (Signed)
I spoke with Gregory Stokes his lab results for all specific antibody tests to follow up the positive ANA test are completely negative. Additionally his sedimentation rate does not indicate active inflammatory disease. He reports a history of chronic mild leukocytosis, this has been seen on previous CBC results for a few years. No cytopenias are seen, which are often more common with ANA-related disease activity. I do not recommend any specific immunosuppressing treatment or rheumatology follow up. He has upcoming orthopedic surgery appointment scheduled later this week to take a look at his shoulder injury and if needed can follow up for knee osteoarthritis too.

## 2020-06-21 ENCOUNTER — Encounter: Payer: Self-pay | Admitting: Orthopaedic Surgery

## 2020-06-21 ENCOUNTER — Other Ambulatory Visit: Payer: Self-pay

## 2020-06-21 ENCOUNTER — Ambulatory Visit (INDEPENDENT_AMBULATORY_CARE_PROVIDER_SITE_OTHER): Payer: Medicare Other

## 2020-06-21 ENCOUNTER — Ambulatory Visit (INDEPENDENT_AMBULATORY_CARE_PROVIDER_SITE_OTHER): Payer: Medicare Other | Admitting: Orthopaedic Surgery

## 2020-06-21 DIAGNOSIS — G8929 Other chronic pain: Secondary | ICD-10-CM | POA: Diagnosis not present

## 2020-06-21 DIAGNOSIS — M25511 Pain in right shoulder: Secondary | ICD-10-CM

## 2020-06-21 DIAGNOSIS — M1712 Unilateral primary osteoarthritis, left knee: Secondary | ICD-10-CM

## 2020-06-21 NOTE — Progress Notes (Signed)
Office Visit Note   Patient: Gregory BRAITHWAITE Sr.           Date of Birth: Oct 04, 1965           MRN: [id number] Visit Date: 06/21/2020              Requested by: Collier Salina, MD [address] [place],  [place] [address] PCP: Leone Haven, MD   Assessment & Plan: Visit Diagnoses:  1. Chronic right shoulder pain   2. Primary osteoarthritis of left knee     Plan: In regards to the left knee he does have a fair amount of degenerative joint disease especially in the patellofemoral and lateral compartments with near bone-on-bone joint space narrowing.  He is still getting relief from the recent cortisone injection which he should try to write out.  We gave him a pamphlet on Visco injection to read about.  He will also try Voltaren gel and a compression knee sleeve for activity.  In regards to the right shoulder he does not appear to be very symptomatic and his strength is well-maintained.  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  For now we will have him follow-up as needed but he will let us know if he wants to try Visco injection.  Follow-Up Instructions: No follow-ups on file.   Orders:  Orders Placed This Encounter  Procedures   XR Shoulder Right   No orders of the defined types were placed in this encounter.     Procedures: No procedures performed   Clinical Data: No additional findings.   Subjective: Chief Complaint  Patient presents with   Left Knee - Pain   Right Shoulder - Pain    [name] is a 55 year old gentleman who comes in for evaluation of left knee pain and right shoulder pain.  In regards to the left knee this has been bothering him for several months to years.  He endorses grinding when he steps up into his truck or when he goes up and down stairs.  He had a previous injection about a month ago at [facility] which helped slightly.  He feels that  the injection is still working.  In terms of the right shoulder he had an injury a few months ago in which she was trying to loosen a rusty bolt when he felt something pop in his shoulder.  Week later he noticed a Popeye deformity.  He feels like it is getting better but still has some discomfort.  Shoulder range of motion has been preserved.  Currently not taking anything for pain.   Review of Systems  Constitutional: Negative.   All other systems reviewed and are negative.   Objective: Vital Signs: There were no vitals taken for this visit.  Physical Exam Vitals and nursing note reviewed.  Constitutional:      Appearance: He is well-developed.  HENT:     Head: Normocephalic and atraumatic.  Eyes:     Pupils: Pupils are equal, round, and reactive to light.  Pulmonary:     Effort: Pulmonary effort is normal.  Abdominal:     Palpations: Abdomen is soft.  Musculoskeletal:        General: Normal range of motion.     Cervical back: Neck supple.  Skin:    General: Skin is warm.  Neurological:     Mental Status: He is alert and oriented  to person, place, and time.  Psychiatric:        Behavior: Behavior normal.        Thought Content: Thought content normal.        Judgment: Judgment normal.    Ortho Exam Left knee exam shows no joint effusion.  Moderate patellofemoral crepitus with range of motion.  Collaterals and cruciates are stable.  Slight restriction in range of motion with pain.  Lateral joint line tenderness.  Right shoulder exam shows a Popeye deformity from a proximal long head biceps rupture.  His shoulder range of motion and strength are normal without much pain.  Manual muscle testing of the rotator cuff is normal. Specialty Comments:  No specialty comments available.  Imaging: No results found.   PMFS History: Patient Active Problem List   Diagnosis Date Noted   Positive ANA (antinuclear antibody) 06/09/2020   Left knee pain 04/19/2020   Biceps tendon tear  04/19/2020   Right shoulder pain 04/19/2020   Arthralgia 04/19/2020   Overweight 05/28/2019   Educated about COVID-19 01/28/2019   TMJ dysfunction 08/13/2017   Cough 02/20/2017   Right wrist pain 02/03/2017   Hypersomnia 04/22/2016   Rash and nonspecific skin eruption 04/15/2016   Leg cramps 04/15/2016   Musculoskeletal chest pain 11/02/2015   History of herpes genitalis 12/14/2014   Skin lesion 04/14/2013   Chronic low back pain 04/14/2013   Leukocytosis 08/04/2012   Generalized anxiety disorder 08/04/2012   Hypogonadism male 10/24/2011   Hypertension 09/24/2011   GERD (gastroesophageal reflux disease) 09/24/2011   Erectile dysfunction 09/24/2011   Past Medical History:  Diagnosis Date   Anxiety    Bronchitis    GERD (gastroesophageal reflux disease)    Hypertension    Insomnia    Low testosterone    Osteoarthritis     Family History  Problem Relation Age of Onset   Lung cancer Father        deceased   Parkinson's disease Mother     Past Surgical History:  Procedure Laterality Date   NASAL SINUS SURGERY     Dr. Carlis Abbott   Unremarkable     WRIST SURGERY     Social History   Occupational History   Not on file  Tobacco Use   Smoking status: Former    Pack years: 0.00    Types: Cigarettes   Smokeless tobacco: Never   Tobacco comments:    smoked age [age] stopped then age 43 x 1 years max 1/2 ppd   Vaping Use   Vaping Use: Never used  Substance and Sexual Activity   Alcohol use: No    Alcohol/week: 0.0 standard drinks   Drug use: No   Sexual activity: Yes

## 2020-06-23 ENCOUNTER — Other Ambulatory Visit: Payer: Self-pay | Admitting: Family Medicine

## 2020-06-23 DIAGNOSIS — F411 Generalized anxiety disorder: Secondary | ICD-10-CM

## 2020-06-23 NOTE — Telephone Encounter (Signed)
RX Refill:xanax Last Seen:04-19-20 Last ordered:05-22-20

## 2020-07-05 ENCOUNTER — Other Ambulatory Visit: Payer: Self-pay | Admitting: Family Medicine

## 2020-07-21 ENCOUNTER — Other Ambulatory Visit: Payer: Self-pay | Admitting: Family Medicine

## 2020-07-21 DIAGNOSIS — F411 Generalized anxiety disorder: Secondary | ICD-10-CM

## 2020-07-21 NOTE — Telephone Encounter (Signed)
Patient was irate due to this medication not being on auto refill and he feels he does not need to come in every 3-4 months. I informed him of policy and he stated he will comply but he has been on this medication longer than the practice has been here and been on it since he was a child and the dosage hasn't changed. Appointment has been scheduled for [id number]

## 2020-07-21 NOTE — Telephone Encounter (Signed)
The patient is due for a follow-up.  He needs this to be scheduled before I will refill this medication.

## 2020-07-21 NOTE — Telephone Encounter (Signed)
RX Refill:xanax Last Seen:04-19-20  Last ordered:06-23-20

## 2020-07-22 NOTE — Telephone Encounter (Signed)
Noted. Refill sent to pharmacy so he will not run out. I will plan to see him for follow-up as scheduled.

## 2020-07-27 ENCOUNTER — Encounter: Payer: Self-pay | Admitting: Family Medicine

## 2020-07-27 ENCOUNTER — Other Ambulatory Visit: Payer: Self-pay

## 2020-07-27 ENCOUNTER — Ambulatory Visit (INDEPENDENT_AMBULATORY_CARE_PROVIDER_SITE_OTHER): Payer: Medicare Other | Admitting: Family Medicine

## 2020-07-27 VITALS — BP 114/62 | HR 94 | Temp 98.6°F | Ht 71.0 in | Wt 175.2 lb

## 2020-07-27 DIAGNOSIS — R768 Other specified abnormal immunological findings in serum: Secondary | ICD-10-CM

## 2020-07-27 DIAGNOSIS — R252 Cramp and spasm: Secondary | ICD-10-CM | POA: Diagnosis not present

## 2020-07-27 DIAGNOSIS — I10 Essential (primary) hypertension: Secondary | ICD-10-CM

## 2020-07-27 DIAGNOSIS — E291 Testicular hypofunction: Secondary | ICD-10-CM

## 2020-07-27 DIAGNOSIS — Z1321 Encounter for screening for nutritional disorder: Secondary | ICD-10-CM | POA: Insufficient documentation

## 2020-07-27 DIAGNOSIS — F411 Generalized anxiety disorder: Secondary | ICD-10-CM

## 2020-07-27 DIAGNOSIS — Z5181 Encounter for therapeutic drug level monitoring: Secondary | ICD-10-CM

## 2020-07-27 DIAGNOSIS — S46219A Strain of muscle, fascia and tendon of other parts of biceps, unspecified arm, initial encounter: Secondary | ICD-10-CM

## 2020-07-27 DIAGNOSIS — M25562 Pain in left knee: Secondary | ICD-10-CM

## 2020-07-27 LAB — CBC
HCT: 45.3 % (ref 39.0–52.0)
Hemoglobin: 15.5 g/dL (ref 13.0–17.0)
MCHC: 34.1 g/dL (ref 30.0–36.0)
MCV: 91.2 fl (ref 78.0–100.0)
Platelets: 236 10*3/uL (ref 150.0–400.0)
RBC: 4.97 Mil/uL (ref 4.22–5.81)
RDW: 13 % (ref 11.5–15.5)
WBC: 10.5 10*3/uL (ref 4.0–10.5)

## 2020-07-27 LAB — BASIC METABOLIC PANEL
BUN: 14 mg/dL (ref 6–23)
CO2: 26 mEq/L (ref 19–32)
Calcium: 9.4 mg/dL (ref 8.4–10.5)
Chloride: 103 mEq/L (ref 96–112)
Creatinine, Ser: 1.06 mg/dL (ref 0.40–1.50)
GFR: 79.2 mL/min (ref >60.00)
Glucose, Bld: 89 mg/dL (ref 70–99)
Potassium: 3.9 mEq/L (ref 3.5–5.1)
Sodium: 140 mEq/L (ref 135–145)

## 2020-07-27 LAB — VITAMIN B12: Vitamin B-12: 323 pg/mL (ref 211–911)

## 2020-07-27 LAB — MAGNESIUM: Magnesium: 1.9 mg/dL (ref 1.5–2.5)

## 2020-07-27 LAB — VITAMIN D 25 HYDROXY (VIT D DEFICIENCY, FRACTURES): VITD: 35.05 ng/mL (ref 30.00–100.00)

## 2020-07-27 LAB — TSH: TSH: 0.68 u[IU]/mL (ref 0.35–5.50)

## 2020-07-27 NOTE — Progress Notes (Signed)
[name], MD Phone: [phone]  Gregory Drown Sr. is a 55 y.o. male who presents today for follow-up.  Anxiety: Patient notes this is stable.  He takes Xanax 3 times daily.  He notes no drowsiness with this.  No alcohol intake with this.  No depression.  Hypertension: Taking lisinopril.  No chest pain, shortness of breath, edema, or lightheadedness.  Hypogonadism: He is on a testosterone supplement.  He wonders what the potential side effects or adverse issues could be with this type of supplementation.  He notes he will discuss this with urology as well.  Knee pain: Patient saw orthopedics and had an injection in his knee.  He notes that helped significantly.  They also discussed viscosupplementation.  The second orthopedist he saw advised there is nothing to do for his shoulder at this time as it was not causing any discomfort.  Elevated ANA: He saw a rheumatology.  They felt as though there was no clinical evidence of a rheumatologic disease.  He had negative labs through rheumatology.  Cramping: Patient notes scattered cramps in his arms and ribs.  Notes he drinks a lot of sweet tea and does not drink as much water.  Social History   Tobacco Use  Smoking Status Former   Types: Cigarettes  Smokeless Tobacco Never  Tobacco Comments   smoked age [age] stopped then age 58 x 1 years max 1/2 ppd     Current Outpatient Medications on File Prior to Visit  Medication Sig Dispense Refill   albuterol (PROVENTIL HFA;VENTOLIN HFA) 108 (90 Base) MCG/ACT inhaler Inhale 1-2 puffs into the lungs every 6 (six) hours as needed for wheezing or shortness of breath. 1 Inhaler 1   ALPRAZolam (XANAX) 0.5 MG tablet TAKE 1 TABLET(0.5 MG) BY MOUTH THREE TIMES DAILY AS NEEDED FOR ANXIETY 90 tablet 0   aspirin 81 MG tablet Take 81 mg by mouth daily.     gabapentin (NEURONTIN) 400 MG capsule TAKE 2 CAPSULES(800 MG) BY MOUTH THREE TIMES DAILY 540 capsule 1   lisinopril (ZESTRIL) 20 MG tablet TAKE 1  TABLET(20 MG) BY MOUTH DAILY 90 tablet 3   magnesium oxide (MAG-OX) 400 MG tablet Take 400 mg by mouth daily.     Multiple Vitamin (MULTIVITAMIN) tablet Take 1 tablet by mouth daily.     omeprazole (PRILOSEC) 40 MG capsule TAKE ONE CAPSULE BY MOUTH DAILY 150 capsule 1   sildenafil (VIAGRA) 100 MG tablet Take 50 mg by mouth daily as needed.     testosterone cypionate (DEPOTESTOSTERONE CYPIONATE) 200 MG/ML injection      testosterone cypionate (DEPOTESTOTERONE CYPIONATE) 100 MG/ML injection Inject 100 mg into the muscle every 14 (fourteen) days. For IM use only     tiZANidine (ZANAFLEX) 4 MG tablet TAKE 1 TABLET(4 MG) BY MOUTH TWICE DAILY AS NEEDED FOR MUSCLE SPASMS 60 tablet 0   triamcinolone cream (KENALOG) 0.1 % Apply 1 application topically 2 (two) times daily. 30 g 0   No current facility-administered medications on file prior to visit.     ROS see history of present illness  Objective  Physical Exam Vitals:   07/27/20 0929  BP: 114/62  Pulse: 94  Temp: 98.6 F (37 C)  SpO2: 98%    BP Readings from Last 3 Encounters:  07/27/20 114/62  06/09/20 117/73  04/19/20 118/70   Wt Readings from Last 3 Encounters:  07/27/20 175 lb 3.2 oz (79.5 kg)  06/09/20 171 lb (77.6 kg)  04/19/20 171 lb 6.4 oz (77.7 kg)  Physical Exam Constitutional:      General: He is not in acute distress.    Appearance: He is not diaphoretic.  Cardiovascular:     Rate and Rhythm: Normal rate and regular rhythm.     Heart sounds: Normal heart sounds.  Pulmonary:     Effort: Pulmonary effort is normal.     Breath sounds: Normal breath sounds.  Musculoskeletal:     Comments: Evidence of right proximal biceps tendon tear on exam  Skin:    General: Skin is warm and dry.  Neurological:     Mental Status: He is alert.     Assessment/Plan: Please see individual problem list.  Problem List Items Addressed This Visit     Biceps tendon tear    He will monitor.       Encounter for vitamin  deficiency screening   Relevant Orders   Vitamin D (25 hydroxy) (Completed)   Generalized anxiety disorder    Generally stable.  We will monitor him on his current Xanax prescription.       Hypertension    Adequate control.  He will continue lisinopril 20 mg once daily.  He will monitor for lightheadedness and low blood pressures.       Hypogonadism male    He will discuss potential side effects of the testosterone supplementation with the urologist when he sees them next I did discuss there is some increased risk of blood clot, cardiovascular disease, and stroke with use of testosterone.  Discussed that it could make prostate cancer worse if he were to get prostate cancer.  Advised to discuss further with his urologist.       Left knee pain    Improved with steroid injection.  He will monitor for now.       Muscle cramps - Primary    Check lab work to evaluate for potential causes.  I encouraged him to increase his water intake.       Relevant Orders   Basic Metabolic Panel (BMET) (Completed)   TSH (Completed)   Magnesium (Completed)   Vitamin D (25 hydroxy) (Completed)   B12 (Completed)   Positive ANA (antinuclear antibody)    Patient has seen rheumatology.  No clinical evidence of rheumatologic disease.       Other Visit Diagnoses     Medication monitoring encounter       Relevant Orders   CBC (Completed)       Return in about 3 months (around 10/27/2020) for medication follow-up/joint pain and stiffness.  This visit occurred during the SARS-CoV-2 public health emergency.  Safety protocols were in place, including screening questions prior to the visit, additional usage of staff PPE, and extensive cleaning of exam room while observing appropriate contact time as indicated for disinfecting solutions.    [name], MD [place]

## 2020-07-27 NOTE — Patient Instructions (Signed)
Nice to see you.  We will check some lab work today and let you know the results.  Please increase your water intake and decrease your sweet tea intake.

## 2020-07-30 NOTE — Assessment & Plan Note (Signed)
He will monitor.

## 2020-07-30 NOTE — Assessment & Plan Note (Signed)
Check lab work to evaluate for potential causes.  I encouraged him to increase his water intake.

## 2020-07-30 NOTE — Assessment & Plan Note (Signed)
He will discuss potential side effects of the testosterone supplementation with the urologist when he sees them next I did discuss there is some increased risk of blood clot, cardiovascular disease, and stroke with use of testosterone.  Discussed that it could make prostate cancer worse if he were to get prostate cancer.  Advised to discuss further with his urologist.

## 2020-07-30 NOTE — Assessment & Plan Note (Signed)
Generally stable.  We will monitor him on his current Xanax prescription.

## 2020-07-30 NOTE — Assessment & Plan Note (Signed)
Adequate control.  He will continue lisinopril 20 mg once daily.  He will monitor for lightheadedness and low blood pressures.

## 2020-07-30 NOTE — Assessment & Plan Note (Signed)
Patient has seen rheumatology.  No clinical evidence of rheumatologic disease.

## 2020-07-30 NOTE — Assessment & Plan Note (Signed)
Improved with steroid injection.  He will monitor for now.

## 2020-08-09 ENCOUNTER — Other Ambulatory Visit: Payer: Self-pay | Admitting: Family Medicine

## 2020-08-09 NOTE — Telephone Encounter (Signed)
Last OV 04/19/20 okay to fill zanaflex? Last fill 07/05/20.

## 2020-08-28 ENCOUNTER — Other Ambulatory Visit: Payer: Self-pay | Admitting: Family Medicine

## 2020-08-28 DIAGNOSIS — F411 Generalized anxiety disorder: Secondary | ICD-10-CM

## 2020-08-30 ENCOUNTER — Other Ambulatory Visit: Payer: Self-pay | Admitting: Family Medicine

## 2020-09-28 ENCOUNTER — Other Ambulatory Visit: Payer: Self-pay | Admitting: Family Medicine

## 2020-09-28 DIAGNOSIS — F411 Generalized anxiety disorder: Secondary | ICD-10-CM

## 2020-09-29 ENCOUNTER — Other Ambulatory Visit: Payer: Self-pay | Admitting: Family Medicine

## 2020-09-29 DIAGNOSIS — F411 Generalized anxiety disorder: Secondary | ICD-10-CM

## 2020-10-27 ENCOUNTER — Other Ambulatory Visit: Payer: Self-pay | Admitting: Family Medicine

## 2020-10-27 DIAGNOSIS — F411 Generalized anxiety disorder: Secondary | ICD-10-CM

## 2020-10-27 NOTE — Telephone Encounter (Signed)
Last fill 09/29/20 last OV 07/27/20 has FU scheduled for 10/31/20

## 2020-10-31 ENCOUNTER — Encounter: Payer: Self-pay | Admitting: Family Medicine

## 2020-10-31 ENCOUNTER — Other Ambulatory Visit: Payer: Self-pay

## 2020-10-31 ENCOUNTER — Ambulatory Visit (INDEPENDENT_AMBULATORY_CARE_PROVIDER_SITE_OTHER): Payer: Medicare Other | Admitting: Family Medicine

## 2020-10-31 VITALS — BP 128/80 | HR 74 | Temp 98.7°F | Ht 71.0 in | Wt 179.0 lb

## 2020-10-31 DIAGNOSIS — Z125 Encounter for screening for malignant neoplasm of prostate: Secondary | ICD-10-CM

## 2020-10-31 DIAGNOSIS — E291 Testicular hypofunction: Secondary | ICD-10-CM | POA: Diagnosis not present

## 2020-10-31 DIAGNOSIS — R0789 Other chest pain: Secondary | ICD-10-CM | POA: Diagnosis not present

## 2020-10-31 DIAGNOSIS — I10 Essential (primary) hypertension: Secondary | ICD-10-CM | POA: Diagnosis not present

## 2020-10-31 DIAGNOSIS — K219 Gastro-esophageal reflux disease without esophagitis: Secondary | ICD-10-CM

## 2020-10-31 DIAGNOSIS — W19XXXA Unspecified fall, initial encounter: Secondary | ICD-10-CM | POA: Insufficient documentation

## 2020-10-31 DIAGNOSIS — F411 Generalized anxiety disorder: Secondary | ICD-10-CM
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
## 2020-10-31 NOTE — Assessment & Plan Note (Signed)
Seems to be well controlled on omeprazole.  He will continue omeprazole 40 mg once daily.

## 2020-10-31 NOTE — Assessment & Plan Note (Addendum)
Single episode of chest tightness that occurred after eating.  Differential includes musculoskeletal chest pain, cardiac chest pain, GI related discomfort.  Unlikely to be PE given stable vitals.  Unlikely to be pulmonary related given normal exam and oxygenation.  EKG performed today and is reassuring.  Given the history and reassuring EKG this would seem less likely to be cardiac chest pain.  Discussed continuing the omeprazole.  Advised to monitor and if this recurs to seek medical attention.

## 2020-10-31 NOTE — Addendum Note (Signed)
Addended by: Leeanne Rio on: 10/31/2020 12:54 PM   Modules accepted: Orders

## 2020-10-31 NOTE — Assessment & Plan Note (Signed)
Generally stable.  He has been stable for many years on his current regimen.  We will continue Xanax as prescribed.

## 2020-10-31 NOTE — Assessment & Plan Note (Signed)
Adequate control.  He will continue lisinopril 20 mg once daily.

## 2020-10-31 NOTE — Assessment & Plan Note (Signed)
The patient will continue to follow with urology.  At his request we will obtain periodic lab work as well.

## 2020-11-28 ENCOUNTER — Other Ambulatory Visit: Payer: Self-pay | Admitting: Family Medicine

## 2020-11-28 DIAGNOSIS — F411 Generalized anxiety disorder: Secondary | ICD-10-CM

## 2020-11-28 NOTE — Telephone Encounter (Signed)
RX Refill: xanax Last Seen: 10-31-20 Last Ordered: 10-27-20 Next Appt: NA

## 2020-12-19 ENCOUNTER — Encounter: Payer: Self-pay | Admitting: Family Medicine

## 2020-12-22 ENCOUNTER — Ambulatory Visit (INDEPENDENT_AMBULATORY_CARE_PROVIDER_SITE_OTHER): Payer: Medicare Other

## 2020-12-22 VITALS — Ht 71.0 in | Wt 179.0 lb

## 2020-12-22 DIAGNOSIS — Z1211 Encounter for screening for malignant neoplasm of colon: Secondary | ICD-10-CM | POA: Diagnosis not present

## 2020-12-22 DIAGNOSIS — Z0000 Encounter for general adult medical examination without abnormal findings: Secondary | ICD-10-CM

## 2020-12-22 NOTE — Patient Instructions (Addendum)
Gregory Stokes , Thank you for taking time to come for your Medicare Wellness Visit. I appreciate your ongoing commitment to your health goals. Please review the following plan we discussed and let me know if I can assist you in the future.   These are the goals we discussed:  Goals       Patient Stated     Increase physical activity (pt-stated)      Walk for exercise Stay hydrated Lower cholesterol        This is a list of the screening recommended for you and due dates:  Health Maintenance  Topic Date Due   Colon Cancer Screening  Never done   Zoster (Shingles) Vaccine (1 of 2) 03/22/2021*   Flu Shot  04/06/2021*   Tetanus Vaccine  12/22/2021*   Hepatitis C Screening: USPSTF Recommendation to screen - Ages 18-79 yo.  12/22/2021*   HIV Screening  Completed   Pneumococcal Vaccination  Aged Out   HPV Vaccine  Aged Out   COVID-19 Vaccine  Discontinued  *Topic was postponed. The date shown is not the original due date.    Advanced directives: not yet completed  Conditions/risks identified: none new  Follow up in one year for your annual wellness visit   Preventive Care 40-64 Years, Male Preventive care refers to lifestyle choices and visits with your health care provider that can promote health and wellness. What does preventive care include? A yearly physical exam. This is also called an annual well check. Dental exams once or twice a year. Routine eye exams. Ask your health care provider how often you should have your eyes checked. Personal lifestyle choices, including: Daily care of your teeth and gums. Regular physical activity. Eating a healthy diet. Avoiding tobacco and drug use. Limiting alcohol use. Practicing safe sex. Taking low-dose aspirin every day starting at age 42. What happens during an annual well check? The services and screenings done by your health care provider during your annual well check will depend on your age, overall health, lifestyle risk  factors, and family history of disease. Counseling  Your health care provider may ask you questions about your: Alcohol use. Tobacco use. Drug use. Emotional well-being. Home and relationship well-being. Sexual activity. Eating habits. Work and work Statistician. Screening  You may have the following tests or measurements: Height, weight, and BMI. Blood pressure. Lipid and cholesterol levels. These may be checked every 5 years, or more frequently if you are over 34 years old. Skin check. Lung cancer screening. You may have this screening every year starting at age 65 if you have a 30-pack-year history of smoking and currently smoke or have quit within the past 15 years. Fecal occult blood test (FOBT) of the stool. You may have this test every year starting at age 63. Flexible sigmoidoscopy or colonoscopy. You may have a sigmoidoscopy every 5 years or a colonoscopy every 10 years starting at age 6. Prostate cancer screening. Recommendations will vary depending on your family history and other risks. Hepatitis C blood test. Hepatitis B blood test. Sexually transmitted disease (STD) testing. Diabetes screening. This is done by checking your blood sugar (glucose) after you have not eaten for a while (fasting). You may have this done every 1-3 years. Discuss your test results, treatment options, and if necessary, the need for more tests with your health care provider. Vaccines  Your health care provider may recommend certain vaccines, such as: Influenza vaccine. This is recommended every year. Tetanus, diphtheria, and acellular pertussis (  Tdap, Td) vaccine. You may need a Td booster every 10 years. Zoster vaccine. You may need this after age 25. Pneumococcal 13-valent conjugate (PCV13) vaccine. You may need this if you have certain conditions and have not been vaccinated. Pneumococcal polysaccharide (PPSV23) vaccine. You may need one or two doses if you smoke cigarettes or if you have  certain conditions. Talk to your health care provider about which screenings and vaccines you need and how often you need them. This information is not intended to replace advice given to you by your health care provider. Make sure you discuss any questions you have with your health care provider. Document Released: 01/20/2015 Document Revised: 09/13/2015 Document Reviewed: 10/25/2014 Elsevier Interactive Patient Education  2017 [employer] Prevention in the Home Falls can cause injuries. They can happen to people of all ages. There are many things you can do to make your home safe and to help prevent falls. What can I do on the outside of my home? Regularly fix the edges of walkways and driveways and fix any cracks. Remove anything that might make you trip as you walk through a door, such as a raised step or threshold. Trim any bushes or trees on the path to your home. Use bright outdoor lighting. Clear any walking paths of anything that might make someone trip, such as rocks or tools. Regularly check to see if handrails are loose or broken. Make sure that both sides of any steps have handrails. Any raised decks and porches should have guardrails on the edges. Have any leaves, snow, or ice cleared regularly. Use sand or salt on walking paths during winter. Clean up any spills in your garage right away. This includes oil or grease spills. What can I do in the bathroom? Use night lights. Install grab bars by the toilet and in the tub and shower. Do not use towel bars as grab bars. Use non-skid mats or decals in the tub or shower. If you need to sit down in the shower, use a plastic, non-slip stool. Keep the floor dry. Clean up any water that spills on the floor as soon as it happens. Remove soap buildup in the tub or shower regularly. Attach bath mats securely with double-sided non-slip rug tape. Do not have throw rugs and other things on the floor that can make you trip. What can  I do in the bedroom? Use night lights. Make sure that you have a light by your bed that is easy to reach. Do not use any sheets or blankets that are too big for your bed. They should not hang down onto the floor. Have a firm chair that has side arms. You can use this for support while you get dressed. Do not have throw rugs and other things on the floor that can make you trip. What can I do in the kitchen? Clean up any spills right away. Avoid walking on wet floors. Keep items that you use a lot in easy-to-reach places. If you need to reach something above you, use a strong step stool that has a grab bar. Keep electrical cords out of the way. Do not use floor polish or wax that makes floors slippery. If you must use wax, use non-skid floor wax. Do not have throw rugs and other things on the floor that can make you trip. What can I do with my stairs? Do not leave any items on the stairs. Make sure that there are handrails on both sides of  the stairs and use them. Fix handrails that are broken or loose. Make sure that handrails are as long as the stairways. Check any carpeting to make sure that it is firmly attached to the stairs. Fix any carpet that is loose or worn. Avoid having throw rugs at the top or bottom of the stairs. If you do have throw rugs, attach them to the floor with carpet tape. Make sure that you have a light switch at the top of the stairs and the bottom of the stairs. If you do not have them, ask someone to add them for you. What else can I do to help prevent falls? Wear shoes that: Do not have high heels. Have rubber bottoms. Are comfortable and fit you well. Are closed at the toe. Do not wear sandals. If you use a stepladder: Make sure that it is fully opened. Do not climb a closed stepladder. Make sure that both sides of the stepladder are locked into place. Ask someone to hold it for you, if possible. Clearly mark and make sure that you can see: Any grab bars or  handrails. First and last steps. Where the edge of each step is. Use tools that help you move around (mobility aids) if they are needed. These include: Canes. Walkers. Scooters. Crutches. Turn on the lights when you go into a dark area. Replace any light bulbs as soon as they burn out. Set up your furniture so you have a clear path. Avoid moving your furniture around. If any of your floors are uneven, fix them. If there are any pets around you, be aware of where they are. Review your medicines with your doctor. Some medicines can make you feel dizzy. This can increase your chance of falling. Ask your doctor what other things that you can do to help prevent falls. This information is not intended to replace advice given to you by your health care provider. Make sure you discuss any questions you have with your health care provider. Document Released: 10/20/2008 Document Revised: 06/01/2015 Document Reviewed: 01/28/2014 Elsevier Interactive Patient Education  2017 [employer].

## 2020-12-22 NOTE — Progress Notes (Signed)
Subjective:   Gregory Drown Sr. is a 55 y.o. male who presents for Medicare Annual/Subsequent preventive examination.  Review of Systems    No ROS.  Medicare Wellness Virtual Visit.  Visual/audio telehealth visit, UTA vital signs.   See social history for additional risk factors.   Cardiac Risk Factors include: advanced age (>61men, >20 women);male gender;hypertension     Objective:    Today's Vitals   12/22/20 1406  Weight: 179 lb (81.2 kg)  Height: 5\' 11"  (1.803 m)   Body mass index is 24.97 kg/m.  Advanced Directives 12/22/2020 12/22/2019 08/12/2015  Does Patient Have a Medical Advance Directive? No No No  Would patient like information on creating a medical advance directive? No - Patient declined No - Patient declined No - patient declined information    Current Medications (verified) Outpatient Encounter Medications as of 12/22/2020  Medication Sig   albuterol (PROVENTIL HFA;VENTOLIN HFA) 108 (90 Base) MCG/ACT inhaler Inhale 1-2 puffs into the lungs every 6 (six) hours as needed for wheezing or shortness of breath.   ALPRAZolam (XANAX) 0.5 MG tablet TAKE 1 TABLET(0.5 MG) BY MOUTH THREE TIMES DAILY AS NEEDED FOR ANXIETY   aspirin 81 MG tablet Take 81 mg by mouth daily.   gabapentin (NEURONTIN) 400 MG capsule TAKE 2 CAPSULES(800 MG) BY MOUTH THREE TIMES DAILY   lisinopril (ZESTRIL) 20 MG tablet TAKE 1 TABLET(20 MG) BY MOUTH DAILY   magnesium oxide (MAG-OX) 400 MG tablet Take 400 mg by mouth daily.   Multiple Vitamin (MULTIVITAMIN) tablet Take 1 tablet by mouth daily.   omeprazole (PRILOSEC) 40 MG capsule TAKE ONE CAPSULE BY MOUTH DAILY   sildenafil (VIAGRA) 100 MG tablet Take 50 mg by mouth daily as needed.   testosterone cypionate (DEPOTESTOSTERONE CYPIONATE) 200 MG/ML injection    testosterone cypionate (DEPOTESTOTERONE CYPIONATE) 100 MG/ML injection Inject 100 mg into the muscle every 14 (fourteen) days. For IM use only   tiZANidine (ZANAFLEX) 4 MG tablet TAKE 1  TABLET(4 MG) BY MOUTH TWICE DAILY AS NEEDED FOR MUSCLE SPASMS   triamcinolone cream (KENALOG) 0.1 % Apply 1 application topically 2 (two) times daily.   No facility-administered encounter medications on file as of 12/22/2020.    Allergies (verified) Tessalon perles [benzonatate]   History: Past Medical History:  Diagnosis Date   Anxiety    Bronchitis    GERD (gastroesophageal reflux disease)    Hypertension    Insomnia    Low testosterone    Osteoarthritis    Past Surgical History:  Procedure Laterality Date   NASAL SINUS SURGERY     Dr. Scheryl Marten     WRIST SURGERY     Family History  Problem Relation Age of Onset   Lung cancer Father        deceased   Parkinson's disease Mother    Social History   Socioeconomic History   Marital status: Married    Spouse name: Not on file   Number of children: Not on file   Years of education: Not on file   Highest education level: Not on file  Occupational History   Not on file  Tobacco Use   Smoking status: Former    Types: Cigarettes   Smokeless tobacco: Never   Tobacco comments:    smoked age [age] stopped then age 45 x 1 years max 1/2 ppd   Vaping Use   Vaping Use: Never used  Substance and Sexual Activity   Alcohol use: No  Alcohol/week: 0.0 standard drinks   Drug use: No   Sexual activity: Yes  Other Topics Concern   Not on file  Social History Narrative   Lives in [place] with wife      Work - Designer, multimedia [employer]   Social Determinants of Radio broadcast assistant Strain: Low Risk    Difficulty of Paying Living Expenses: Not hard at all  Food Insecurity: No Food Insecurity   Worried About Charity fundraiser in the Last Year: Never true   Arboriculturist in the Last Year: Never true  Transportation Needs: No Transportation Needs   Lack of Transportation (Medical): No   Lack of Transportation (Non-Medical): No  Physical Activity: Insufficiently Active   Days of Exercise per Week: 3 days    Minutes of Exercise per Session: 30 min  Stress: No Stress Concern Present   Feeling of Stress : Not at all  Social Connections: Unknown   Frequency of Communication with Friends and Family: Not on file   Frequency of Social Gatherings with Friends and Family: Not on file   Attends Religious Services: Not on Electrical engineer or Organizations: Not on file   Attends Archivist Meetings: Not on file   Marital Status: Married    Tobacco Counseling Counseling given: Not Answered Tobacco comments: smoked age [age] stopped then age 50 x 1 years max 1/2 ppd    Clinical Intake:  Pre-visit preparation completed: Yes        Diabetes: No  How often do you need to have someone help you when you read instructions, pamphlets, or other written materials from your doctor or pharmacy?: 1 - Never  Interpreter Needed?: No      Activities of Daily Living In your present state of health, do you have any difficulty performing the following activities: 12/22/2020  Hearing? N  Vision? N  Difficulty concentrating or making decisions? N  Walking or climbing stairs? N  Comment Paces self. Cane in use as needed.  Dressing or bathing? N  Doing errands, shopping? N  Preparing Food and eating ? N  Comment Wife assist with meal prep. Self feeds.  Using the Toilet? N  In the past six months, have you accidently leaked urine? N  Do you have problems with loss of bowel control? N  Managing your Medications? N  Comment Wife assist as needed  Managing your Finances? N  Comment Wife assist as needed  Housekeeping or managing your Housekeeping? N  Comment Wife assist as needed  Some recent data might be hidden    Patient Care Team: Leone Haven, MD as PCP - General (Family Medicine)  Indicate any recent Medical Services you may have received from other than [facility] providers in the past year (date may be approximate).     Assessment:   This is a routine wellness  examination for [name].   Virtual Visit via Telephone Note  I connected with  Gregory Drown Sr. on 12/22/20 at  2:00 PM EST by telephone and verified that I am speaking with the correct person using two identifiers.  Persons participating in the virtual visit: patient/Nurse Health Advisor   I discussed the limitations, risks, security and privacy concerns of performing an evaluation and management service by telephone and the availability of in person appointments. The patient expressed understanding and agreed to proceed.  Interactive audio and video telecommunications were attempted between this nurse and patient, however failed, due  to patient having technical difficulties OR patient did not have access to video capability.  We continued and completed visit with audio only.  Some vital signs may be absent or patient reported.   Hearing/Vision screen Hearing Screening - Comments:: Patient is able to hear conversational tones without difficulty.  No issues reported. Vision Screening - Comments:: Wears corrective lenses They have seen their ophthalmologist in the last 12 months.    Dietary issues and exercise activities discussed: Current Exercise Habits: Home exercise routine, Type of exercise: walking;stretching, Time (Minutes): 30, Frequency (Times/Week): 3, Weekly Exercise (Minutes/Week): 90, Intensity: Mild   Goals Addressed               This Visit's Progress     Patient Stated     Increase physical activity (pt-stated)        Walk for exercise Stay hydrated Lower cholesterol       Depression Screen PHQ 2/9 Scores 12/22/2020 10/31/2020 04/19/2020 12/22/2019 11/12/2019 05/28/2019 01/27/2019  PHQ - 2 Score 0 0 0 0 0 0 0    Fall Risk Fall Risk  12/22/2020 10/31/2020 04/19/2020 12/22/2019 11/12/2019  Falls in the past year? 0 0 0 0 0  Number falls in past yr: 0 0 0 0 0  Injury with Fall? - - - 0 -  Follow up Falls evaluation completed Falls evaluation completed Falls  evaluation completed Falls evaluation completed Falls evaluation completed    [place]: Home free of loose throw rugs in walkways, pet beds, electrical cords, etc? Yes  Adequate lighting in your home to reduce risk of falls? Yes   ASSISTIVE DEVICES UTILIZED TO PREVENT FALLS: Life alert? No  Use of a cane, walker or w/c? Yes , as needed  TIMED UP AND GO: Was the test performed? No .   Cognitive Function:     6CIT Screen 12/22/2020  What Year? 0 points  What month? 0 points  What time? 0 points    Immunizations Immunization History  Administered Date(s) Administered   Influenza,inj,Quad PF,6+ Mos 01/30/2015   Tdap 09/23/2009    TDAP status: Due, Education has been provided regarding the importance of this vaccine. Advised may receive this vaccine at local pharmacy or Health Dept. Aware to provide a copy of the vaccination record if obtained from local pharmacy or Health Dept. Verbalized acceptance and understanding. Plans to discuss all noted vaccines listed with pcp.   Shingrix Completed?: No.    Education has been provided regarding the importance of this vaccine. Patient has been advised to call insurance company to determine out of pocket expense if they have not yet received this vaccine. Advised may also receive vaccine at local pharmacy or Health Dept. Verbalized acceptance and understanding.  Screening Tests Health Maintenance  Topic Date Due   COLONOSCOPY (Pts 45-73yrs Insurance coverage will need to be confirmed)  Never done   Zoster Vaccines- Shingrix (1 of 2) 03/22/2021 (Originally 05/29/2015)   INFLUENZA VACCINE  04/06/2021 (Originally 08/07/2020)   TETANUS/TDAP  12/22/2021 (Originally 09/24/2019)   Hepatitis C Screening  12/22/2021 (Originally [birth year]/1983)   HIV Screening  Completed   Pneumococcal Vaccine 66-82 Years old  Aged Out   HPV VACCINES  Aged Out   COVID-19 Vaccine  Discontinued   Health Maintenance Health Maintenance  Due  Topic Date Due   COLONOSCOPY (Pts 45-11yrs Insurance coverage will need to be confirmed)  Never done   Colonoscopy- ordered per consent.  Lung Cancer Screening: (Low Dose CT  Chest recommended if Age 67-80 years, 30 pack-year currently smoking OR have quit w/in 15years.) does not qualify.   Hepatitis C Screening: deferred.   Vision Screening: Recommended annual ophthalmology exams for early detection of glaucoma and other disorders of the eye.  Dental Screening: Recommended annual dental exams for proper oral hygiene.  Community Resource Referral / Chronic Care Management: CRR required this visit?  No   CCM required this visit?  No      Plan:   Keep all routine maintenance appointments.   I have personally reviewed and noted the following in the patients chart:   Medical and social history Use of alcohol, tobacco or illicit drugs  Current medications and supplements including opioid prescriptions. Patient is not currently taking opioid prescriptions. Functional ability and status Nutritional status Physical activity Advanced directives List of other physicians Hospitalizations, surgeries, and ER visits in previous 12 months Vitals Screenings to include cognitive, depression, and falls Referrals and appointments  In addition, I have reviewed and discussed with patient certain preventive protocols, quality metrics, and best practice recommendations. A written personalized care plan for preventive services as well as general preventive health recommendations were provided to patient.     Varney Biles, LPN   [id number]

## 2020-12-27 ENCOUNTER — Other Ambulatory Visit: Payer: Self-pay

## 2020-12-27 ENCOUNTER — Other Ambulatory Visit: Payer: Self-pay | Admitting: Family Medicine

## 2020-12-27 DIAGNOSIS — Z1211 Encounter for screening for malignant neoplasm of colon: Secondary | ICD-10-CM

## 2020-12-27 DIAGNOSIS — F411 Generalized anxiety disorder: Secondary | ICD-10-CM

## 2020-12-27 MED ORDER — PEG 3350-KCL-NA BICARB-NACL 420 G PO SOLR: 4000.0000 mL | Freq: Once | ORAL | 0 refills | Status: AC

## 2020-12-27 NOTE — Progress Notes (Signed)
Gastroenterology Pre-Procedure Review  Request Date: 01/24/2021 Requesting Physician: Dr. Marius Ditch  PATIENT REVIEW QUESTIONS: The patient responded to the following health history questions as indicated:    1. Are you having any GI issues? no 2. Do you have a personal history of Polyps? no 3. Do you have a family history of Colon Cancer or Polyps? no 4. Diabetes Mellitus? no 5. Joint replacements in the past 12 months?no 6. Major health problems in the past 3 months?no 7. Any artificial heart valves, MVP, or defibrillator?no    MEDICATIONS & ALLERGIES:    Patient reports the following regarding taking any anticoagulation/antiplatelet therapy:   Plavix, Coumadin, Eliquis, Xarelto, Lovenox, Pradaxa, Brilinta, or Effient? no Aspirin? yes (81 mg)  Patient confirms/reports the following medications:  Current Outpatient Medications  Medication Sig Dispense Refill   albuterol (PROVENTIL HFA;VENTOLIN HFA) 108 (90 Base) MCG/ACT inhaler Inhale 1-2 puffs into the lungs every 6 (six) hours as needed for wheezing or shortness of breath. 1 Inhaler 1   ALPRAZolam (XANAX) 0.5 MG tablet TAKE 1 TABLET(0.5 MG) BY MOUTH THREE TIMES DAILY AS NEEDED FOR ANXIETY 90 tablet 0   aspirin 81 MG tablet Take 81 mg by mouth daily.     gabapentin (NEURONTIN) 400 MG capsule TAKE 2 CAPSULES(800 MG) BY MOUTH THREE TIMES DAILY 540 capsule 1   lisinopril (ZESTRIL) 20 MG tablet TAKE 1 TABLET(20 MG) BY MOUTH DAILY 90 tablet 3   magnesium oxide (MAG-OX) 400 MG tablet Take 400 mg by mouth daily.     Multiple Vitamin (MULTIVITAMIN) tablet Take 1 tablet by mouth daily.     omeprazole (PRILOSEC) 40 MG capsule TAKE ONE CAPSULE BY MOUTH DAILY 150 capsule 1   sildenafil (VIAGRA) 100 MG tablet Take 50 mg by mouth daily as needed.     testosterone cypionate (DEPOTESTOSTERONE CYPIONATE) 200 MG/ML injection      testosterone cypionate (DEPOTESTOTERONE CYPIONATE) 100 MG/ML injection Inject 100 mg into the muscle every 14 (fourteen) days.  For IM use only     tiZANidine (ZANAFLEX) 4 MG tablet TAKE 1 TABLET(4 MG) BY MOUTH TWICE DAILY AS NEEDED FOR MUSCLE SPASMS 60 tablet 0   triamcinolone cream (KENALOG) 0.1 % Apply 1 application topically 2 (two) times daily. 30 g 0   No current facility-administered medications for this visit.    Patient confirms/reports the following allergies:  Allergies  Allergen Reactions   Tessalon Perles [Benzonatate]     drowsy    No orders of the defined types were placed in this encounter.   AUTHORIZATION INFORMATION Primary Insurance: 1D#: Group #:  Secondary Insurance: 1D#: Group #:  SCHEDULE INFORMATION: Date: 01/24/2021 Time: Location: [place]

## 2020-12-28 NOTE — Telephone Encounter (Signed)
I will send in one refill though this patient needs follow-up scheduled for future refills to be sent in. He was supposed to schedule follow-up for 3 months after his last visit when he left the office after his October visit. Please get him scheduled for the next available appointment.

## 2020-12-28 NOTE — Telephone Encounter (Signed)
RX Refill: xanax Last Seen: 10-31-20 Last Ordered: 11-29-20 Next Appt: NA

## 2020-12-29 ENCOUNTER — Other Ambulatory Visit: Payer: Self-pay | Admitting: Family Medicine

## 2021-01-09 ENCOUNTER — Other Ambulatory Visit: Payer: Self-pay | Admitting: Family Medicine

## 2021-01-12 ENCOUNTER — Other Ambulatory Visit: Payer: Self-pay

## 2021-01-12 ENCOUNTER — Ambulatory Visit (INDEPENDENT_AMBULATORY_CARE_PROVIDER_SITE_OTHER): Payer: Medicare Other | Admitting: Orthopaedic Surgery

## 2021-01-12 ENCOUNTER — Encounter: Payer: Self-pay | Admitting: Orthopaedic Surgery

## 2021-01-12 DIAGNOSIS — M1712 Unilateral primary osteoarthritis, left knee: Secondary | ICD-10-CM

## 2021-01-12 MED ORDER — METHYLPREDNISOLONE ACETATE 40 MG/ML IJ SUSP
40.0000 mg | INTRAMUSCULAR | Status: AC | PRN
  Administered 2021-01-12: 40 mg via INTRA_ARTICULAR

## 2021-01-12 MED ORDER — BUPIVACAINE HCL 0.25 % IJ SOLN
2.0000 mL | INTRAMUSCULAR | Status: AC | PRN
  Administered 2021-01-12: 2 mL via INTRA_ARTICULAR

## 2021-01-12 MED ORDER — LIDOCAINE HCL 1 % IJ SOLN
2.0000 mL | INTRAMUSCULAR | Status: AC | PRN
  Administered 2021-01-12: 2 mL

## 2021-01-12 NOTE — Progress Notes (Signed)
Office Visit Note   Patient: Gregory NEZ Sr.           Date of Birth: [birth year]           MRN: [id number] Visit Date: 01/12/2021              Requested by: Leone Haven, MD [address] [place],  [place] [address] PCP: Leone Haven, MD   Assessment & Plan: Visit Diagnoses:  1. Unilateral primary osteoarthritis, left knee     Plan: Impression is left knee osteoarthritis.  Today, we discussed repeat cortisone injection for which she is agreeable to.  We have also discussed viscosupplementation injection in the future if needed.  He will follow-up with [place] as needed.  Follow-Up Instructions: Return if symptoms worsen or fail to improve.   Orders:  Orders Placed This Encounter  Procedures   Large Joint Inj   No orders of the defined types were placed in this encounter.     Procedures: Large Joint Inj: L knee on 01/12/2021 9:59 AM Indications: pain Details: 22 G needle, anterolateral approach Medications: 2 mL lidocaine 1 %; 2 mL bupivacaine 0.25 %; 40 mg methylPREDNISolone acetate 40 MG/ML     Clinical Data: No additional findings.   Subjective: Chief Complaint  Patient presents with   Left Knee - Pain    HPI patient is a pleasant 56 year old gentleman who comes in today with recurrent left knee pain.  He was seen in our office back in June of this year where he was diagnosed with osteoarthritis primarily to the patellofemoral and lateral compartments.  A month prior, he was seen at [facility] or cortisone injection was performed.  He notes that this injection helped for several months.  His pain is recently returned.  The pain is to the entire knee worse with walking and straight lines.  He does not take any medication for the pain.  He is requesting repeat cortisone injection today.  Review of Systems as detailed in HPI.  All others reviewed and are negative.   Objective: Vital Signs: There were no vitals taken for this  visit.  Physical Exam well-developed well-nourished gentleman in no acute distress.  Alert and oriented x3.  Ortho Exam left knee exam shows a small effusion.  Range of motion 0 to 125 degrees.  Lateral joint line tenderness.  Mild patellofemoral crepitus.  He is neurovascular tact distally.  Specialty Comments:  No specialty comments available.  Imaging: No new imaging   PMFS History: Patient Active Problem List   Diagnosis Date Noted   Chest tightness 10/31/2020   Fall 10/31/2020   Muscle cramps 07/27/2020   Positive ANA (antinuclear antibody) 06/09/2020   Left knee pain 04/19/2020   Biceps tendon tear 04/19/2020   Right shoulder pain 04/19/2020   Arthralgia 04/19/2020   Overweight 05/28/2019   TMJ dysfunction 08/13/2017   Right wrist pain 02/03/2017   Hypersomnia 04/22/2016   Rash and nonspecific skin eruption 04/15/2016   Leg cramps 04/15/2016   History of herpes genitalis 12/14/2014   Skin lesion 04/14/2013   Chronic low back pain 04/14/2013   Leukocytosis 08/04/2012   Generalized anxiety disorder 08/04/2012   Hypogonadism male 10/24/2011   Hypertension 09/24/2011   GERD (gastroesophageal reflux disease) 09/24/2011   Erectile dysfunction 09/24/2011   Past Medical History:  Diagnosis Date   Anxiety    Bronchitis    GERD (gastroesophageal reflux disease)    Hypertension    Insomnia  Low testosterone    Osteoarthritis     Family History  Problem Relation Age of Onset   Lung cancer Father        deceased   Parkinson's disease Mother     Past Surgical History:  Procedure Laterality Date   NASAL SINUS SURGERY     Dr. Carlis Abbott   Unremarkable     WRIST SURGERY     Social History   Occupational History   Not on file  Tobacco Use   Smoking status: Former    Types: Cigarettes   Smokeless tobacco: Never   Tobacco comments:    smoked age [age] stopped then age 48 x 1 years max 1/2 ppd   Vaping Use   Vaping Use: Never used  Substance and Sexual  Activity   Alcohol use: No    Alcohol/week: 0.0 standard drinks   Drug use: No   Sexual activity: Yes

## 2021-01-24 ENCOUNTER — Ambulatory Visit: Payer: Medicare Other | Admitting: Anesthesiology

## 2021-01-24 ENCOUNTER — Ambulatory Visit
Admission: RE | Admit: 2021-01-24 | Discharge: 2021-01-24 | Disposition: A | Discharge status: Home / Self Care | Payer: Medicare Other | Attending: Gastroenterology | Admitting: Gastroenterology

## 2021-01-24 ENCOUNTER — Encounter
Admission: RE | Disposition: A | Discharge status: Home / Self Care | Payer: Self-pay | Source: Home / Self Care | Attending: Gastroenterology

## 2021-01-24 DIAGNOSIS — F419 Anxiety disorder, unspecified: Secondary | ICD-10-CM | POA: Insufficient documentation

## 2021-01-24 DIAGNOSIS — K635 Polyp of colon: Secondary | ICD-10-CM | POA: Diagnosis not present

## 2021-01-24 DIAGNOSIS — I10 Essential (primary) hypertension: Secondary | ICD-10-CM | POA: Insufficient documentation

## 2021-01-24 DIAGNOSIS — D123 Benign neoplasm of transverse colon: Secondary | ICD-10-CM | POA: Insufficient documentation

## 2021-01-24 DIAGNOSIS — Z87891 Personal history of nicotine dependence: Secondary | ICD-10-CM | POA: Insufficient documentation

## 2021-01-24 DIAGNOSIS — Z1211 Encounter for screening for malignant neoplasm of colon: Secondary | ICD-10-CM | POA: Diagnosis not present

## 2021-01-24 DIAGNOSIS — K219 Gastro-esophageal reflux disease without esophagitis: Secondary | ICD-10-CM | POA: Insufficient documentation

## 2021-01-24 HISTORY — PX: COLONOSCOPY WITH PROPOFOL: SHX5780

## 2021-01-24 SURGERY — COLONOSCOPY WITH PROPOFOL
Anesthesia: General

## 2021-01-24 MED ORDER — LIDOCAINE HCL (PF) 2 % IJ SOLN
INTRAMUSCULAR | Status: AC
  Filled 2021-01-24: qty 5

## 2021-01-24 MED ORDER — PROPOFOL 500 MG/50ML IV EMUL
INTRAVENOUS | Status: AC
  Filled 2021-01-24: qty 50

## 2021-01-24 MED ORDER — LIDOCAINE HCL (CARDIAC) PF 100 MG/5ML IV SOSY
PREFILLED_SYRINGE | INTRAVENOUS | Status: DC | PRN
  Administered 2021-01-24: 50 mg via INTRAVENOUS

## 2021-01-24 MED ORDER — PROPOFOL 10 MG/ML IV BOLUS
INTRAVENOUS | Status: DC | PRN
  Administered 2021-01-24: 300 ug/kg/min via INTRAVENOUS

## 2021-01-24 MED ORDER — MIDAZOLAM HCL 2 MG/2ML IJ SOLN
INTRAMUSCULAR | Status: DC | PRN
  Administered 2021-01-24: 2 mg via INTRAVENOUS

## 2021-01-24 MED ORDER — MIDAZOLAM HCL 2 MG/2ML IJ SOLN
INTRAMUSCULAR | Status: AC
  Filled 2021-01-24: qty 2

## 2021-01-24 MED ORDER — SODIUM CHLORIDE 0.9 % IV SOLN
INTRAVENOUS | Status: DC
  Administered 2021-01-24: 20 mL/h via INTRAVENOUS

## 2021-01-24 NOTE — Op Note (Signed)
[facility] Patient Name: Gregory Stokes Procedure Date: 01/24/2021 10:11 AM MRN: [id number] Account #: [id number] Date of Birth: 1965/11/25 Admit Type: Outpatient Age: 56 Room: [facility] ENDO ROOM 4 Gender: Male Note Status: Finalized Instrument Name: Park Meo [id number] Procedure:             Colonoscopy Indications:           Screening for colorectal malignant neoplasm, This is                         the patient's first colonoscopy Providers:             Lin Landsman MD, MD Referring MD:          Angela Adam. Caryl Bis (Referring MD) Medicines:             General Anesthesia Complications:         No immediate complications. Estimated blood loss: None. Procedure:             Pre-Anesthesia Assessment:                        - Prior to the procedure, a History and Physical was                         performed, and patient medications and allergies were                         reviewed. The patient is competent. The risks and                         benefits of the procedure and the sedation options and                         risks were discussed with the patient. All questions                         were answered and informed consent was obtained.                         Patient identification and proposed procedure were                         verified by the physician, the nurse, the                         anesthesiologist, the anesthetist and the technician                         in the pre-procedure area in the procedure room in the                         endoscopy suite. Mental Status Examination: alert and                         oriented. Airway Examination: normal oropharyngeal                         airway and neck mobility. Respiratory Examination:  clear to auscultation. CV Examination: normal.                         Prophylactic Antibiotics: The patient does not require                         prophylactic  antibiotics. Prior Anticoagulants: The                         patient has taken no previous anticoagulant or                         antiplatelet agents. ASA Grade Assessment: II - A                         patient with mild systemic disease. After reviewing                         the risks and benefits, the patient was deemed in                         satisfactory condition to undergo the procedure. The                         anesthesia plan was to use general anesthesia.                         Immediately prior to administration of medications,                         the patient was re-assessed for adequacy to receive                         sedatives. The heart rate, respiratory rate, oxygen                         saturations, blood pressure, adequacy of pulmonary                         ventilation, and response to care were monitored                         throughout the procedure. The physical status of the                         patient was re-assessed after the procedure.                        After obtaining informed consent, the colonoscope was                         passed under direct vision. Throughout the procedure,                         the patient's blood pressure, pulse, and oxygen                         saturations were monitored continuously. The  Colonoscope was introduced through the anus and                         advanced to the the cecum, identified by appendiceal                         orifice and ileocecal valve. The colonoscopy was                         performed without difficulty. The patient tolerated                         the procedure well. The quality of the bowel                         preparation was evaluated using the BBPS [facility] Bowel                         Preparation Scale) with scores of: Right Colon = 2                         (minor amount of residual staining, small fragments of                          stool and/or opaque liquid, but mucosa seen well),                         Transverse Colon = 2 (minor amount of residual                         staining, small fragments of stool and/or opaque                         liquid, but mucosa seen well) and Left Colon = 2                         (minor amount of residual staining, small fragments of                         stool and/or opaque liquid, but mucosa seen well). The                         total BBPS score equals 6. The quality of the bowel                         preparation was fair. Findings:      The perianal and digital rectal examinations were normal. Pertinent       negatives include normal sphincter tone and no palpable rectal lesions.      Three sessile polyps were found in the sigmoid colon and transverse       colon. The polyps were 3 to 5 mm in size. These polyps were removed with       a cold snare. Resection and retrieval were complete.      Copious quantities of semi-liquid stool was found in the entire colon,       precluding visualization. Lavage of the area was performed using 50 -  200 mL of sterile water, resulting in clearance with fair visualization.      The retroflexed view of the distal rectum and anal verge was normal and       showed no anal or rectal abnormalities. Impression:            - Preparation of the colon was fair.                        - Three 3 to 5 mm polyps in the sigmoid colon and in                         the transverse colon, removed with a cold snare.                         Resected and retrieved.                        - Stool in the entire examined colon.                        - The distal rectum and anal verge are normal on                         retroflexion view. Recommendation:        - Discharge patient to home (with escort).                        - Resume previous diet today.                        - Continue present medications.                        - Await  pathology results.                        - Repeat colonoscopy in 3 years with 2 day prep                         because the bowel preparation was suboptimal/fair. Procedure Code(s):     --- Professional ---                        [phone], Colonoscopy, flexible; with removal of                         tumor(s), polyp(s), or other lesion(s) by snare                         technique Diagnosis Code(s):     --- Professional ---                        Z12.11, Encounter for screening for malignant neoplasm                         of colon                        K63.5, Polyp of colon CPT copyright 2019 American Medical Association. All rights reserved.  The codes documented in this report are preliminary and upon coder review may  be revised to meet current compliance requirements. Dr. Ulyess Mort Lin Landsman MD, MD 01/24/2021 10:40:26 AM This report has been signed electronically. Number of Addenda: 0 Note Initiated On: 01/24/2021 10:11 AM Scope Withdrawal Time: 0 hours 17 minutes 46 seconds  Total Procedure Duration: 0 hours 19 minutes 39 seconds  Estimated Blood Loss:  Estimated blood loss: none.      [facility]

## 2021-01-24 NOTE — Transfer of Care (Signed)
Immediate Anesthesia Transfer of Care Note  Patient: Gregory DROSS Sr.  Procedure(s) Performed: COLONOSCOPY WITH PROPOFOL  Patient Location: PACU and Endoscopy Unit  Anesthesia Type:MAC  Level of Consciousness: sedated  Airway & Oxygen Therapy: Patient Spontanous Breathing  Post-op Assessment: Report given to RN  Post vital signs: Reviewed and stable  Last Vitals:  Vitals Value Taken Time  BP 89/51 01/24/21 1045  Temp    Pulse 77 01/24/21 1047  Resp 15 01/24/21 1047  SpO2 98 % 01/24/21 1047  Vitals shown include unvalidated device data.  Last Pain:  Vitals:   01/24/21 0920  TempSrc: Temporal  PainSc: 0-No pain         Complications: No notable events documented.

## 2021-01-24 NOTE — Anesthesia Postprocedure Evaluation (Signed)
Anesthesia Post Note  Patient: Gregory GOMER Sr.  Procedure(s) Performed: COLONOSCOPY WITH PROPOFOL  Patient location during evaluation: PACU Anesthesia Type: General Level of consciousness: awake and alert, oriented and patient cooperative Pain management: pain level controlled Vital Signs Assessment: post-procedure vital signs reviewed and stable Respiratory status: spontaneous breathing, nonlabored ventilation and respiratory function stable Cardiovascular status: blood pressure returned to baseline and stable Postop Assessment: adequate PO intake Anesthetic complications: no   No notable events documented.   Last Vitals:  Vitals:   01/24/21 1044 01/24/21 1054  BP: (!) 89/51 126/87  Pulse:    Resp:    Temp: (!) 36.2 C   SpO2:      Last Pain:  Vitals:   01/24/21 1104  TempSrc:   PainSc: 0-No pain                 Darrin Nipper

## 2021-01-24 NOTE — Anesthesia Preprocedure Evaluation (Addendum)
Anesthesia Evaluation  Patient identified by MRN, date of birth, ID band Patient awake    Reviewed: Allergy & Precautions, NPO status , Patient's Chart, lab work & pertinent test results  History of Anesthesia Complications Negative for: history of anesthetic complications  Airway Mallampati: IV   Neck ROM: Full    Dental no notable dental hx.    Pulmonary former smoker (quit 10 years ago),    Pulmonary exam normal breath sounds clear to auscultation       Cardiovascular hypertension, Normal cardiovascular exam Rhythm:Regular Rate:Normal  ECG 10/31/20: normal   Neuro/Psych PSYCHIATRIC DISORDERS Anxiety negative neurological ROS     GI/Hepatic GERD  ,  Endo/Other  negative endocrine ROS  Renal/GU negative Renal ROS     Musculoskeletal  (+) Arthritis ,   Abdominal   Peds  Hematology negative hematology ROS (+)   Anesthesia Other Findings   Reproductive/Obstetrics                            Anesthesia Physical Anesthesia Plan  ASA: 2  Anesthesia Plan: General   Post-op Pain Management:    Induction: Intravenous  PONV Risk Score and Plan: 2 and Propofol infusion, TIVA and Treatment may vary due to age or medical condition  Airway Management Planned: Natural Airway  Additional Equipment:   Intra-op Plan:   Post-operative Plan:   Informed Consent: I have reviewed the patients History and Physical, chart, labs and discussed the procedure including the risks, benefits and alternatives for the proposed anesthesia with the patient or authorized representative who has indicated his/her understanding and acceptance.       Plan Discussed with: CRNA  Anesthesia Plan Comments: (LMA/GETA backup discussed.  Patient consented for risks of anesthesia including but not limited to:  - adverse reactions to medications - damage to eyes, teeth, lips or other oral mucosa - nerve damage due  to positioning  - sore throat or hoarseness - damage to heart, brain, nerves, lungs, other parts of body or loss of life  Informed patient about role of CRNA in peri- and intra-operative care.  Patient voiced understanding.)        Anesthesia Quick Evaluation

## 2021-01-24 NOTE — H&P (Signed)
[name], MD [address]  [place]  [place],  [address]  Main: [phone]  Fax: [phone] Pager: [phone]  Primary Care Physician:  Leone Haven, MD Primary Gastroenterologist:  Dr. [name]  Pre-Procedure History & Physical: HPI:  Gregory BRAY Sr. is a 56 y.o. male is here for an colonoscopy.   Past Medical History:  Diagnosis Date   Anxiety    Bronchitis    GERD (gastroesophageal reflux disease)    Hypertension    Insomnia    Low testosterone    Osteoarthritis     Past Surgical History:  Procedure Laterality Date   NASAL SINUS SURGERY     Dr. Scheryl Marten     WRIST SURGERY      Prior to Admission medications   Medication Sig Start Date End Date Taking? Authorizing Provider  albuterol (PROVENTIL HFA;VENTOLIN HFA) 108 (90 Base) MCG/ACT inhaler Inhale 1-2 puffs into the lungs every 6 (six) hours as needed for wheezing or shortness of breath. 02/27/17   McLean-Scocuzza, Nino Glow, MD  ALPRAZolam Duanne Moron) 0.5 MG tablet TAKE 1 TABLET(0.5 MG) BY MOUTH THREE TIMES DAILY AS NEEDED FOR ANXIETY 12/28/20   Leone Haven, MD  aspirin 81 MG tablet Take 81 mg by mouth daily.    [provider]  gabapentin (NEURONTIN) 400 MG capsule TAKE 2 CAPSULES(800 MG) BY MOUTH THREE TIMES DAILY 05/23/20   Leone Haven, MD  lisinopril (ZESTRIL) 20 MG tablet TAKE 1 TABLET(20 MG) BY MOUTH DAILY 04/19/20   Leone Haven, MD  magnesium oxide (MAG-OX) 400 MG tablet Take 400 mg by mouth daily.    [provider]  Multiple Vitamin (MULTIVITAMIN) tablet Take 1 tablet by mouth daily.    [provider]  omeprazole (PRILOSEC) 40 MG capsule TAKE ONE CAPSULE BY MOUTH DAILY 01/09/21   Leone Haven, MD  sildenafil (VIAGRA) 100 MG tablet Take 50 mg by mouth daily as needed.    [provider]  testosterone cypionate (DEPOTESTOSTERONE CYPIONATE) 200 MG/ML injection  07/24/18   [provider]  testosterone  cypionate (DEPOTESTOTERONE CYPIONATE) 100 MG/ML injection Inject 100 mg into the muscle every 14 (fourteen) days. For IM use only    [provider]  tiZANidine (ZANAFLEX) 4 MG tablet TAKE 1 TABLET(4 MG) BY MOUTH TWICE DAILY AS NEEDED FOR MUSCLE SPASMS 12/29/20   Leone Haven, MD  triamcinolone cream (KENALOG) 0.1 % Apply 1 application topically 2 (two) times daily. 04/15/16   Leone Haven, MD    Allergies as of 12/27/2020 - Review Complete 12/22/2020  Allergen Reaction Noted   Tessalon perles [benzonatate]  03/02/2017    Family History  Problem Relation Age of Onset   Lung cancer Father        deceased   Parkinson's disease Mother     Social History   Socioeconomic History   Marital status: Married    Spouse name: Not on file   Number of children: Not on file   Years of education: Not on file   Highest education level: Not on file  Occupational History   Not on file  Tobacco Use   Smoking status: Former    Types: Cigarettes   Smokeless tobacco: Never   Tobacco comments:    smoked age [age] stopped then age 16 x 1 years max 1/2 ppd   Vaping Use   Vaping Use: Never used  Substance and Sexual Activity   Alcohol use: No  Alcohol/week: 0.0 standard drinks   Drug use: No   Sexual activity: Yes  Other Topics Concern   Not on file  Social History Narrative   Lives in [place] with wife      Work - Designer, multimedia [employer]   Social Determinants of Radio broadcast assistant Strain: Low Risk    Difficulty of Paying Living Expenses: Not hard at all  Food Insecurity: No Food Insecurity   Worried About Charity fundraiser in the Last Year: Never true   Arboriculturist in the Last Year: Never true  Transportation Needs: No Transportation Needs   Lack of Transportation (Medical): No   Lack of Transportation (Non-Medical): No  Physical Activity: Insufficiently Active   Days of Exercise per Week: 3 days   Minutes of Exercise per Session: 30 min  Stress: No Stress  Concern Present   Feeling of Stress : Not at all  Social Connections: Unknown   Frequency of Communication with Friends and Family: Not on file   Frequency of Social Gatherings with Friends and Family: Not on file   Attends Religious Services: Not on Electrical engineer or Organizations: Not on file   Attends Archivist Meetings: Not on file   Marital Status: Married  Human resources officer Violence: Not At Risk   Fear of Current or Ex-Partner: No   Emotionally Abused: No   Physically Abused: No   Sexually Abused: No    Review of Systems: See HPI, otherwise negative ROS  Physical Exam: BP 128/82    Pulse 68    Temp (!) 97 F (36.1 C) (Temporal)    Resp 20    Ht 5\' 11"  (1.803 m)    Wt 79.4 kg    SpO2 98%    BMI 24.41 kg/m  General:   Alert,  pleasant and cooperative in NAD Head:  Normocephalic and atraumatic. Neck:  Supple; no masses or thyromegaly. Lungs:  Clear throughout to auscultation.    Heart:  Regular rate and rhythm. Abdomen:  Soft, nontender and nondistended. Normal bowel sounds, without guarding, and without rebound.   Neurologic:  Alert and  oriented x4;  grossly normal neurologically.  Impression/Plan: Gregory Drown Sr. is here for an colonoscopy to be performed for colon cancer screening  Risks, benefits, limitations, and alternatives regarding  colonoscopy have been reviewed with the patient.  Questions have been answered.  All parties agreeable.   Sherri Sear, MD  01/24/2021, 10:04 AM

## 2021-01-25 ENCOUNTER — Encounter: Payer: Self-pay | Admitting: Gastroenterology

## 2021-01-25 LAB — SURGICAL PATHOLOGY

## 2021-01-29 ENCOUNTER — Other Ambulatory Visit: Payer: Self-pay | Admitting: Family Medicine

## 2021-01-29 DIAGNOSIS — F411 Generalized anxiety disorder: Secondary | ICD-10-CM

## 2021-03-01 ENCOUNTER — Other Ambulatory Visit: Payer: Self-pay | Admitting: Family

## 2021-03-01 DIAGNOSIS — F411 Generalized anxiety disorder: Secondary | ICD-10-CM

## 2021-03-05 ENCOUNTER — Encounter: Payer: Self-pay | Admitting: Family Medicine

## 2021-03-05 ENCOUNTER — Other Ambulatory Visit: Payer: Self-pay | Admitting: Family Medicine

## 2021-03-05 DIAGNOSIS — F411 Generalized anxiety disorder: Secondary | ICD-10-CM

## 2021-03-05 MED ORDER — ALPRAZOLAM 0.5 MG PO TABS: ORAL_TABLET | ORAL | 0 refills | Status: DC

## 2021-03-26 ENCOUNTER — Other Ambulatory Visit: Payer: Self-pay | Admitting: Family Medicine

## 2021-03-26 DIAGNOSIS — G8929 Other chronic pain: Secondary | ICD-10-CM

## 2021-03-28 ENCOUNTER — Other Ambulatory Visit: Payer: Self-pay | Admitting: Family Medicine

## 2021-03-28 DIAGNOSIS — F411 Generalized anxiety disorder: Secondary | ICD-10-CM

## 2021-03-28 NOTE — Telephone Encounter (Signed)
Refilled: 03/05/2021 ?Last OV: 10/31/2020 ?Next OV: 04/06/2021 ?

## 2021-04-06 ENCOUNTER — Encounter: Payer: Self-pay | Admitting: Family Medicine

## 2021-04-06 ENCOUNTER — Ambulatory Visit (INDEPENDENT_AMBULATORY_CARE_PROVIDER_SITE_OTHER): Payer: Medicare Other

## 2021-04-06 ENCOUNTER — Ambulatory Visit (INDEPENDENT_AMBULATORY_CARE_PROVIDER_SITE_OTHER): Payer: Medicare Other | Admitting: Family Medicine

## 2021-04-06 ENCOUNTER — Telehealth: Payer: Self-pay

## 2021-04-06 VITALS — BP 105/70 | HR 76 | Temp 98.7°F | Ht 71.0 in | Wt 175.6 lb

## 2021-04-06 DIAGNOSIS — G8929 Other chronic pain: Secondary | ICD-10-CM

## 2021-04-06 DIAGNOSIS — Z125 Encounter for screening for malignant neoplasm of prostate: Secondary | ICD-10-CM

## 2021-04-06 DIAGNOSIS — I10 Essential (primary) hypertension: Secondary | ICD-10-CM | POA: Diagnosis not present

## 2021-04-06 DIAGNOSIS — M25511 Pain in right shoulder: Secondary | ICD-10-CM | POA: Diagnosis not present

## 2021-04-06 DIAGNOSIS — M25562 Pain in left knee: Secondary | ICD-10-CM

## 2021-04-06 DIAGNOSIS — E291 Testicular hypofunction: Secondary | ICD-10-CM

## 2021-04-06 DIAGNOSIS — R0789 Other chest pain: Secondary | ICD-10-CM

## 2021-04-06 DIAGNOSIS — Z13220 Encounter for screening for lipoid disorders: Secondary | ICD-10-CM | POA: Diagnosis not present

## 2021-04-06 DIAGNOSIS — F411 Generalized anxiety disorder: Secondary | ICD-10-CM

## 2021-04-06 LAB — HEPATIC FUNCTION PANEL
ALT: 18 U/L (ref 0–53)
AST: 23 U/L (ref 0–37)
Albumin: 4.7 g/dL (ref 3.5–5.2)
Alkaline Phosphatase: 79 U/L (ref 39–117)
Bilirubin, Direct: 0.1 mg/dL (ref 0.0–0.3)
Total Bilirubin: 0.7 mg/dL (ref 0.2–1.2)
Total Protein: 7.1 g/dL (ref 6.0–8.3)

## 2021-04-06 LAB — CBC
HCT: 47.9 % (ref 39.0–52.0)
Hemoglobin: 16.4 g/dL (ref 13.0–17.0)
MCHC: 34.1 g/dL (ref 30.0–36.0)
MCV: 89.8 fl (ref 78.0–100.0)
Platelets: 285 10*3/uL (ref 150.0–400.0)
RBC: 5.34 Mil/uL (ref 4.22–5.81)
RDW: 13.1 % (ref 11.5–15.5)
WBC: 11 10*3/uL — ABNORMAL HIGH (ref 4.0–10.5)

## 2021-04-06 LAB — LIPID PANEL
Cholesterol: 195 mg/dL (ref 0–200)
HDL: 37 mg/dL — ABNORMAL LOW (ref >39.00)
LDL Cholesterol: 143 mg/dL — ABNORMAL HIGH (ref 0–99)
NonHDL: 158.18
Total CHOL/HDL Ratio: 5
Triglycerides: 76 mg/dL (ref 0.0–149.0)
VLDL: 15.2 mg/dL (ref 0.0–40.0)

## 2021-04-06 LAB — BASIC METABOLIC PANEL
BUN: 15 mg/dL (ref 6–23)
CO2: 27 mEq/L (ref 19–32)
Calcium: 9.9 mg/dL (ref 8.4–10.5)
Chloride: 101 mEq/L (ref 96–112)
Creatinine, Ser: 1.07 mg/dL (ref 0.40–1.50)
GFR: 77.93 mL/min (ref >60.00)
Glucose, Bld: 76 mg/dL (ref 70–99)
Potassium: 4.8 mEq/L (ref 3.5–5.1)
Sodium: 136 mEq/L (ref 135–145)

## 2021-04-06 LAB — TESTOSTERONE: Testosterone: 1394.64 ng/dL — ABNORMAL HIGH (ref 300.00–890.00)

## 2021-04-06 LAB — PSA, MEDICARE: PSA: 0.76 ng/ml (ref 0.10–4.00)

## 2021-04-06 NOTE — Assessment & Plan Note (Signed)
Chronic issue.  This could possibly be arthritic related versus a rotator cuff issue.  Discussed doing an x-ray today.  Discussed completing exercises at home.  Exercises sent to him through [place]. ?

## 2021-04-06 NOTE — Assessment & Plan Note (Signed)
Adequately controlled.  We will check lab work today.  He will continue lisinopril 20 mg once daily. ?

## 2021-04-06 NOTE — Patient Instructions (Signed)
Nice to see you. ?We will get an x-ray and labs today.  ?We will try to get the MRI set up for you.  ?

## 2021-04-06 NOTE — Assessment & Plan Note (Signed)
Generally stable.  He has been stable on this regimen for many years.  He will continue Xanax as prescribed.  I did discuss the long-term risk of dementia with benzodiazepine use.  I also discussed the risk of addiction and dependence. ?

## 2021-04-06 NOTE — Progress Notes (Signed)
?[name], MD ?Phone: [phone] ? ?Gregory Drown Sr. is a 56 y.o. male who presents today for f/u. ? ?HYPERTENSION ?Disease Monitoring ?Home BP Monitoring not checking often Chest pain- no    Dyspnea- no ?Medications ?Compliance-  taking lisinopril. Lightheadedness- no  Edema- no ?BMET ?   ?Component Value Date/Time  ? NA 140 07/27/2020 1002  ? K 3.9 07/27/2020 1002  ? CL 103 07/27/2020 1002  ? CO2 26 07/27/2020 1002  ? GLUCOSE 89 07/27/2020 1002  ? BUN 14 07/27/2020 1002  ? CREATININE 1.06 07/27/2020 1002  ? CALCIUM 9.4 07/27/2020 1002  ? GFRNONAA >60 09/07/2017 1229  ? GFRAA >60 09/07/2017 1229  ? ?Chest soreness: Patient notes ongoing issues with left chest soreness.  Notes the discomfort when he lays down or when he coughs.  Notes if he turns certain ways he can precipitate the pain.  He notes no exertional discomfort.  No radiation, diaphoresis, or shortness of breath. ? ?Anxiety: Patient notes this is stable.  He takes Xanax.  No drowsiness with this.  No depression or SI. ? ?Left knee pain: This is a chronic issue.  This has been going on over a year.  Notes it pops and in the past has given out on him.  He notes he had an injection 8 to 12 weeks ago though it has not been as beneficial as his first injection was a year ago.  He has pain with walking.  He notes some swelling. ? ?Right shoulder pain: This has been going on a year plus as well.  He notes a burning discomfort in his shoulder at night.  It pops with movements.  He notes abduction of the shoulder hurts.  He attributes this issue to driving an 18 wheeler without power steering. ? ?Social History  ? ?Tobacco Use  ?Smoking Status Former  ? Types: Cigarettes  ?Smokeless Tobacco Never  ?Tobacco Comments  ? smoked age [age] stopped then age 51 x 1 years max 1/2 ppd   ? ? ?Current Outpatient Medications on File Prior to Visit  ?Medication Sig Dispense Refill  ? albuterol (PROVENTIL HFA;VENTOLIN HFA) 108 (90 Base) MCG/ACT inhaler Inhale 1-2  puffs into the lungs every 6 (six) hours as needed for wheezing or shortness of breath. 1 Inhaler 1  ? ALPRAZolam (XANAX) 0.5 MG tablet TAKE 1 TABLET(0.5 MG) BY MOUTH THREE TIMES DAILY AS NEEDED FOR ANXIETY 90 tablet 1  ? aspirin 81 MG tablet Take 81 mg by mouth daily.    ? gabapentin (NEURONTIN) 400 MG capsule TAKE 2 CAPSULES(800 MG) BY MOUTH THREE TIMES DAILY 540 capsule 1  ? lisinopril (ZESTRIL) 20 MG tablet TAKE 1 TABLET(20 MG) BY MOUTH DAILY 90 tablet 3  ? magnesium oxide (MAG-OX) 400 MG tablet Take 400 mg by mouth daily.    ? Multiple Vitamin (MULTIVITAMIN) tablet Take 1 tablet by mouth daily.    ? omeprazole (PRILOSEC) 40 MG capsule TAKE ONE CAPSULE BY MOUTH DAILY 150 capsule 1  ? sildenafil (VIAGRA) 100 MG tablet Take 50 mg by mouth daily as needed.    ? testosterone cypionate (DEPOTESTOSTERONE CYPIONATE) 200 MG/ML injection     ? testosterone cypionate (DEPOTESTOTERONE CYPIONATE) 100 MG/ML injection Inject 100 mg into the muscle every 14 (fourteen) days. For IM use only    ? tiZANidine (ZANAFLEX) 4 MG tablet TAKE 1 TABLET(4 MG) BY MOUTH TWICE DAILY AS NEEDED FOR MUSCLE SPASMS 60 tablet 0  ? triamcinolone cream (KENALOG) 0.1 % Apply 1 application topically  2 (two) times daily. 30 g 0  ? ?No current facility-administered medications on file prior to visit.  ? ? ? ?ROS see history of present illness ? ?Objective ? ?Physical Exam ?Vitals:  ? 04/06/21 0913  ?BP: 105/70  ?Pulse: 76  ?Temp: 98.7 ?F (37.1 ?C)  ?SpO2: 97%  ? ? ?BP Readings from Last 3 Encounters:  ?04/06/21 105/70  ?01/24/21 126/87  ?10/31/20 128/80  ? ?Wt Readings from Last 3 Encounters:  ?04/06/21 175 lb 9.6 oz (79.7 kg)  ?01/24/21 175 lb (79.4 kg)  ?12/22/20 179 lb (81.2 kg)  ? ? ?Physical Exam ?Constitutional:   ?   General: He is not in acute distress. ?   Appearance: He is not diaphoretic.  ?Cardiovascular:  ?   Rate and Rhythm: Normal rate and regular rhythm.  ?   Heart sounds: Normal heart sounds.  ?Pulmonary:  ?   Effort: Pulmonary effort  is normal.  ?   Breath sounds: Normal breath sounds.  ?Chest:  ?   Comments: The patient is tender over his left chest ?Musculoskeletal:  ?   Comments: Right shoulder is nontender, he has full range of motion actively and passively of bilateral shoulders, there is some discomfort in the right shoulder on active and passive abduction and external rotation, some discomfort on empty can testing of the right, left knee appears slightly swollen though there is no warmth or erythema, there is no tenderness, negative McMurray's  ?Skin: ?   General: Skin is warm and dry.  ?Neurological:  ?   Mental Status: He is alert.  ? ? ? ?Assessment/Plan: Please see individual problem list. ? ?Problem List Items Addressed This Visit   ? ? Generalized anxiety disorder (Chronic)  ?  Generally stable.  He has been stable on this regimen for many years.  He will continue Xanax as prescribed.  I did discuss the long-term risk of dementia with benzodiazepine use.  I also discussed the risk of addiction and dependence. ?  ?  ? Hypertension - Primary (Chronic)  ?  Adequately controlled.  We will check lab work today.  He will continue lisinopril 20 mg once daily. ?  ?  ? Left knee pain (Chronic)  ?  This is a chronic issue.  The clicking is somewhat concerning for a meniscal issue.  His prior x-ray revealed significant arthritic changes.  Discussed try to get an MRI completed to help determine if this is a meniscal issue or an arthritic issue.  Depending on the results he will need to see orthopedics to consider appropriate treatment. ?  ?  ? Relevant Orders  ? MR Knee Left  Wo Contrast  ? Right shoulder pain (Chronic)  ?  Chronic issue.  This could possibly be arthritic related versus a rotator cuff issue.  Discussed doing an x-ray today.  Discussed completing exercises at home.  Exercises sent to him through [facility]. ?  ?  ? Relevant Orders  ? DG Shoulder Right  ? Hypogonadism male  ? Musculoskeletal chest pain  ?  Symptoms are consistent  with a musculoskeletal cause of his discomfort.  He will monitor at this time. ?  ?  ? ?Other Visit Diagnoses   ? ? Lipid screening      ? Relevant Orders  ? Lipid panel  ? Hepatic function panel  ? Prostate cancer screening      ? ?  ? ? ?Return in about 3 months (around 07/06/2021) for anxiety. ? ?This visit occurred  during the SARS-CoV-2 public health emergency.  Safety protocols were in place, including screening questions prior to the visit, additional usage of staff PPE, and extensive cleaning of exam room while observing appropriate contact time as indicated for disinfecting solutions.  ? ? ?[name], MD ?[facility] ? ?

## 2021-04-06 NOTE — Assessment & Plan Note (Signed)
Symptoms are consistent with a musculoskeletal cause of his discomfort.  He will monitor at this time. ?

## 2021-04-06 NOTE — Assessment & Plan Note (Signed)
This is a chronic issue.  The clicking is somewhat concerning for a meniscal issue.  His prior x-ray revealed significant arthritic changes.  Discussed try to get an MRI completed to help determine if this is a meniscal issue or an arthritic issue.  Depending on the results he will need to see orthopedics to consider appropriate treatment. ?

## 2021-04-12 ENCOUNTER — Telehealth: Payer: Self-pay

## 2021-04-12 NOTE — Telephone Encounter (Signed)
Pt will call back to schedule lab appt in 3 wks, orders placed. ?

## 2021-04-17 ENCOUNTER — Ambulatory Visit
Admission: RE | Admit: 2021-04-17 | Discharge: 2021-04-17 | Disposition: A | Discharge status: Home / Self Care | Payer: Medicare Other | Source: Ambulatory Visit | Attending: Family Medicine | Admitting: Family Medicine

## 2021-04-17 DIAGNOSIS — M25562 Pain in left knee: Secondary | ICD-10-CM | POA: Diagnosis present

## 2021-04-17 DIAGNOSIS — G8929 Other chronic pain: Secondary | ICD-10-CM | POA: Diagnosis present

## 2021-04-18 ENCOUNTER — Other Ambulatory Visit: Payer: Self-pay | Admitting: Family Medicine

## 2021-04-18 ENCOUNTER — Encounter: Payer: Self-pay | Admitting: Family Medicine

## 2021-04-18 DIAGNOSIS — M23207 Derangement of unspecified meniscus due to old tear or injury, left knee: Secondary | ICD-10-CM

## 2021-04-23 ENCOUNTER — Encounter: Payer: Self-pay | Admitting: Orthopaedic Surgery

## 2021-04-24 NOTE — Telephone Encounter (Signed)
We would like for him to come in to discuss these results

## 2021-05-02 ENCOUNTER — Other Ambulatory Visit: Payer: Self-pay | Admitting: Family Medicine

## 2021-05-02 DIAGNOSIS — I10 Essential (primary) hypertension: Secondary | ICD-10-CM

## 2021-05-03 ENCOUNTER — Ambulatory Visit (INDEPENDENT_AMBULATORY_CARE_PROVIDER_SITE_OTHER): Payer: Medicare Other | Admitting: Orthopaedic Surgery

## 2021-05-03 ENCOUNTER — Ambulatory Visit: Payer: Self-pay

## 2021-05-03 DIAGNOSIS — M1712 Unilateral primary osteoarthritis, left knee: Secondary | ICD-10-CM

## 2021-05-03 NOTE — Progress Notes (Signed)
? ?Office Visit Note ?  ?Patient: Gregory POND Sr.           ?Date of Birth: 11-25-1965           ?MRN: [id number] ?Visit Date: 05/03/2021 ?             ?Requested by: Leone Haven, MD ?[address] ?[address] ?[place],  [place] [address] ?PCP: Leone Haven, MD ? ? ?Assessment & Plan: ?Visit Diagnoses:  ?1. Primary osteoarthritis of left knee   ? ? ?Plan: MRI results are consistent with tricompartmental DJD with degenerative medial and lateral meniscus tears.  Findings were explained and reviewed with Gregory Stokes in detail and treatment options were explained and their associated pros and cons.  We basically talked about the options of scope debridement versus total knee replacement and the reliability and pain relief.  Based on his options he has elected to move forward with a left total knee replacement soon as possible.  Denies history of nickel allergy or DVT.  Currently takes a baby aspirin.  Gregory Stokes will contact the patient in the near future. ? ?Follow-Up Instructions: No follow-ups on file.  ? ?Orders:  ?Orders Placed This Encounter  ?Procedures  ? XR KNEE 3 VIEW LEFT  ? ?No orders of the defined types were placed in this encounter. ? ? ? ? Procedures: ?No procedures performed ? ? ?Clinical Data: ?No additional findings. ? ? ?Subjective: ?Chief Complaint  ?Patient presents with  ? Left Knee - Follow-up  ?  MRI review  ? ? ?HPI ? ?[name] returns today to discuss left knee MRI results.  He continues to have constant swelling and popping and pain with daily activities.  He has had 2 cortisone injections so far with the most recent one giving minimal relief. ? ?Review of Systems  ?Constitutional: Negative.   ?All other systems reviewed and are negative. ? ? ?Objective: ?Vital Signs: There were no vitals taken for this visit. ? ?Physical Exam ?Vitals and nursing note reviewed.  ?Constitutional:   ?   Appearance: He is well-developed.  ?HENT:  ?   Head: Normocephalic and atraumatic.  ?Eyes:  ?   Pupils: Pupils  are equal, round, and reactive to light.  ?Pulmonary:  ?   Effort: Pulmonary effort is normal.  ?Abdominal:  ?   Palpations: Abdomen is soft.  ?Musculoskeletal:     ?   General: Normal range of motion.  ?   Cervical back: Neck supple.  ?Skin: ?   General: Skin is warm.  ?Neurological:  ?   Mental Status: He is alert and oriented to person, place, and time.  ?Psychiatric:     ?   Behavior: Behavior normal.     ?   Thought Content: Thought content normal.     ?   Judgment: Judgment normal.  ? ? ?Ortho Exam ? ?Examination of the left knee shows trace effusion.  2+ crepitus with range of motion.  Pain wit flexion of the knee past 70 degrees.  Collaterals and cruciates are stable.  Slight joint line tenderness. ? ?Specialty Comments:  ?No specialty comments available. ? ?Imaging: ?No results found. ? ? ?PMFS History: ?Patient Active Problem List  ? Diagnosis Date Noted  ? Primary osteoarthritis of left knee 05/03/2021  ? Colon cancer screening   ? Polyp of colon   ? Musculoskeletal chest pain 10/31/2020  ? Fall 10/31/2020  ? Muscle cramps 07/27/2020  ? Positive ANA (antinuclear antibody) 06/09/2020  ? Left knee  pain 04/19/2020  ? Biceps tendon tear 04/19/2020  ? Right shoulder pain 04/19/2020  ? Arthralgia 04/19/2020  ? Overweight 05/28/2019  ? TMJ dysfunction 08/13/2017  ? Right wrist pain 02/03/2017  ? Hypersomnia 04/22/2016  ? Rash and nonspecific skin eruption 04/15/2016  ? Leg cramps 04/15/2016  ? History of herpes genitalis 12/14/2014  ? Skin lesion 04/14/2013  ? Chronic low back pain 04/14/2013  ? Leukocytosis 08/04/2012  ? Generalized anxiety disorder 08/04/2012  ? Hypogonadism male 10/24/2011  ? Hypertension 09/24/2011  ? GERD (gastroesophageal reflux disease) 09/24/2011  ? Erectile dysfunction 09/24/2011  ? ?Past Medical History:  ?Diagnosis Date  ? Anxiety   ? Bronchitis   ? GERD (gastroesophageal reflux disease)   ? Hypertension   ? Insomnia   ? Low testosterone   ? Osteoarthritis   ?  ?Family History   ?Problem Relation Age of Onset  ? Lung cancer Father   ?     deceased  ? Parkinson's disease Mother   ?  ?Past Surgical History:  ?Procedure Laterality Date  ? COLONOSCOPY WITH PROPOFOL N/A 01/24/2021  ? Procedure: COLONOSCOPY WITH PROPOFOL;  Surgeon: Lin Landsman, MD;  Location: [facility] ENDOSCOPY;  Service: Gastroenterology;  Laterality: N/A;  ? NASAL SINUS SURGERY    ? Dr. Carlis Abbott  ? Unremarkable    ? WRIST SURGERY    ? ?Social History  ? ?Occupational History  ? Not on file  ?Tobacco Use  ? Smoking status: Former  ?  Types: Cigarettes  ? Smokeless tobacco: Never  ? Tobacco comments:  ?  smoked age [age] stopped then age 67 x 1 years max 1/2 ppd   ?Vaping Use  ? Vaping Use: Never used  ?Substance and Sexual Activity  ? Alcohol use: No  ?  Alcohol/week: 0.0 standard drinks  ? Drug use: No  ? Sexual activity: Yes  ? ? ? ? ? ? ?

## 2021-05-28 ENCOUNTER — Other Ambulatory Visit: Payer: Self-pay | Admitting: Family Medicine

## 2021-05-28 DIAGNOSIS — F411 Generalized anxiety disorder: Secondary | ICD-10-CM

## 2021-05-31 NOTE — Telephone Encounter (Signed)
Refilled: 03/28/2021 Last OV: 04/06/2021 Next OV: 07/11/2021

## 2021-06-13 ENCOUNTER — Other Ambulatory Visit: Payer: Self-pay

## 2021-06-29 ENCOUNTER — Other Ambulatory Visit: Payer: Self-pay | Admitting: Family Medicine

## 2021-06-29 DIAGNOSIS — F411 Generalized anxiety disorder: Secondary | ICD-10-CM

## 2021-07-02 ENCOUNTER — Other Ambulatory Visit: Payer: Self-pay

## 2021-07-02 ENCOUNTER — Encounter (HOSPITAL_COMMUNITY): Payer: Self-pay

## 2021-07-02 ENCOUNTER — Encounter (HOSPITAL_COMMUNITY)
Admission: RE | Admit: 2021-07-02 | Discharge: 2021-07-02 | Disposition: A | Discharge status: Home / Self Care | Payer: Medicare Other | Source: Ambulatory Visit | Attending: Orthopaedic Surgery | Admitting: Orthopaedic Surgery

## 2021-07-02 VITALS — BP 150/88 | HR 75 | Temp 98.0°F | Resp 18 | Ht 71.0 in | Wt 173.5 lb

## 2021-07-02 DIAGNOSIS — Z01812 Encounter for preprocedural laboratory examination: Secondary | ICD-10-CM | POA: Diagnosis present

## 2021-07-02 DIAGNOSIS — M1712 Unilateral primary osteoarthritis, left knee: Secondary | ICD-10-CM | POA: Diagnosis not present

## 2021-07-02 DIAGNOSIS — Z01818 Encounter for other preprocedural examination: Secondary | ICD-10-CM

## 2021-07-02 DIAGNOSIS — E291 Testicular hypofunction: Secondary | ICD-10-CM | POA: Insufficient documentation

## 2021-07-02 LAB — COMPREHENSIVE METABOLIC PANEL
ALT: 17 U/L (ref 0–44)
AST: 17 U/L (ref 15–41)
Albumin: 3.9 g/dL (ref 3.5–5.0)
Alkaline Phosphatase: 74 U/L (ref 38–126)
Anion gap: 5 (ref 5–15)
BUN: 12 mg/dL (ref 6–20)
CO2: 27 mmol/L (ref 22–32)
Calcium: 9.4 mg/dL (ref 8.9–10.3)
Chloride: 110 mmol/L (ref 98–111)
Creatinine, Ser: 1.1 mg/dL (ref 0.61–1.24)
GFR, Estimated: >60 mL/min (ref >60)
Glucose, Bld: 89 mg/dL (ref 70–99)
Potassium: 4.5 mmol/L (ref 3.5–5.1)
Sodium: 142 mmol/L (ref 135–145)
Total Bilirubin: 0.5 mg/dL (ref 0.3–1.2)
Total Protein: 6.7 g/dL (ref 6.5–8.1)

## 2021-07-02 LAB — CBC
HCT: 47.1 % (ref 39.0–52.0)
Hemoglobin: 16.1 g/dL (ref 13.0–17.0)
MCH: 31 pg (ref 26.0–34.0)
MCHC: 34.2 g/dL (ref 30.0–36.0)
MCV: 90.8 fL (ref 80.0–100.0)
Platelets: 251 10*3/uL (ref 150–400)
RBC: 5.19 MIL/uL (ref 4.22–5.81)
RDW: 12.8 % (ref 11.5–15.5)
WBC: 11.9 10*3/uL — ABNORMAL HIGH (ref 4.0–10.5)
nRBC: 0 % (ref 0.0–0.2)

## 2021-07-02 LAB — SURGICAL PCR SCREEN
MRSA, PCR: NEGATIVE
Staphylococcus aureus: NEGATIVE

## 2021-07-03 ENCOUNTER — Other Ambulatory Visit: Payer: Self-pay | Admitting: Family Medicine

## 2021-07-06 ENCOUNTER — Telehealth: Payer: Self-pay | Admitting: *Deleted

## 2021-07-06 ENCOUNTER — Other Ambulatory Visit: Payer: Self-pay | Admitting: *Deleted

## 2021-07-06 DIAGNOSIS — M1712 Unilateral primary osteoarthritis, left knee: Secondary | ICD-10-CM

## 2021-07-06 NOTE — Telephone Encounter (Signed)
Ortho bundle Pre-op call completed. 

## 2021-07-06 NOTE — Care Plan (Signed)
[facility] RNCM call to patient prior to surgery to discuss his upcoming Left total knee arthroplasty with Dr. Erlinda Hong on 07/09/21. Patient is an Ortho bundle patient through [facility] and is agreeable to case management. He lives with his spouse, who will be assisting after discharge. He has had a CPM delivered to him by [employer] on 07/05/21. His wife states they have a RW already. No other DME needed at this time. Anticipate HHPT will be needed after short hospital stay. Choice provided and referral to [facility] done. Patient also scheduled to begin OPPT at [facility] on 07/26/21 at 2:30 pm. Reviewed all post op care instructions with patient's wife and copy mailed to their home. Will continue to follow for needs.

## 2021-07-08 ENCOUNTER — Encounter (HOSPITAL_COMMUNITY): Payer: Self-pay | Admitting: Orthopaedic Surgery

## 2021-07-08 ENCOUNTER — Other Ambulatory Visit: Payer: Self-pay | Admitting: Physician Assistant

## 2021-07-08 MED ORDER — ASPIRIN 81 MG PO TBEC: 81.0000 mg | DELAYED_RELEASE_TABLET | Freq: Two times a day (BID) | ORAL | 2 refills | Status: AC

## 2021-07-08 MED ORDER — OXYCODONE-ACETAMINOPHEN 5-325 MG PO TABS: 1.0000 | ORAL_TABLET | Freq: Four times a day (QID) | ORAL | 0 refills | Status: DC | PRN

## 2021-07-08 MED ORDER — DOCUSATE SODIUM 100 MG PO CAPS: 100.0000 mg | ORAL_CAPSULE | Freq: Every day | ORAL | 2 refills | Status: AC | PRN

## 2021-07-08 MED ORDER — TRANEXAMIC ACID 1000 MG/10ML IV SOLN
2000.0000 mg | INTRAVENOUS | Status: DC
  Filled 2021-07-08 (×2): qty 20

## 2021-07-08 MED ORDER — ONDANSETRON HCL 4 MG PO TABS: 4.0000 mg | ORAL_TABLET | Freq: Three times a day (TID) | ORAL | 0 refills | Status: DC | PRN

## 2021-07-08 NOTE — Anesthesia Preprocedure Evaluation (Addendum)
Anesthesia Evaluation  Patient identified by MRN, date of birth, ID band Patient awake    Reviewed: Allergy & Precautions, NPO status , Patient's Chart, lab work & pertinent test results, reviewed documented beta blocker date and time   Airway Mallampati: III  TM Distance: >3 FB Neck ROM: Full    Dental no notable dental hx. (+) Dental Advisory Given, Teeth Intact   Pulmonary former smoker,    Pulmonary exam normal breath sounds clear to auscultation       Cardiovascular hypertension, Pt. on medications Normal cardiovascular exam Rhythm:Regular Rate:Normal     Neuro/Psych PSYCHIATRIC DISORDERS Anxiety negative neurological ROS     GI/Hepatic Neg liver ROS, GERD  Medicated and Controlled,  Endo/Other  negative endocrine ROSLow testosterone syndrome  Renal/GU negative Renal ROS  negative genitourinary   Musculoskeletal  (+) Arthritis , Osteoarthritis,  OA left knee   Abdominal   Peds  Hematology negative hematology ROS (+)   Anesthesia Other Findings   Reproductive/Obstetrics ED                            Anesthesia Physical Anesthesia Plan  ASA: 2  Anesthesia Plan: Spinal   Post-op Pain Management: Regional block* and Tylenol PO (pre-op)*   Induction: Intravenous  PONV Risk Score and Plan: 2 and Propofol infusion, Treatment may vary due to age or medical condition, Midazolam and Ondansetron  Airway Management Planned: Natural Airway  Additional Equipment: None  Intra-op Plan:   Post-operative Plan:   Informed Consent: I have reviewed the patients History and Physical, chart, labs and discussed the procedure including the risks, benefits and alternatives for the proposed anesthesia with the patient or authorized representative who has indicated his/her understanding and acceptance.     Dental advisory given  Plan Discussed with: CRNA and Anesthesiologist  Anesthesia Plan  Comments:        Anesthesia Quick Evaluation

## 2021-07-09 ENCOUNTER — Encounter (HOSPITAL_COMMUNITY): Payer: Self-pay | Admitting: Orthopaedic Surgery

## 2021-07-09 ENCOUNTER — Other Ambulatory Visit: Payer: Self-pay

## 2021-07-09 ENCOUNTER — Ambulatory Visit (HOSPITAL_BASED_OUTPATIENT_CLINIC_OR_DEPARTMENT_OTHER): Payer: Medicare Other

## 2021-07-09 ENCOUNTER — Observation Stay (HOSPITAL_COMMUNITY)
Admission: RE | Admit: 2021-07-09 | Discharge: 2021-07-10 | Disposition: A | Discharge status: Home Health | Payer: Medicare Other | Attending: Orthopaedic Surgery | Admitting: Orthopaedic Surgery

## 2021-07-09 ENCOUNTER — Ambulatory Visit (HOSPITAL_COMMUNITY): Payer: Medicare Other

## 2021-07-09 ENCOUNTER — Encounter (HOSPITAL_COMMUNITY)
Admission: RE | Disposition: A | Discharge status: Home Health | Payer: Self-pay | Source: Home / Self Care | Attending: Orthopaedic Surgery

## 2021-07-09 ENCOUNTER — Observation Stay (HOSPITAL_COMMUNITY): Payer: Medicare Other

## 2021-07-09 DIAGNOSIS — R2689 Other abnormalities of gait and mobility: Secondary | ICD-10-CM | POA: Insufficient documentation

## 2021-07-09 DIAGNOSIS — Z96659 Presence of unspecified artificial knee joint: Secondary | ICD-10-CM

## 2021-07-09 DIAGNOSIS — Z79899 Other long term (current) drug therapy: Secondary | ICD-10-CM | POA: Insufficient documentation

## 2021-07-09 DIAGNOSIS — Z87891 Personal history of nicotine dependence: Secondary | ICD-10-CM | POA: Insufficient documentation

## 2021-07-09 DIAGNOSIS — D62 Acute posthemorrhagic anemia: Secondary | ICD-10-CM | POA: Insufficient documentation

## 2021-07-09 DIAGNOSIS — F419 Anxiety disorder, unspecified: Secondary | ICD-10-CM | POA: Insufficient documentation

## 2021-07-09 DIAGNOSIS — I10 Essential (primary) hypertension: Secondary | ICD-10-CM | POA: Diagnosis not present

## 2021-07-09 DIAGNOSIS — Z7982 Long term (current) use of aspirin: Secondary | ICD-10-CM | POA: Diagnosis not present

## 2021-07-09 DIAGNOSIS — K219 Gastro-esophageal reflux disease without esophagitis: Secondary | ICD-10-CM | POA: Insufficient documentation

## 2021-07-09 DIAGNOSIS — M1712 Unilateral primary osteoarthritis, left knee: Secondary | ICD-10-CM

## 2021-07-09 DIAGNOSIS — M179 Osteoarthritis of knee, unspecified: Secondary | ICD-10-CM | POA: Diagnosis present

## 2021-07-09 DIAGNOSIS — M25562 Pain in left knee: Secondary | ICD-10-CM | POA: Diagnosis not present

## 2021-07-09 DIAGNOSIS — Z96652 Presence of left artificial knee joint: Secondary | ICD-10-CM

## 2021-07-09 HISTORY — PX: TOTAL KNEE ARTHROPLASTY: SHX125

## 2021-07-09 HISTORY — DX: Presence of unspecified artificial knee joint: Z96.659

## 2021-07-09 SURGERY — ARTHROPLASTY, KNEE, TOTAL
Anesthesia: Spinal | Site: Knee | Laterality: Left

## 2021-07-09 MED ORDER — CHLORHEXIDINE GLUCONATE 0.12 % MT SOLN
OROMUCOSAL | Status: AC
  Administered 2021-07-09: 15 mL via OROMUCOSAL
  Filled 2021-07-09: qty 15

## 2021-07-09 MED ORDER — DEXAMETHASONE SODIUM PHOSPHATE 10 MG/ML IJ SOLN
10.0000 mg | Freq: Once | INTRAMUSCULAR | Status: AC
  Administered 2021-07-10: 10 mg via INTRAVENOUS
  Filled 2021-07-09: qty 1

## 2021-07-09 MED ORDER — ALPRAZOLAM 0.5 MG PO TABS: 0.5000 mg | ORAL_TABLET | Freq: Three times a day (TID) | ORAL | Status: DC | PRN

## 2021-07-09 MED ORDER — MENTHOL 3 MG MT LOZG: 1.0000 | LOZENGE | OROMUCOSAL | Status: DC | PRN

## 2021-07-09 MED ORDER — KETOROLAC TROMETHAMINE 15 MG/ML IJ SOLN
15.0000 mg | Freq: Four times a day (QID) | INTRAMUSCULAR | Status: AC
  Administered 2021-07-09 – 2021-07-10 (×4): 15 mg via INTRAVENOUS
  Filled 2021-07-09 (×4): qty 1

## 2021-07-09 MED ORDER — 0.9 % SODIUM CHLORIDE (POUR BTL) OPTIME
TOPICAL | Status: DC | PRN
  Administered 2021-07-09: 1000 mL

## 2021-07-09 MED ORDER — LISINOPRIL 5 MG PO TABS
5.0000 mg | ORAL_TABLET | Freq: Every day | ORAL | Status: DC
  Administered 2021-07-09 – 2021-07-10 (×2): 5 mg via ORAL
  Filled 2021-07-09 (×2): qty 1

## 2021-07-09 MED ORDER — GABAPENTIN 400 MG PO CAPS
400.0000 mg | ORAL_CAPSULE | Freq: Three times a day (TID) | ORAL | Status: DC
  Administered 2021-07-09 – 2021-07-10 (×3): 400 mg via ORAL
  Filled 2021-07-09 (×3): qty 1

## 2021-07-09 MED ORDER — POVIDONE-IODINE 10 % EX SWAB: 2.0000 | Freq: Once | CUTANEOUS | Status: DC

## 2021-07-09 MED ORDER — BUPIVACAINE IN DEXTROSE 0.75-8.25 % IT SOLN
INTRATHECAL | Status: DC | PRN
  Administered 2021-07-09: 2 mL via INTRATHECAL

## 2021-07-09 MED ORDER — PROPOFOL 10 MG/ML IV BOLUS
INTRAVENOUS | Status: AC
  Filled 2021-07-09: qty 20

## 2021-07-09 MED ORDER — PHENYLEPHRINE HCL-NACL 20-0.9 MG/250ML-% IV SOLN
INTRAVENOUS | Status: DC | PRN
  Administered 2021-07-09: 30 ug/min via INTRAVENOUS

## 2021-07-09 MED ORDER — FENTANYL CITRATE (PF) 250 MCG/5ML IJ SOLN
INTRAMUSCULAR | Status: AC
  Filled 2021-07-09: qty 5

## 2021-07-09 MED ORDER — ACETAMINOPHEN 500 MG PO TABS
1000.0000 mg | ORAL_TABLET | Freq: Once | ORAL | Status: AC
  Administered 2021-07-09: 1000 mg via ORAL

## 2021-07-09 MED ORDER — METOCLOPRAMIDE HCL 5 MG/ML IJ SOLN: 5.0000 mg | Freq: Three times a day (TID) | INTRAMUSCULAR | Status: DC | PRN

## 2021-07-09 MED ORDER — CEFAZOLIN SODIUM-DEXTROSE 2-4 GM/100ML-% IV SOLN
2.0000 g | INTRAVENOUS | Status: AC
  Administered 2021-07-09: 2 g via INTRAVENOUS
  Filled 2021-07-09: qty 100

## 2021-07-09 MED ORDER — OXYCODONE HCL 5 MG PO TABS
10.0000 mg | ORAL_TABLET | ORAL | Status: DC | PRN
  Administered 2021-07-09 – 2021-07-10 (×5): 10 mg via ORAL
  Filled 2021-07-09 (×2): qty 2

## 2021-07-09 MED ORDER — METHOCARBAMOL 500 MG PO TABS
500.0000 mg | ORAL_TABLET | Freq: Four times a day (QID) | ORAL | Status: DC | PRN
  Administered 2021-07-09: 500 mg via ORAL
  Filled 2021-07-09: qty 1

## 2021-07-09 MED ORDER — ONDANSETRON HCL 4 MG/2ML IJ SOLN: 4.0000 mg | Freq: Once | INTRAMUSCULAR | Status: DC | PRN

## 2021-07-09 MED ORDER — HYDROMORPHONE HCL 1 MG/ML IJ SOLN: 0.5000 mg | INTRAMUSCULAR | Status: DC | PRN

## 2021-07-09 MED ORDER — ACETAMINOPHEN 325 MG PO TABS: 325.0000 mg | ORAL_TABLET | Freq: Four times a day (QID) | ORAL | Status: DC | PRN

## 2021-07-09 MED ORDER — ONDANSETRON HCL 4 MG/2ML IJ SOLN: 4.0000 mg | Freq: Four times a day (QID) | INTRAMUSCULAR | Status: DC | PRN

## 2021-07-09 MED ORDER — SODIUM CHLORIDE 0.9 % IV SOLN: INTRAVENOUS | Status: DC

## 2021-07-09 MED ORDER — CHLORHEXIDINE GLUCONATE 0.12 % MT SOLN: 15.0000 mL | Freq: Once | OROMUCOSAL | Status: AC

## 2021-07-09 MED ORDER — METOCLOPRAMIDE HCL 5 MG PO TABS: 5.0000 mg | ORAL_TABLET | Freq: Three times a day (TID) | ORAL | Status: DC | PRN

## 2021-07-09 MED ORDER — TRANEXAMIC ACID-NACL 1000-0.7 MG/100ML-% IV SOLN
1000.0000 mg | Freq: Once | INTRAVENOUS | Status: AC
  Administered 2021-07-09: 1000 mg via INTRAVENOUS
  Filled 2021-07-09: qty 100

## 2021-07-09 MED ORDER — OXYCODONE HCL ER 10 MG PO T12A
10.0000 mg | EXTENDED_RELEASE_TABLET | Freq: Two times a day (BID) | ORAL | Status: DC
  Administered 2021-07-09 (×2): 10 mg via ORAL
  Filled 2021-07-09 (×2): qty 1

## 2021-07-09 MED ORDER — ACETAMINOPHEN 500 MG PO TABS
ORAL_TABLET | ORAL | Status: AC
  Filled 2021-07-09: qty 2

## 2021-07-09 MED ORDER — EPHEDRINE 5 MG/ML INJ
INTRAVENOUS | Status: AC
  Filled 2021-07-09: qty 5

## 2021-07-09 MED ORDER — MIDAZOLAM HCL 2 MG/2ML IJ SOLN
INTRAMUSCULAR | Status: AC
  Filled 2021-07-09: qty 2

## 2021-07-09 MED ORDER — PROPOFOL 10 MG/ML IV BOLUS
INTRAVENOUS | Status: DC | PRN
  Administered 2021-07-09: 20 mg via INTRAVENOUS

## 2021-07-09 MED ORDER — OXYCODONE HCL 5 MG/5ML PO SOLN: 5.0000 mg | Freq: Once | ORAL | Status: DC | PRN

## 2021-07-09 MED ORDER — SODIUM CHLORIDE 0.9 % IR SOLN
Status: DC | PRN
  Administered 2021-07-09: 1000 mL

## 2021-07-09 MED ORDER — TRANEXAMIC ACID 1000 MG/10ML IV SOLN
INTRAVENOUS | Status: DC | PRN
  Administered 2021-07-09: 2000 mg via TOPICAL

## 2021-07-09 MED ORDER — LACTATED RINGERS IV SOLN: INTRAVENOUS | Status: DC

## 2021-07-09 MED ORDER — ACETAMINOPHEN 500 MG PO TABS
1000.0000 mg | ORAL_TABLET | Freq: Four times a day (QID) | ORAL | Status: AC
  Administered 2021-07-09 – 2021-07-10 (×4): 1000 mg via ORAL
  Filled 2021-07-09 (×4): qty 2

## 2021-07-09 MED ORDER — ONDANSETRON HCL 4 MG/2ML IJ SOLN
INTRAMUSCULAR | Status: AC
Start: 2021-07-09 — End: ?
  Filled 2021-07-09: qty 2

## 2021-07-09 MED ORDER — ORAL CARE MOUTH RINSE: 15.0000 mL | Freq: Once | OROMUCOSAL | Status: AC

## 2021-07-09 MED ORDER — METHOCARBAMOL 1000 MG/10ML IJ SOLN
500.0000 mg | Freq: Four times a day (QID) | INTRAVENOUS | Status: DC | PRN
  Filled 2021-07-09: qty 5

## 2021-07-09 MED ORDER — TRANEXAMIC ACID-NACL 1000-0.7 MG/100ML-% IV SOLN
1000.0000 mg | INTRAVENOUS | Status: AC
  Administered 2021-07-09: 1000 mg via INTRAVENOUS
  Filled 2021-07-09: qty 100

## 2021-07-09 MED ORDER — ROCURONIUM BROMIDE 10 MG/ML (PF) SYRINGE
PREFILLED_SYRINGE | INTRAVENOUS | Status: AC
  Filled 2021-07-09: qty 10

## 2021-07-09 MED ORDER — HYDROMORPHONE HCL 1 MG/ML IJ SOLN: 0.2500 mg | INTRAMUSCULAR | Status: DC | PRN

## 2021-07-09 MED ORDER — BUPIVACAINE-MELOXICAM ER 400-12 MG/14ML IJ SOLN
INTRAMUSCULAR | Status: AC
  Filled 2021-07-09: qty 1

## 2021-07-09 MED ORDER — OXYCODONE HCL 5 MG PO TABS: 5.0000 mg | ORAL_TABLET | Freq: Once | ORAL | Status: DC | PRN

## 2021-07-09 MED ORDER — SUCCINYLCHOLINE CHLORIDE 200 MG/10ML IV SOSY
PREFILLED_SYRINGE | INTRAVENOUS | Status: AC
Start: 2021-07-09 — End: ?
  Filled 2021-07-09: qty 10

## 2021-07-09 MED ORDER — BUPIVACAINE-EPINEPHRINE (PF) 0.5% -1:200000 IJ SOLN
INTRAMUSCULAR | Status: DC | PRN
  Administered 2021-07-09: 30 mL via PERINEURAL

## 2021-07-09 MED ORDER — DOCUSATE SODIUM 100 MG PO CAPS
100.0000 mg | ORAL_CAPSULE | Freq: Two times a day (BID) | ORAL | Status: DC
Start: 2021-07-09 — End: 2021-07-10
  Administered 2021-07-09: 100 mg via ORAL
  Filled 2021-07-09 (×2): qty 1

## 2021-07-09 MED ORDER — PROPOFOL 500 MG/50ML IV EMUL
INTRAVENOUS | Status: DC | PRN
  Administered 2021-07-09: 50 ug/kg/min via INTRAVENOUS

## 2021-07-09 MED ORDER — FERROUS SULFATE 325 (65 FE) MG PO TABS
325.0000 mg | ORAL_TABLET | Freq: Three times a day (TID) | ORAL | Status: DC
  Administered 2021-07-09 – 2021-07-10 (×3): 325 mg via ORAL
  Filled 2021-07-09 (×3): qty 1

## 2021-07-09 MED ORDER — VANCOMYCIN HCL 1000 MG IV SOLR
INTRAVENOUS | Status: AC
  Filled 2021-07-09: qty 20

## 2021-07-09 MED ORDER — FENTANYL CITRATE (PF) 250 MCG/5ML IJ SOLN
INTRAMUSCULAR | Status: DC | PRN
  Administered 2021-07-09: 100 ug via INTRAVENOUS

## 2021-07-09 MED ORDER — LIDOCAINE 2% (20 MG/ML) 5 ML SYRINGE
INTRAMUSCULAR | Status: AC
Start: 2021-07-09 — End: ?
  Filled 2021-07-09: qty 5

## 2021-07-09 MED ORDER — MIDAZOLAM HCL 2 MG/2ML IJ SOLN
INTRAMUSCULAR | Status: DC | PRN
  Administered 2021-07-09: 2 mg via INTRAVENOUS

## 2021-07-09 MED ORDER — CEFAZOLIN SODIUM-DEXTROSE 2-4 GM/100ML-% IV SOLN
2.0000 g | Freq: Four times a day (QID) | INTRAVENOUS | Status: AC
  Administered 2021-07-09 (×2): 2 g via INTRAVENOUS
  Filled 2021-07-09 (×2): qty 100

## 2021-07-09 MED ORDER — VANCOMYCIN HCL 1000 MG IV SOLR
INTRAVENOUS | Status: DC | PRN
  Administered 2021-07-09: 1000 mg

## 2021-07-09 MED ORDER — EPHEDRINE SULFATE-NACL 50-0.9 MG/10ML-% IV SOSY
PREFILLED_SYRINGE | INTRAVENOUS | Status: DC | PRN
  Administered 2021-07-09: 5 mg via INTRAVENOUS

## 2021-07-09 MED ORDER — OXYCODONE HCL 5 MG PO TABS
5.0000 mg | ORAL_TABLET | ORAL | Status: DC | PRN
  Administered 2021-07-10: 5 mg via ORAL
  Filled 2021-07-09 (×4): qty 2

## 2021-07-09 MED ORDER — ASPIRIN 81 MG PO CHEW
81.0000 mg | CHEWABLE_TABLET | Freq: Two times a day (BID) | ORAL | Status: DC
  Administered 2021-07-09 – 2021-07-10 (×2): 81 mg via ORAL
  Filled 2021-07-09 (×2): qty 1

## 2021-07-09 MED ORDER — PHENOL 1.4 % MT LIQD: 1.0000 | OROMUCOSAL | Status: DC | PRN

## 2021-07-09 MED ORDER — DEXAMETHASONE SODIUM PHOSPHATE 10 MG/ML IJ SOLN
INTRAMUSCULAR | Status: AC
  Filled 2021-07-09: qty 1

## 2021-07-09 MED ORDER — ONDANSETRON HCL 4 MG PO TABS: 4.0000 mg | ORAL_TABLET | Freq: Four times a day (QID) | ORAL | Status: DC | PRN

## 2021-07-09 MED ORDER — ONDANSETRON HCL 4 MG/2ML IJ SOLN
INTRAMUSCULAR | Status: DC | PRN
  Administered 2021-07-09: 4 mg via INTRAVENOUS

## 2021-07-09 MED ORDER — BUPIVACAINE-MELOXICAM ER 400-12 MG/14ML IJ SOLN
INTRAMUSCULAR | Status: DC | PRN
  Administered 2021-07-09: 400 mg

## 2021-07-09 SURGICAL SUPPLY — 84 items
ALCOHOL 70% 16 OZ (MISCELLANEOUS) ×2 IMPLANT
BAG COUNTER SPONGE SURGICOUNT (BAG) IMPLANT
BAG DECANTER FOR FLEXI CONT (MISCELLANEOUS) ×2 IMPLANT
BANDAGE ESMARK 6X9 LF (GAUZE/BANDAGES/DRESSINGS) IMPLANT
BLADE SAG 18X100X1.27 (BLADE) ×2 IMPLANT
BNDG ESMARK 6X9 LF (GAUZE/BANDAGES/DRESSINGS)
BOWL SMART MIX CTS (DISPOSABLE) ×1 IMPLANT
CLSR STERI-STRIP ANTIMIC 1/2X4 (GAUZE/BANDAGES/DRESSINGS) ×4 IMPLANT
COMP FEM KNEE STD PS 7 LT (Joint) ×2 IMPLANT
COMP TIB KNEE PS 0D LT (Joint) ×2 IMPLANT
COMPONENT FEM KNEE STD PS 7 LT (Joint) IMPLANT
COMPONENT TIB KNEE PS 0D LT (Joint) IMPLANT
COOLER ICEMAN CLASSIC (MISCELLANEOUS) ×3 IMPLANT
COVER SURGICAL LIGHT HANDLE (MISCELLANEOUS) ×2 IMPLANT
CUFF TOURN SGL QUICK 34 (TOURNIQUET CUFF) ×2
CUFF TOURN SGL QUICK 42 (TOURNIQUET CUFF) IMPLANT
CUFF TRNQT CYL 34X4.125X (TOURNIQUET CUFF) ×1 IMPLANT
DERMABOND ADVANCED (GAUZE/BANDAGES/DRESSINGS) ×1
DERMABOND ADVANCED .7 DNX12 (GAUZE/BANDAGES/DRESSINGS) ×1 IMPLANT
DRAPE EXTREMITY T 121X128X90 (DISPOSABLE) ×2 IMPLANT
DRAPE HALF SHEET 40X57 (DRAPES) ×2 IMPLANT
DRAPE INCISE IOBAN 66X45 STRL (DRAPES) ×2 IMPLANT
DRAPE ORTHO SPLIT 77X108 STRL (DRAPES) ×4
DRAPE POUCH INSTRU U-SHP 10X18 (DRAPES) ×2 IMPLANT
DRAPE SURG ORHT 6 SPLT 77X108 (DRAPES) ×2 IMPLANT
DRAPE U-SHAPE 47X51 STRL (DRAPES) ×4 IMPLANT
DRESSING AQUACEL AG SP 3.5X10 (GAUZE/BANDAGES/DRESSINGS) IMPLANT
DRSG AQUACEL AG ADV 3.5X10 (GAUZE/BANDAGES/DRESSINGS) ×2 IMPLANT
DRSG AQUACEL AG ADV 3.5X14 (GAUZE/BANDAGES/DRESSINGS) ×1 IMPLANT
DRSG AQUACEL AG SP 3.5X10 (GAUZE/BANDAGES/DRESSINGS) ×2
DURAPREP 26ML APPLICATOR (WOUND CARE) ×6 IMPLANT
ELECT CAUTERY BLADE 6.4 (BLADE) ×2 IMPLANT
ELECT REM PT RETURN 9FT ADLT (ELECTROSURGICAL) ×2
ELECTRODE REM PT RTRN 9FT ADLT (ELECTROSURGICAL) ×1 IMPLANT
GLOVE BIOGEL PI IND STRL 7.0 (GLOVE) ×1 IMPLANT
GLOVE BIOGEL PI IND STRL 7.5 (GLOVE) ×4 IMPLANT
GLOVE BIOGEL PI INDICATOR 7.0 (GLOVE) ×1
GLOVE BIOGEL PI INDICATOR 7.5 (GLOVE) ×4
GLOVE ECLIPSE 7.0 STRL STRAW (GLOVE) ×6 IMPLANT
GLOVE SKINSENSE NS SZ7.5 (GLOVE) ×3
GLOVE SKINSENSE STRL SZ7.5 (GLOVE) ×3 IMPLANT
GLOVE SURG UNDER LTX SZ7.5 (GLOVE) ×4 IMPLANT
GLOVE SURG UNDER POLY LF SZ7 (GLOVE) ×4 IMPLANT
GOWN STRL REIN XL XLG (GOWN DISPOSABLE) ×4 IMPLANT
GOWN STRL REUS W/ TWL LRG LVL3 (GOWN DISPOSABLE) ×1 IMPLANT
GOWN STRL REUS W/TWL LRG LVL3 (GOWN DISPOSABLE) ×2
HANDPIECE INTERPULSE COAX TIP (DISPOSABLE) ×2
HDLS TROCR DRIL PIN KNEE 75 (PIN) ×2
HOOD PEEL AWAY FLYTE STAYCOOL (MISCELLANEOUS) ×5 IMPLANT
INSERT FIXED AS SZ 6-7 13 LT (Insert) ×1 IMPLANT
JET LAVAGE IRRISEPT WOUND (IRRIGATION / IRRIGATOR)
KIT BASIN OR (CUSTOM PROCEDURE TRAY) ×2 IMPLANT
KIT TURNOVER KIT B (KITS) ×2 IMPLANT
LAVAGE JET IRRISEPT WOUND (IRRIGATION / IRRIGATOR) ×1 IMPLANT
MANIFOLD NEPTUNE II (INSTRUMENTS) ×2 IMPLANT
MARKER SKIN DUAL TIP RULER LAB (MISCELLANEOUS) ×4 IMPLANT
NDL SPNL 18GX3.5 QUINCKE PK (NEEDLE) ×1 IMPLANT
NEEDLE SPNL 18GX3.5 QUINCKE PK (NEEDLE) ×2 IMPLANT
NS IRRIG 1000ML POUR BTL (IV SOLUTION) ×2 IMPLANT
PACK TOTAL JOINT (CUSTOM PROCEDURE TRAY) ×2 IMPLANT
PAD ARMBOARD 7.5X6 YLW CONV (MISCELLANEOUS) ×4 IMPLANT
PAD COLD SHLDR WRAP-ON (PAD) ×3 IMPLANT
PATELLA STD SZ 38X10 (Miscellaneous) ×1 IMPLANT
PIN DRILL HDLS TROCAR 75 4PK (PIN) IMPLANT
SAW OSC TIP CART 19.5X105X1.3 (SAW) ×2 IMPLANT
SCREW FEMALE HEX FIX 25X2.5 (ORTHOPEDIC DISPOSABLE SUPPLIES) ×1 IMPLANT
SET HNDPC FAN SPRY TIP SCT (DISPOSABLE) ×1 IMPLANT
SOLUTION PRONTOSAN WOUND 350ML (IRRIGATION / IRRIGATOR) ×1 IMPLANT
STAPLER VISISTAT 35W (STAPLE) IMPLANT
SUCTION FRAZIER HANDLE 10FR (MISCELLANEOUS) ×2
SUCTION TUBE FRAZIER 10FR DISP (MISCELLANEOUS) ×1 IMPLANT
SUT ETHILON 2 0 FS 18 (SUTURE) IMPLANT
SUT MNCRL AB 3-0 PS2 27 (SUTURE) ×1 IMPLANT
SUT VIC AB 0 CT1 27 (SUTURE) ×4
SUT VIC AB 0 CT1 27XBRD ANBCTR (SUTURE) ×2 IMPLANT
SUT VIC AB 1 CTX 27 (SUTURE) ×5 IMPLANT
SUT VIC AB 2-0 CT1 27 (SUTURE) ×8
SUT VIC AB 2-0 CT1 TAPERPNT 27 (SUTURE) ×4 IMPLANT
SYR 50ML LL SCALE MARK (SYRINGE) ×4 IMPLANT
TOWEL GREEN STERILE (TOWEL DISPOSABLE) ×2 IMPLANT
TOWEL GREEN STERILE FF (TOWEL DISPOSABLE) ×2 IMPLANT
TRAY CATH 16FR W/PLASTIC CATH (SET/KITS/TRAYS/PACK) IMPLANT
UNDERPAD 30X36 HEAVY ABSORB (UNDERPADS AND DIAPERS) ×2 IMPLANT
YANKAUER SUCT BULB TIP NO VENT (SUCTIONS) ×4 IMPLANT

## 2021-07-09 NOTE — Op Note (Signed)
Total Knee Arthroplasty Procedure Note  Preoperative diagnosis: Left knee osteoarthritis  Postoperative diagnosis:same  Operative procedure: Left total knee arthroplasty. CPT [phone]  Surgeon: N. Eduard Roux, MD  Assist: Madalyn Rob, PA-C; necessary for the timely completion of procedure and due to complexity of procedure.  Anesthesia: Spinal, regional  Tourniquet time: see anesthesia record  Implants used: Zimmer persona pressfit Femur: CR 7 Tibia: F Patella: 38 mm Polyethylene: 13 mm, MC  Indication: Gregory Drown Sr. is a 56 y.o. year old male with a history of knee pain. Having failed conservative management, the patient elected to proceed with a total knee arthroplasty.  We have reviewed the risk and benefits of the surgery and they elected to proceed after voicing understanding.  Procedure:  After informed consent was obtained and understanding of the risk were voiced including but not limited to bleeding, infection, damage to surrounding structures including nerves and vessels, blood clots, leg length inequality and the failure to achieve desired results, the operative extremity was marked with verbal confirmation of the patient in the holding area.   The patient was then brought to the operating room and transported to the operating room table in the supine position.  A tourniquet was applied to the operative extremity around the upper thigh. The operative limb was then prepped and draped in the usual sterile fashion and preoperative antibiotics were administered.  A time out was performed prior to the start of surgery confirming the correct extremity, preoperative antibiotic administration, as well as team members, implants and instruments available for the case. Correct surgical site was also confirmed with preoperative radiographs. The limb was then elevated for exsanguination and the tourniquet was inflated. A midline incision was made and a standard medial  parapatellar approach was performed.  The patella was prepared and sized to a 38 mm.  A cover was placed on the patella for protection from retractors.  We then turned our attention to the femur.  Femoral condyles were denuded of cartilage consistent with advanced DJD.  Posterior cruciate ligament was sacrificed. Start site was drilled in the femur and the intramedullary distal femoral cutting guide was placed, set at 5 degrees valgus, taking 10 mm of distal resection. The distal cut was made. Osteophytes were then removed.   Next, the proximal tibial cutting guide was placed with appropriate slope, varus/valgus alignment and depth of resection. The proximal tibial cut was made taking 4 mm off the low side. Gap blocks were then used to assess the extension gap and alignment, and appropriate soft tissue releases were performed. Attention was turned back to the femur, which was sized using the sizing guide to a size 7. Appropriate rotation of the femoral component was determined using epicondylar axis, Whiteside's line, and assessing the flexion gap under ligament tension. The appropriate size 4-in-1 cutting block was placed and cuts were made.  Posterior femoral osteophytes and uncapped bone were then removed with the curved osteotome.  Trial components were placed, and stability was checked in full extension, mid-flexion, and deep flexion. Proper tibial rotation was determined and marked.  The patella tracked well without a lateral release. Trial components were then removed and tibial preparation performed.  The tibia was sized for a size F component.  The bony surfaces were irrigated with a pulse lavage and then dried. Final components were placed.  The stability of the construct was re-evaluated throughout a range of motion and found to be acceptable. The trial liner was removed, the knee was  copiously irrigated, and the knee was re-evaluated for any excess bone debris. The real polyethylene liner, 13 mm  thick, was inserted and checked to ensure the locking mechanism had engaged appropriately. The tourniquet was deflated and hemostasis was achieved. The wound was irrigated with normal saline.  One gram of vancomycin powder was placed in the surgical bed.  Topical 0.25% bupivacaine and meloxicam was placed in the joint for postoperative pain.  Capsular closure was performed with a #1 vicryl, subcutaneous fat closed with a 0 vicryl suture, then subcutaneous tissue closed with interrupted 2.0 vicryl suture. The skin was then closed with a 3.0 monocryl and steri strips. A sterile dressing was applied.  The patient was awakened in the operating room and taken to recovery in stable condition. All sponge, needle, and instrument counts were correct at the end of the case.  [name] was necessary for opening, closing, retracting, limb positioning and overall facilitation and completion of the surgery.  Position: supine  Complications: none.  Time Out: performed   Drains/Packing: none  Estimated blood loss: minimal  Returned to Recovery Room: in good condition.   Antibiotics: yes   Mechanical VTE (DVT) Prophylaxis: sequential compression devices, TED thigh-high  Chemical VTE (DVT) Prophylaxis: aspirin  Fluid Replacement  Crystalloid: see anesthesia record Blood: none  FFP: none   Specimens Removed: 1 to pathology   Sponge and Instrument Count Correct? yes   PACU: portable radiograph - knee AP and Lateral   Plan/RTC: Return in 2 weeks for wound check.   Weight Bearing/Load Lower Extremity: full   Implant Name Type Inv. Item Serial No. Manufacturer Lot No. LRB No. Used Action  COMP TIB KNEE PS 0D LT - [id number] Joint COMP TIB KNEE PS 0D LT  ZIMMER RECON(ORTH,TRAU,BIO,SG) [id number] Left 1 Implanted  INSERT FIXED AS SZ 6-7 13 LT - [id number] Insert INSERT FIXED AS SZ 6-7 13 LT  ZIMMER [employer]ON(ORTH,TRAU,BIO,SG) [id number] Left 1 Implanted  COMP FEM KNEE STD PS 7 LT - [id number] Joint COMP FEM  KNEE STD PS 7 LT  ZIMMER [employer]ON(ORTH,TRAU,BIO,SG) [id number] Left 1 Implanted  PATELLA STD SZ 38X10 - [id number] Miscellaneous PATELLA STD SZ 38X10  [employer]IMMER [employer]ON(ORTH,TRAU,BIO,SG) [id number] Left 1 Implanted    N. Eduard Roux, MD [facility] 8:49 AM

## 2021-07-09 NOTE — Plan of Care (Signed)

## 2021-07-09 NOTE — Discharge Instructions (Signed)

## 2021-07-09 NOTE — Plan of Care (Signed)

## 2021-07-09 NOTE — H&P (Signed)
PREOPERATIVE H&P  Chief Complaint: left knee degenerative joint disease  HPI: Gregory BJELLAND Sr. is a 56 y.o. male who presents for surgical treatment of left knee degenerative joint disease.  He denies any changes in medical history.  Past Medical History:  Diagnosis Date   Anxiety    Bronchitis    GERD (gastroesophageal reflux disease)    Hypertension    Insomnia    Low testosterone    Osteoarthritis    Past Surgical History:  Procedure Laterality Date   COLONOSCOPY WITH PROPOFOL N/A 01/24/2021   Procedure: COLONOSCOPY WITH PROPOFOL;  Surgeon: Lin Landsman, MD;  Location: [facility] ENDOSCOPY;  Service: Gastroenterology;  Laterality: N/A;   NASAL SINUS SURGERY     Dr. Scheryl Marten     WRIST SURGERY Right    Social History   Socioeconomic History   Marital status: Married    Spouse name: Not on file   Number of children: 1   Years of education: Not on file   Highest education level: Not on file  Occupational History   Not on file  Tobacco Use   Smoking status: Former    Types: Cigarettes    Quit date: 2010    Years since quitting: 13.5   Smokeless tobacco: Never   Tobacco comments:    smoked age [age] stopped then age 78 x 1 years max 1/2 ppd   Vaping Use   Vaping Use: Never used  Substance and Sexual Activity   Alcohol use: No    Alcohol/week: 0.0 standard drinks of alcohol   Drug use: No   Sexual activity: Yes  Other Topics Concern   Not on file  Social History Narrative   Lives in [place] with wife      Work - Designer, [employer]   Social Determinants of Health   Financial Resource Strain: Low Risk  (12/22/2020)   Overall Financial Resource Strain (CARDIA)    Difficulty of Paying Living Expenses: Not hard at all  Food Insecurity: No Food Insecurity (12/22/2020)   Hunger Vital Sign    Worried About Running Out of Food in the Last Year: Never true    [place] in the Last Year: Never true  Transportation Needs: No Transportation Needs  (12/22/2020)   PRAPARE - Hydrologist (Medical): No    Lack of Transportation (Non-Medical): No  Physical Activity: Insufficiently Active (12/22/2020)   Exercise Vital Sign    Days of Exercise per Week: 3 days    Minutes of Exercise per Session: 30 min  Stress: No Stress Concern Present (12/22/2020)   [name]    Feeling of Stress : Not at all  Social Connections: Unknown (12/22/2020)   Social Connection and Isolation Panel [NHANES]    Frequency of Communication with Friends and Family: Not on file    Frequency of Social Gatherings with Friends and Family: Not on file    Attends Religious Services: Not on file    Active Member of Clubs or Organizations: Not on file    Attends Archivist Meetings: Not on file    Marital Status: Married   Family History  Problem Relation Age of Onset   Lung cancer Father        deceased   Parkinson's disease Mother    Allergies  Allergen Reactions   Tessalon Perles [Benzonatate]     drowsy   Prior to  Admission medications   Medication Sig Start Date End Date Taking? Authorizing Provider  albuterol (PROVENTIL HFA;VENTOLIN HFA) 108 (90 Base) MCG/ACT inhaler Inhale 1-2 puffs into the lungs every 6 (six) hours as needed for wheezing or shortness of breath. 02/27/17  Yes McLean-Scocuzza, Nino Glow, MD  gabapentin (NEURONTIN) 400 MG capsule TAKE 2 CAPSULES(800 MG) BY MOUTH THREE TIMES DAILY 03/26/21  Yes Leone Haven, MD  lisinopril (ZESTRIL) 20 MG tablet TAKE 1 TABLET(20 MG) BY MOUTH DAILY Patient taking differently: Take 5 mg by mouth daily. 05/02/21  Yes Leone Haven, MD  Multiple Vitamin (MULTIVITAMIN) tablet Take 1 tablet by mouth daily.   Yes [provider]  omeprazole (PRILOSEC) 40 MG capsule TAKE ONE CAPSULE BY MOUTH DAILY 01/09/21  Yes Leone Haven, MD  testosterone cypionate (DEPOTESTOSTERONE CYPIONATE) 200 MG/ML  injection Inject 160 mg into the skin every 14 (fourteen) days. 07/24/18  Yes [provider]  ALPRAZolam (XANAX) 0.5 MG tablet TAKE 1 TABLET(0.5 MG) BY MOUTH THREE TIMES DAILY AS NEEDED FOR ANXIETY 06/29/21   Leone Haven, MD  aspirin EC 81 MG tablet Take 1 tablet (81 mg total) by mouth in the morning and at bedtime. To be taken after surgery to prevent blood clots.  Swallow whole. 07/08/21 07/08/22  Aundra Dubin, PA-C  docusate sodium (COLACE) 100 MG capsule Take 1 capsule (100 mg total) by mouth daily as needed. 07/08/21 07/08/22  Aundra Dubin, PA-C  ondansetron (ZOFRAN) 4 MG tablet Take 1 tablet (4 mg total) by mouth every 8 (eight) hours as needed for nausea or vomiting. 07/08/21   Aundra Dubin, PA-C  oxyCODONE-acetaminophen (PERCOCET) 5-325 MG tablet Take 1-2 tablets by mouth every 6 (six) hours as needed. To be taken after surgery 07/08/21   Aundra Dubin, PA-C  tiZANidine (ZANAFLEX) 4 MG tablet TAKE 1 TABLET(4 MG) BY MOUTH TWICE DAILY AS NEEDED FOR MUSCLE SPASMS 07/03/21   Leone Haven, MD     Positive ROS: All other systems have been reviewed and were otherwise negative with the exception of those mentioned in the HPI and as above.  Physical Exam: General: Alert, no acute distress Cardiovascular: No pedal edema Respiratory: No cyanosis, no use of accessory musculature GI: abdomen soft Skin: No lesions in the area of chief complaint Neurologic: Sensation intact distally Psychiatric: Patient is competent for consent with normal mood and affect Lymphatic: no lymphedema  MUSCULOSKELETAL: exam stable  Assessment: left knee degenerative joint disease  Plan: Plan for Procedure(s): LEFT TOTAL KNEE ARTHROPLASTY  The risks benefits and alternatives were discussed with the patient including but not limited to the risks of nonoperative treatment, versus surgical intervention including infection, bleeding, nerve injury,  blood clots, cardiopulmonary complications,  morbidity, mortality, among others, and they were willing to proceed.   Preoperative templating of the joint replacement has been completed, documented, and submitted to the Operating Room personnel in order to optimize intra-operative equipment management.   Eduard Roux, MD 07/09/2021 6:05 AM

## 2021-07-09 NOTE — Anesthesia Postprocedure Evaluation (Signed)
Anesthesia Post Note  Patient: Gregory LANGHANS Sr.  Procedure(s) Performed: LEFT TOTAL KNEE ARTHROPLASTY (Left: Knee)     Patient location during evaluation: PACU Anesthesia Type: Spinal Level of consciousness: oriented and awake and alert Pain management: pain level controlled Vital Signs Assessment: post-procedure vital signs reviewed and stable Respiratory status: spontaneous breathing, respiratory function stable and nonlabored ventilation Cardiovascular status: blood pressure returned to baseline and stable Postop Assessment: no headache, no backache, no apparent nausea or vomiting and spinal receding Anesthetic complications: no   No notable events documented.  Last Vitals:  Vitals:   07/09/21 1000 07/09/21 1015  BP: 119/75 122/89  Pulse: (!) 53 (!) 48  Resp: 15 14  Temp:    SpO2: 98% 99%    Last Pain:  Vitals:   07/09/21 1000  TempSrc:   PainSc: 0-No pain                 Oluwanifemi Petitti A.

## 2021-07-09 NOTE — Evaluation (Signed)
Physical Therapy Evaluation Patient Details Name: Gregory BELL Sr. MRN: [id number] DOB: [birth year] Today's Date: 07/09/2021  History of Present Illness  Pt is a 56 y.o. M who presents s/p L TKA 07/09/2021. Significant PMH: scoliosis.  Clinical Impression  Pt evaluated s/p L TKA. Overall he is mobilizing well POD #0. Initiated education regarding HEP, activity recommendations, cryotherapy and knee positioning. Pt ambulating 160 ft with a walker, progressing to step through pattern. Suspect good progress given age and PLOF. Will plan for further gait and stair training tomorrow.     Recommendations for follow up therapy are one component of a multi-disciplinary discharge planning process, led by the attending physician.  Recommendations may be updated based on patient status, additional functional criteria and insurance authorization.  Follow Up Recommendations Follow physician's recommendations for discharge plan and follow up therapies      Assistance Recommended at Discharge PRN  Patient can return home with the following  Assistance with cooking/housework;Assist for transportation;Help with stairs or ramp for entrance    Equipment Recommendations None recommended by PT (has needed DME)  Recommendations for Other Services       Functional Status Assessment Patient has had a recent decline in their functional status and demonstrates the ability to make significant improvements in function in a reasonable and predictable amount of time.     Precautions / Restrictions Precautions Precautions: Fall Restrictions Weight Bearing Restrictions: Yes LLE Weight Bearing: Weight bearing as tolerated      Mobility  Bed Mobility Overal bed mobility: Needs Assistance Bed Mobility: Supine to Sit     Supine to sit: Min guard     General bed mobility comments: Exiting towards left side of bed, min guard for safety    Transfers Overall transfer level: Needs assistance Equipment used:  Rolling walker (2 wheels) Transfers: Sit to/from Stand Sit to Stand: Min guard           General transfer comment: Min guard to power up from edge of bed    Ambulation/Gait Ambulation/Gait assistance: Min guard Gait Distance (Feet): 160 Feet Assistive device: Rolling walker (2 wheels) Gait Pattern/deviations: Step-to pattern, Step-through pattern, Decreased stance time - left, Decreased dorsiflexion - left, Decreased weight shift to left, Antalgic Gait velocity: decreased     General Gait Details: Cues for safe walker use, min guard for balance, decreased heel strike at initial contact  Stairs            Wheelchair Mobility    Modified Rankin (Stroke Patients Only)       Balance Overall balance assessment: Mild deficits observed, not formally tested                                           Pertinent Vitals/Pain Pain Assessment Pain Assessment: Faces Faces Pain Scale: Hurts little more Pain Location: L knee Pain Descriptors / Indicators: Operative site guarding, Grimacing Pain Intervention(s): Monitored during session, Limited activity within patient's tolerance, Ice applied    Home Living Family/patient expects to be discharged to:: Private residence Living Arrangements: Spouse/significant other   Type of Home: House Home Access: Ramped entrance     Alternate Level Stairs-Number of Steps: 16 Home Layout: Two level;Able to live on main level with bedroom/bathroom Home Equipment: Rolling Walker (2 wheels);Cane - single point Additional Comments: bathtub dowstairs walk in shower upstairs    Prior Function Prior Level of Function :  Independent/Modified Independent;Driving             Mobility Comments: Does not work       Journalist, newspaper        Extremity/Trunk Assessment   Upper Extremity Assessment Upper Extremity Assessment: Overall WFL for tasks assessed    Lower Extremity Assessment Lower Extremity Assessment: LLE  deficits/detail LLE Deficits / Details: s/p TKR, expected post op deficits    Cervical / Trunk Assessment Cervical / Trunk Assessment: Normal  Communication   Communication: No difficulties  Cognition Arousal/Alertness: Awake/alert Behavior During Therapy: WFL for tasks assessed/performed Overall Cognitive Status: Within Functional Limits for tasks assessed                                 General Comments: Mild impulsivity (likely due to meds)        General Comments      Exercises Other Exercises Other Exercises: Education provided on ankle pumps, heel slides, quad sets   Assessment/Plan    PT Assessment Patient needs continued PT services  PT Problem List Decreased strength;Decreased range of motion;Decreased balance;Decreased mobility;Pain       PT Treatment Interventions DME instruction;Gait training;Stair training;Functional mobility training;Therapeutic activities;Therapeutic exercise;Balance training;Patient/family education    PT Goals (Current goals can be found in the Care Plan section)  Acute Rehab PT Goals Patient Stated Goal: improve walking pattern, knee flexion PT Goal Formulation: With patient/family Time For Goal Achievement: 07/23/21 Potential to Achieve Goals: Good    Frequency 7X/week     Co-evaluation               AM-PAC PT "6 Clicks" Mobility  Outcome Measure Help needed turning from your back to your side while in a flat bed without using bedrails?: None Help needed moving from lying on your back to sitting on the side of a flat bed without using bedrails?: A Little Help needed moving to and from a bed to a chair (including a wheelchair)?: A Little Help needed standing up from a chair using your arms (e.g., wheelchair or bedside chair)?: A Little Help needed to walk in hospital room?: A Little Help needed climbing 3-5 steps with a railing? : A Little 6 Click Score: 19    End of Session Equipment Utilized During  Treatment: Gait belt Activity Tolerance: Patient tolerated treatment well Patient left: in chair;with call bell/phone within reach;with chair alarm set Nurse Communication: Mobility status PT Visit Diagnosis: Other abnormalities of gait and mobility (R26.89);Pain Pain - Right/Left: Left Pain - part of body: Knee    Time: 7782-4235 PT Time Calculation (min) (ACUTE ONLY): 25 min   Charges:   PT Evaluation $PT Eval Low Complexity: 1 Low PT Treatments $Gait Training: 8-22 mins        Wyona Almas, PT, DPT Acute Rehabilitation Services Office [phone]   Deno Etienne 07/09/2021, 3:05 PM

## 2021-07-09 NOTE — Anesthesia Procedure Notes (Signed)
Anesthesia Regional Block: Adductor canal block   Pre-Anesthetic Checklist: , timeout performed,  Correct Patient, Correct Site, Correct Laterality,  Correct Procedure, Correct Position, site marked,  Risks and benefits discussed,  Surgical consent,  Pre-op evaluation,  At surgeon's request and post-op pain management  Laterality: Left  Prep: chloraprep       Needles:  Injection technique: Single-shot  Needle Type: Echogenic Stimulator Needle     Needle Length: 10cm  Needle Gauge: 21   Needle insertion depth: 6 cm   Additional Needles:   Procedures:,,,, ultrasound used (permanent image in chart),,    Narrative:  Start time: 07/09/2021 7:15 AM End time: 07/09/2021 7:20 AM Injection made incrementally with aspirations every 5 mL.  Performed by: Personally  Anesthesiologist: Josephine Igo, MD  Additional Notes: Timeout performed. Patient sedated. Relevant anatomy ID'd using [place]. Incremental 2-57m injection of LA with frequent aspiration. Patient tolerated procedure well.     Left Adductor Canal Block

## 2021-07-09 NOTE — Transfer of Care (Signed)
Immediate Anesthesia Transfer of Care Note  Patient: Gregory Drown Sr.  Procedure(s) Performed: LEFT TOTAL KNEE ARTHROPLASTY (Left: Knee)  Patient Location: PACU  Anesthesia Type:Spinal and MAC combined with regional for post-op pain  Level of Consciousness: drowsy and patient cooperative  Airway & Oxygen Therapy: Patient Spontanous Breathing  Post-op Assessment: Report given to RN and Post -op Vital signs reviewed and stable  Post vital signs: Reviewed and stable  Last Vitals:  Vitals Value Taken Time  BP 102/69 07/09/21 0927  Temp    Pulse 67 07/09/21 0928  Resp 16 07/09/21 0928  SpO2 97 % 07/09/21 0928  Vitals shown include unvalidated device data.  Last Pain:  Vitals:   07/09/21 0610  TempSrc:   PainSc: 0-No pain         Complications: No notable events documented.

## 2021-07-10 DIAGNOSIS — M1712 Unilateral primary osteoarthritis, left knee: Secondary | ICD-10-CM | POA: Diagnosis not present

## 2021-07-10 LAB — CBC
HCT: 40.8 % (ref 39.0–52.0)
Hemoglobin: 14.1 g/dL (ref 13.0–17.0)
MCH: 31.5 pg (ref 26.0–34.0)
MCHC: 34.6 g/dL (ref 30.0–36.0)
MCV: 91.1 fL (ref 80.0–100.0)
Platelets: 169 10*3/uL (ref 150–400)
RBC: 4.48 MIL/uL (ref 4.22–5.81)
RDW: 13.2 % (ref 11.5–15.5)
WBC: 11.9 10*3/uL — ABNORMAL HIGH (ref 4.0–10.5)
nRBC: 0 % (ref 0.0–0.2)

## 2021-07-10 NOTE — Progress Notes (Signed)
   Subjective:  Patient reports pain as mild.   Did very well with PT yesterday.  Objective:   VITALS:   Vitals:   07/09/21 1322 07/09/21 1653 07/09/21 2017 07/10/21 0425  BP: (!) 149/90 (!) 172/93 119/74 (!) 157/103  Pulse: 60 65 67 61  Resp: '17 15 20 20  '$ Temp: 97.8 F (36.6 C) 97.9 F (36.6 C) 98.3 F (36.8 C) 98.3 F (36.8 C)  TempSrc: Oral Oral Oral   SpO2:  98% 99% 100%  Weight: 78.7 kg     Height: '5\' 11"'$  (1.803 m)       Sensation intact distally Intact pulses distally Dorsiflexion/Plantar flexion intact Incision: dressing C/D/I and no drainage   Lab Results  Component Value Date   WBC 11.9 (H) 07/10/2021   HGB 14.1 07/10/2021   HCT 40.8 07/10/2021   MCV 91.1 07/10/2021   PLT 169 07/10/2021     Assessment/Plan:  1 Day Post-Op   - Expected postop acute blood loss anemia - will monitor for symptoms - Up with PT/OT - did very well yesterday - DVT ppx - SCDs, ambulation, aspirin - WBAT operative extremity - Pain control - Discharge planning - anticipate clearing PT this morning then d/c home - patient has already picked up postop meds  Eduard Roux 07/10/2021, 7:48 AM

## 2021-07-10 NOTE — Progress Notes (Signed)
OT Cancellation Note  Patient Details Name: Gregory MIKAMI Sr. MRN: [id number] DOB: 1965-08-24   Cancelled Treatment:    Reason Eval/Treat Not Completed: OT screened, no needs identified, will sign off. Discussed with PT.   Delcie Ruppert,HILLARY 07/10/2021, 8:27 AM Maurie Boettcher, OT/L   Acute OT Clinical Specialist Acute Rehabilitation Services Pager [phone] Office [phone]

## 2021-07-10 NOTE — TOC Initial Note (Signed)
Transition of Care (TOC) - Initial/Assessment Note    Patient Details  Name: Gregory CAREL Sr. MRN: [id number] Date of Birth: 1965-03-04  Transition of Care [facility]) CM/SW Contact:    Marilu Favre, RN Phone Number: 07/10/2021, 8:34 AM  Clinical Narrative:                 Patient from home with wife. Has DME.   Dr Erlinda Hong office has arranged home health with [place]  Expected Discharge Plan: [place] Barriers to Discharge: No Barriers Identified   Patient Goals and CMS Choice Patient states their goals for this hospitalization and ongoing recovery are:: to return to home CMS Medicare.gov Compare Post Acute Care list provided to:: Patient Choice offered to / list presented to : Patient  Expected Discharge Plan and Services Expected Discharge Plan: [place]   Discharge Planning Services: CM Consult Post Acute Care Choice: [place] arrangements for the past 2 months: Single Family Home Expected Discharge Date: 07/10/21               DME Arranged: N/A         HH Arranged: PT HH Agency: [facility] Date [place]: 07/10/21 Time [place]: [phone] Representative spoke with at [place]: Marjory Lies  Prior Living Arrangements/Services Living arrangements for the past 2 months: [place] Lives with:: Spouse Patient language and need for interpreter reviewed:: Yes Do you feel safe going back to the place where you live?: Yes      Need for Family Participation in Patient Care: Yes (Comment) Care giver support system in place?: Yes (comment) Current home services: DME Criminal Activity/Legal Involvement Pertinent to Current Situation/Hospitalization: No - Comment as needed  Activities of Daily Living Home Assistive Devices/Equipment: Gilford Rile (specify type) ADL Screening (condition at time of admission) Patient's cognitive ability adequate to safely complete daily activities?: Yes Is the patient  deaf or have difficulty hearing?: No Does the patient have difficulty seeing, even when wearing glasses/contacts?: No Does the patient have difficulty concentrating, remembering, or making decisions?: No Patient able to express need for assistance with ADLs?: Yes Does the patient have difficulty dressing or bathing?: No Independently performs ADLs?: Yes (appropriate for developmental age) Does the patient have difficulty walking or climbing stairs?: No Weakness of Legs: Left Weakness of Arms/Hands: None  Permission Sought/Granted   Permission granted to share information with : No              Emotional Assessment Appearance:: Appears stated age Attitude/Demeanor/Rapport: Engaged Affect (typically observed): Accepting Orientation: : Oriented to Situation, Oriented to  Time, Oriented to Place, Oriented to Self Alcohol / Substance Use: Not Applicable Psych Involvement: No (comment)  Admission diagnosis:  Status post total left knee replacement [Z96.652] DJD (degenerative joint disease) of knee [M17.9] Patient Active Problem List   Diagnosis Date Noted   Status post total left knee replacement 07/09/2021   DJD (degenerative joint disease) of knee 07/09/2021   Primary osteoarthritis of left knee 05/03/2021   Colon cancer screening    Polyp of colon    Musculoskeletal chest pain 10/31/2020   Fall 10/31/2020   Muscle cramps 07/27/2020   Positive ANA (antinuclear antibody) 06/09/2020   Left knee pain 04/19/2020   Biceps tendon tear 04/19/2020   Right shoulder pain 04/19/2020   Arthralgia 04/19/2020   Overweight 05/28/2019   TMJ dysfunction 08/13/2017   Right wrist pain 02/03/2017   Hypersomnia 04/22/2016  Rash and nonspecific skin eruption 04/15/2016   Leg cramps 04/15/2016   History of herpes genitalis 12/14/2014   Skin lesion 04/14/2013   Chronic low back pain 04/14/2013   Leukocytosis 08/04/2012   Generalized anxiety disorder 08/04/2012   Hypogonadism male  10/24/2011   Hypertension 09/24/2011   GERD (gastroesophageal reflux disease) 09/24/2011   Erectile dysfunction 09/24/2011   PCP:  Leone Haven, MD Pharmacy:   [facility] [phone], [place] [address] Phone: [phone] Fax: [phone]     Social Determinants of Health (SDOH) Interventions    Readmission Risk Interventions     No data to display

## 2021-07-10 NOTE — Progress Notes (Signed)
Physical Therapy Treatment & Discharge  Patient Details Name: Gregory BEALE Sr. MRN: [id number] DOB: [birth year] Today's Date: 07/10/2021   History of Present Illness Pt is a 56 y.o. M who presents s/p L TKA 07/09/2021. Significant PMH: scoliosis.    PT Comments    Pt demonstrated increased tolerance to activity and improved gait quality this session. Able to ambulate 128f with RW and supervision. Pt negotiated 10 steps with R rail and min guard. Educated on safe car transfers and proper progression of HEP and ambulation. Pt and wife with no further questions/concerns about discharge home. PT will sign off.    Recommendations for follow up therapy are one component of a multi-disciplinary discharge planning process, led by the attending physician.  Recommendations may be updated based on patient status, additional functional criteria and insurance authorization.  Follow Up Recommendations  Follow physician's recommendations for discharge plan and follow up therapies     Assistance Recommended at Discharge PRN  Patient can return home with the following Assistance with cooking/housework;Assist for transportation;Help with stairs or ramp for entrance   Equipment Recommendations  None recommended by PT (has needed DME)    Recommendations for Other Services       Precautions / Restrictions Precautions Precautions: Fall;Knee Precaution Booklet Issued: Yes (comment) Precaution Comments: no pillow under knee Restrictions Weight Bearing Restrictions: Yes LLE Weight Bearing: Weight bearing as tolerated     Mobility  Bed Mobility Overal bed mobility: Modified Independent                  Transfers Overall transfer level: Modified independent Equipment used: Rolling walker (2 wheels)                    Ambulation/Gait Ambulation/Gait assistance: Supervision Gait Distance (Feet): 150 Feet Assistive device: Rolling walker (2 wheels) Gait Pattern/deviations:  Step-through pattern, Decreased stride length, Ataxic Gait velocity: decreased Gait velocity interpretation: 1.31 - 2.62 ft/sec, indicative of limited community ambulator   General Gait Details: cues for safe walker use, demonstrated good heel strike and balance   Stairs Stairs: Yes Stairs assistance: Min guard Stair Management: One rail Right, Step to pattern, Forwards Number of Stairs: 10 General stair comments: Cues for sequencing, demonstrating good safety awareness and balance   Wheelchair Mobility    Modified Rankin (Stroke Patients Only)       Balance Overall balance assessment: Mild deficits observed, not formally tested                                          Cognition Arousal/Alertness: Awake/alert Behavior During Therapy: WFL for tasks assessed/performed Overall Cognitive Status: Within Functional Limits for tasks assessed                                          Exercises Total Joint Exercises Quad Sets: AROM, Left, 5 reps, Supine Heel Slides: AROM, Left, 5 reps, Seated Long Arc Quad: AROM, Left, 10 reps, Seated Goniometric ROM: 9-82    General Comments        Pertinent Vitals/Pain Pain Assessment Pain Assessment: Faces Faces Pain Scale: Hurts little more Pain Location: L knee Pain Descriptors / Indicators: Operative site guarding, Grimacing Pain Intervention(s): Limited activity within patient's tolerance, Monitored during session    Home Living  Prior Function            PT Goals (current goals can now be found in the care plan section) Acute Rehab PT Goals Patient Stated Goal: improve walking pattern, knee flexion PT Goal Formulation: All assessment and education complete, DC therapy Progress towards PT goals: Goals met/education completed, patient discharged from PT    Frequency    7X/week      PT Plan Current plan remains appropriate    Co-evaluation               AM-PAC PT "6 Clicks" Mobility   Outcome Measure  Help needed turning from your back to your side while in a flat bed without using bedrails?: None Help needed moving from lying on your back to sitting on the side of a flat bed without using bedrails?: None Help needed moving to and from a bed to a chair (including a wheelchair)?: None Help needed standing up from a chair using your arms (e.g., wheelchair or bedside chair)?: None Help needed to walk in hospital room?: A Little Help needed climbing 3-5 steps with a railing? : A Little 6 Click Score: 22    End of Session Equipment Utilized During Treatment: Gait belt Activity Tolerance: Patient tolerated treatment well Patient left: in bed;with call bell/phone within reach;with family/visitor present Nurse Communication: Mobility status PT Visit Diagnosis: Other abnormalities of gait and mobility (R26.89);Pain Pain - Right/Left: Left Pain - part of body: Knee     Time: 0807-0820 PT Time Calculation (min) (ACUTE ONLY): 13 min  Charges:  $Gait Training: 8-22 mins                     Mackie Pai, SPT Acute Rehabilitation Services  Office: [phone]    Mackie Pai 07/10/2021, 9:27 AM

## 2021-07-10 NOTE — Progress Notes (Signed)
Discharge instructions given to patient with medication details. He verbalized understanding and will be discharged home with spouse. He is alert and oriented x4 and will be followed up with ortho in the bundle protocol.

## 2021-07-10 NOTE — Discharge Summary (Signed)
Patient ID: Gregory Drown Sr. MRN: [id number] DOB/AGE: 01/11/1965 56 y.o.  Admit date: 07/09/2021 Discharge date: 07/10/2021  Admission Diagnoses:  Primary osteoarthritis of left knee  Discharge Diagnoses:  Principal Problem:   Primary osteoarthritis of left knee Active Problems:   Status post total left knee replacement   DJD (degenerative joint disease) of knee   Past Medical History:  Diagnosis Date   Anxiety    Bronchitis    GERD (gastroesophageal reflux disease)    Hypertension    Insomnia    Low testosterone    Osteoarthritis     Surgeries: Procedure(s): LEFT TOTAL KNEE ARTHROPLASTY on 07/09/2021   Consultants (if any):   Discharged Condition: Improved  Hospital Course: Gregory BRUMBACH Sr. is an 56 y.o. male who was admitted 07/09/2021 with a diagnosis of Primary osteoarthritis of left knee and went to the operating room on 07/09/2021 and underwent the above named procedures.    He was given perioperative antibiotics:  Anti-infectives (From admission, onward)    Start     Dose/Rate Route Frequency Ordered Stop   07/09/21 1330  ceFAZolin (ANCEF) IVPB 2g/100 mL premix        2 g 200 mL/hr over 30 Minutes Intravenous Every 6 hours 07/09/21 1215 07/09/21 1913   07/09/21 0725  vancomycin (VANCOCIN) powder  Status:  Discontinued          As needed 07/09/21 0725 07/09/21 0924   07/09/21 0600  ceFAZolin (ANCEF) IVPB 2g/100 mL premix        2 g 200 mL/hr over 30 Minutes Intravenous On call to O.R. 07/09/21 8921 07/09/21 1941     .  He was given sequential compression devices, early ambulation, and appropriate chemoprophylaxis for DVT prophylaxis.  He benefited maximally from the hospital stay and there were no complications.    Recent vital signs:  Vitals:   07/09/21 2017 07/10/21 0425  BP: 119/74 (!) 157/103  Pulse: 67 61  Resp: 20 20  Temp: 98.3 F (36.8 C) 98.3 F (36.8 C)  SpO2: 99% 100%    Recent laboratory studies:  Lab Results  Component Value  Date   HGB 14.1 07/10/2021   HGB 16.1 07/02/2021   HGB 16.4 04/06/2021   Lab Results  Component Value Date   WBC 11.9 (H) 07/10/2021   PLT 169 07/10/2021   No results found for: "INR" Lab Results  Component Value Date   NA 142 07/02/2021   K 4.5 07/02/2021   CL 110 07/02/2021   CO2 27 07/02/2021   BUN 12 07/02/2021   CREATININE 1.10 07/02/2021   GLUCOSE 89 07/02/2021    Discharge Medications:   Allergies as of 07/10/2021       Reactions   Tessalon Perles [benzonatate]    drowsy        Medication List     TAKE these medications    albuterol 108 (90 Base) MCG/ACT inhaler Commonly known as: VENTOLIN HFA Inhale 1-2 puffs into the lungs every 6 (six) hours as needed for wheezing or shortness of breath.   ALPRAZolam 0.5 MG tablet Commonly known as: XANAX TAKE 1 TABLET(0.5 MG) BY MOUTH THREE TIMES DAILY AS NEEDED FOR ANXIETY   aspirin EC 81 MG tablet Take 1 tablet (81 mg total) by mouth in the morning and at bedtime. To be taken after surgery to prevent blood clots.  Swallow whole.   docusate sodium 100 MG capsule Commonly known as: Colace Take 1 capsule (100 mg total) by  mouth daily as needed.   gabapentin 400 MG capsule Commonly known as: NEURONTIN TAKE 2 CAPSULES(800 MG) BY MOUTH THREE TIMES DAILY   lisinopril 20 MG tablet Commonly known as: ZESTRIL TAKE 1 TABLET(20 MG) BY MOUTH DAILY What changed: See the new instructions.   multivitamin tablet Take 1 tablet by mouth daily.   omeprazole 40 MG capsule Commonly known as: PRILOSEC TAKE ONE CAPSULE BY MOUTH DAILY   ondansetron 4 MG tablet Commonly known as: Zofran Take 1 tablet (4 mg total) by mouth every 8 (eight) hours as needed for nausea or vomiting.   oxyCODONE-acetaminophen 5-325 MG tablet Commonly known as: Percocet Take 1-2 tablets by mouth every 6 (six) hours as needed. To be taken after surgery   testosterone cypionate 200 MG/ML injection Commonly known as: DEPOTESTOSTERONE  CYPIONATE Inject 160 mg into the skin every 14 (fourteen) days.   tiZANidine 4 MG tablet Commonly known as: ZANAFLEX TAKE 1 TABLET(4 MG) BY MOUTH TWICE DAILY AS NEEDED FOR MUSCLE SPASMS               Durable Medical Equipment  (From admission, onward)           Start     Ordered   07/09/21 1216  DME Walker rolling  Once       Question Answer Comment  Walker: With 5 Inch Wheels   Patient needs a walker to treat with the following condition Status post left partial knee replacement      07/09/21 1215   07/09/21 1216  DME 3 n 1  Once        07/09/21 1215   07/09/21 1216  DME Bedside commode  Once       Question:  Patient needs a bedside commode to treat with the following condition  Answer:  Status post left partial knee replacement   07/09/21 1215            Diagnostic Studies: DG Knee Left Port  Result Date: 07/09/2021 CLINICAL DATA:  Status post knee replacement EXAM: PORTABLE LEFT KNEE - 1-2 VIEW COMPARISON:  Knee radiograph 04/19/2020. FINDINGS: Interval left total knee arthroplasty. Components are in appropriate position without evidence of complication. Moderate amount of subcutaneous emphysema, consistent with postoperative state. Moderate suprapatellar joint effusion. IMPRESSION: Expected postoperative changes of left total knee arthroplasty. Electronically Signed   By: Gregory Stokes M.D.   On: 07/09/2021 10:02    Disposition: Discharge disposition: 01-Home or Self Care       Discharge Instructions     Call MD / Call 911   Complete by: As directed    If you experience chest pain or shortness of breath, CALL 911 and be transported to the hospital emergency room.  If you develope a fever above 101.5 F, pus (white drainage) or increased drainage or redness at the wound, or calf pain, call your surgeon's office.   Constipation Prevention   Complete by: As directed    Drink plenty of fluids.  Prune juice may be helpful.  You may use a stool softener, such as  Colace (over the counter) 100 mg twice a day.  Use MiraLax (over the counter) for constipation as needed.   Driving restrictions   Complete by: As directed    No driving while taking narcotic pain meds.   Increase activity slowly as tolerated   Complete by: As directed    Post-operative opioid taper instructions:   Complete by: As directed    POST-OPERATIVE OPIOID TAPER INSTRUCTIONS: It  is important to wean off of your opioid medication as soon as possible. If you do not need pain medication after your surgery it is ok to stop day one. Opioids include: Codeine, Hydrocodone(Norco, Vicodin), Oxycodone(Percocet, oxycontin) and hydromorphone amongst others.  Long term and even short term use of opiods can cause: Increased pain response Dependence Constipation Depression Respiratory depression And more.  Withdrawal symptoms can include Flu like symptoms Nausea, vomiting And more Techniques to manage these symptoms Hydrate well Eat regular healthy meals Stay active Use relaxation techniques(deep breathing, meditating, yoga) Do Not substitute Alcohol to help with tapering If you have been on opioids for less than two weeks and do not have pain than it is ok to stop all together.  Plan to wean off of opioids This plan should start within one week post op of your joint replacement. Maintain the same interval or time between taking each dose and first decrease the dose.  Cut the total daily intake of opioids by one tablet each day Next start to increase the time between doses. The last dose that should be eliminated is the evening dose.           Follow-up Information     Leandrew Koyanagi, MD. Schedule an appointment as soon as possible for a visit in 2 week(s).   Specialty: Orthopedic Surgery Contact information: [address] [place] [address] [phone]                  Signed: Eduard Roux 07/10/2021, 7:50 AM

## 2021-07-11 ENCOUNTER — Telehealth: Payer: Self-pay | Admitting: *Deleted

## 2021-07-11 ENCOUNTER — Encounter (HOSPITAL_COMMUNITY): Payer: Self-pay | Admitting: Orthopaedic Surgery

## 2021-07-11 ENCOUNTER — Telehealth (INDEPENDENT_AMBULATORY_CARE_PROVIDER_SITE_OTHER): Payer: Medicare Other | Admitting: Family Medicine

## 2021-07-11 DIAGNOSIS — I10 Essential (primary) hypertension: Secondary | ICD-10-CM

## 2021-07-11 DIAGNOSIS — M545 Low back pain, unspecified: Secondary | ICD-10-CM | POA: Insufficient documentation

## 2021-07-11 DIAGNOSIS — M5116 Intervertebral disc disorders with radiculopathy, lumbar region: Secondary | ICD-10-CM | POA: Insufficient documentation

## 2021-07-11 DIAGNOSIS — F411 Generalized anxiety disorder: Secondary | ICD-10-CM

## 2021-07-11 DIAGNOSIS — S46312A Strain of muscle, fascia and tendon of triceps, left arm, initial encounter: Secondary | ICD-10-CM

## 2021-07-11 DIAGNOSIS — S46319A Strain of muscle, fascia and tendon of triceps, unspecified arm, initial encounter: Secondary | ICD-10-CM | POA: Insufficient documentation

## 2021-07-11 DIAGNOSIS — K219 Gastro-esophageal reflux disease without esophagitis: Secondary | ICD-10-CM

## 2021-07-11 DIAGNOSIS — Z96652 Presence of left artificial knee joint: Secondary | ICD-10-CM

## 2021-07-11 NOTE — Assessment & Plan Note (Signed)
Stable.  He can continue Xanax 0.5 mg 3 times daily as needed for anxiety.  I did discuss minimizing use of this while he is on the oxycodone.  Discussed taking them at the same time if he does have to take the Xanax.  He will monitor for excessive drowsiness.

## 2021-07-11 NOTE — Progress Notes (Signed)
Virtual Visit via video Note  This visit type was conducted due to national recommendations for restrictions regarding the COVID-19 pandemic (e.g. social distancing).  This format is felt to be most appropriate for this patient at this time.  All issues noted in this document were discussed and addressed.  No physical exam was performed (except for noted visual exam findings with Video Visits).   I connected with Gregory Stokes today at  8:00 AM EDT by a video enabled telemedicine application or telephone and verified that I am speaking with the correct person using two identifiers. Location patient: home Location provider: work Persons participating in the virtual visit: patient, provider, Nuchem Grattan (wife)  I discussed the limitations, risks, security and privacy concerns of performing an evaluation and management service by telephone and the availability of in person appointments. I also discussed with the patient that there may be a patient responsible charge related to this service. The patient expressed understanding and agreed to proceed.   Reason for visit: Follow-up.  HPI: Anxiety: Patient notes this is well controlled.  Has been limiting his Xanax to only when really needed given that he has been on pain medicine following knee surgery.  He does have some drowsiness so he attributes that to the pain medication.  Status post total knee replacement: Patient notes this seems to have gone as well as expected.  His pain is tolerable when he is taking the pain medicine.  He tried to taper down on this though the pain was excessive so he is back to her regular regimen.  His knee surgery was 2 days ago.  He is up walking some with a cane.  He notes a little burning sensation with urination from having a catheter in place and that is getting better.  He has been using ice and compression for his knee.  Hypertension: Patient notes it was well controlled prior to surgery.  It did go up to the 150s  over 90 though did trend down after surgery.  He is taking lisinopril.  No chest pain or shortness of breath.  Reflux: Patient notes he had to come off of his omeprazole for the surgery.  This led to some increase in reflux though prior to that he was not having any.  No blood in his stool.  No dysphagia.  He has restarted his omeprazole.  Left upper arm pain: Patient notes he was starting to fall and his son caught him sometime ago.  It felt like something tore in his left upper tricep area.  He had some bruising.  He notes it has gotten somewhat better.  He has no pain right now though he is on pain medication.  He noted previously pushing motions were uncomfortable related to this discomfort.   ROS: See pertinent positives and negatives per HPI.  Past Medical History:  Diagnosis Date   Anxiety    Bronchitis    GERD (gastroesophageal reflux disease)    Hypertension    Insomnia    Low testosterone    Osteoarthritis     Past Surgical History:  Procedure Laterality Date   COLONOSCOPY WITH PROPOFOL N/A 01/24/2021   Procedure: COLONOSCOPY WITH PROPOFOL;  Surgeon: Lin Landsman, MD;  Location: [facility] ENDOSCOPY;  Service: Gastroenterology;  Laterality: N/A;   NASAL SINUS SURGERY     Dr. Carlis Abbott   TOTAL KNEE ARTHROPLASTY Left 07/09/2021   Procedure: LEFT TOTAL KNEE ARTHROPLASTY;  Surgeon: Leandrew Koyanagi, MD;  Location: [facility];  Service:  Orthopedics;  Laterality: Left;   Unremarkable     WRIST SURGERY Right     Family History  Problem Relation Age of Onset   Lung cancer Father        deceased   Parkinson's disease Mother     SOCIAL HX: Former smoker   Current Outpatient Medications:    albuterol (PROVENTIL HFA;VENTOLIN HFA) 108 (90 Base) MCG/ACT inhaler, Inhale 1-2 puffs into the lungs every 6 (six) hours as needed for wheezing or shortness of breath., Disp: 1 Inhaler, Rfl: 1   ALPRAZolam (XANAX) 0.5 MG tablet, TAKE 1 TABLET(0.5 MG) BY MOUTH THREE TIMES DAILY AS NEEDED FOR ANXIETY,  Disp: 90 tablet, Rfl: 0   aspirin EC 81 MG tablet, Take 1 tablet (81 mg total) by mouth in the morning and at bedtime. To be taken after surgery to prevent blood clots.  Swallow whole., Disp: 84 tablet, Rfl: 2   gabapentin (NEURONTIN) 400 MG capsule, TAKE 2 CAPSULES(800 MG) BY MOUTH THREE TIMES DAILY, Disp: 540 capsule, Rfl: 1   lisinopril (ZESTRIL) 20 MG tablet, TAKE 1 TABLET(20 MG) BY MOUTH DAILY (Patient taking differently: Take 5 mg by mouth daily.), Disp: 90 tablet, Rfl: 3   Multiple Vitamin (MULTIVITAMIN) tablet, Take 1 tablet by mouth daily., Disp: , Rfl:    omeprazole (PRILOSEC) 40 MG capsule, TAKE ONE CAPSULE BY MOUTH DAILY, Disp: 150 capsule, Rfl: 1   oxyCODONE-acetaminophen (PERCOCET) 5-325 MG tablet, Take 1-2 tablets by mouth every 6 (six) hours as needed. To be taken after surgery, Disp: 40 tablet, Rfl: 0   testosterone cypionate (DEPOTESTOSTERONE CYPIONATE) 200 MG/ML injection, Inject 160 mg into the skin every 14 (fourteen) days., Disp: , Rfl:    tiZANidine (ZANAFLEX) 4 MG tablet, TAKE 1 TABLET(4 MG) BY MOUTH TWICE DAILY AS NEEDED FOR MUSCLE SPASMS, Disp: 60 tablet, Rfl: 0   Avanafil (STENDRA) 200 MG TABS, TAKE 1/2 TABLET BY MOUTH AS DIRECTED, Disp: , Rfl:    docusate sodium (COLACE) 100 MG capsule, Take 1 capsule (100 mg total) by mouth daily as needed. (Patient not taking: Reported on 07/11/2021), Disp: 30 capsule, Rfl: 2   ondansetron (ZOFRAN) 4 MG tablet, Take 1 tablet (4 mg total) by mouth every 8 (eight) hours as needed for nausea or vomiting. (Patient not taking: Reported on 07/11/2021), Disp: 40 tablet, Rfl: 0  EXAM:  VITALS per patient if applicable:  GENERAL: alert, oriented, appears well and in no acute distress  HEENT: atraumatic, conjunttiva clear, no obvious abnormalities on inspection of external nose and ears  NECK: normal movements of the head and neck  LUNGS: on inspection no signs of respiratory distress, breathing rate appears normal, no obvious gross SOB,  gasping or wheezing  CV: no obvious cyanosis  MS: moves all visible extremities without noticeable abnormality  PSYCH/NEURO: pleasant and cooperative, no obvious depression or anxiety, speech and thought processing grossly intact  ASSESSMENT AND PLAN:  Discussed the following assessment and plan:  Problem List Items Addressed This Visit     Generalized anxiety disorder (Chronic)    Stable.  He can continue Xanax 0.5 mg 3 times daily as needed for anxiety.  I did discuss minimizing use of this while he is on the oxycodone.  Discussed taking them at the same time if he does have to take the Xanax.  He will monitor for excessive drowsiness.      GERD (gastroesophageal reflux disease) (Chronic)    Adequately controlled on omeprazole.  He will continue omeprazole 40 mg once daily.  Hypertension (Chronic)    Patient reports this is well controlled prior to his surgery.  He will continue lisinopril 20 mg once daily.      Relevant Medications   Avanafil (STENDRA) 200 MG TABS   Status post total left knee replacement    Patient is 2 days status post left knee replacement.  He will continue to follow with orthopedics.  I did discuss that his pain medicine is there to help control his pain.  Monitor for excessive drowsiness with the pain medication.  Discussed it may take a number of days for the discomfort from the catheter being in place to resolve.      Triceps strain    I suspect he had a strain of his left triceps though he could have had a small tear given his description.  At this time he has no pain though he is on pain medication.  He will monitor and he will mention this to his orthopedist.  Certainly he can also follow-up with me if the pain returns once he tapers down on the narcotic pain medication.       Return in about 3 months (around 10/11/2021) for Anxiety.   I discussed the assessment and treatment plan with the patient. The patient was provided an opportunity to  ask questions and all were answered. The patient agreed with the plan and demonstrated an understanding of the instructions.   The patient was advised to call back or seek an in-person evaluation if the symptoms worsen or if the condition fails to improve as anticipated.   [name], MD

## 2021-07-11 NOTE — Assessment & Plan Note (Signed)
Patient reports this is well controlled prior to his surgery.  He will continue lisinopril 20 mg once daily.

## 2021-07-11 NOTE — Assessment & Plan Note (Addendum)
Patient is 2 days status post left knee replacement.  He will continue to follow with orthopedics.  I did discuss that his pain medicine is there to help control his pain.  Monitor for excessive drowsiness with the pain medication.  Discussed it may take a number of days for the discomfort from the catheter being in place to resolve.

## 2021-07-11 NOTE — Assessment & Plan Note (Addendum)
I suspect he had a strain of his left triceps though he could have had a small tear given his description.  At this time he has no pain though he is on pain medication.  He will monitor and he will mention this to his orthopedist.  Certainly he can also follow-up with me if the pain returns once he tapers down on the narcotic pain medication.

## 2021-07-11 NOTE — Telephone Encounter (Signed)
Ortho bundle D/C call completed. 

## 2021-07-11 NOTE — Assessment & Plan Note (Signed)
Adequately controlled on omeprazole.  He will continue omeprazole 40 mg once daily.

## 2021-07-13 ENCOUNTER — Other Ambulatory Visit: Payer: Self-pay | Admitting: Physician Assistant

## 2021-07-13 ENCOUNTER — Telehealth: Payer: Self-pay | Admitting: Orthopaedic Surgery

## 2021-07-13 MED ORDER — OXYCODONE-ACETAMINOPHEN 5-325 MG PO TABS: 1.0000 | ORAL_TABLET | Freq: Four times a day (QID) | ORAL | 0 refills | Status: DC | PRN

## 2021-07-13 NOTE — Telephone Encounter (Signed)
Sent in

## 2021-07-13 NOTE — Telephone Encounter (Signed)
Called and notified patient's wife.  

## 2021-07-13 NOTE — Telephone Encounter (Signed)
Patient's wife Amy called advised patient need Rx refilled  Oxycodone.  Amy said he will be out before the weekend is over. Amy advised patient is one day of medication left.   The number to contact Amy is [phone]

## 2021-07-18 ENCOUNTER — Other Ambulatory Visit: Payer: Self-pay | Admitting: Physician Assistant

## 2021-07-18 ENCOUNTER — Telehealth: Payer: Self-pay | Admitting: *Deleted

## 2021-07-18 MED ORDER — KETOROLAC TROMETHAMINE 10 MG PO TABS: 10.0000 mg | ORAL_TABLET | Freq: Two times a day (BID) | ORAL | 0 refills | Status: DC | PRN

## 2021-07-18 NOTE — Telephone Encounter (Signed)
No, he can do both

## 2021-07-18 NOTE — Telephone Encounter (Signed)
Make sure to not exceed '4000mg'$  tylenol/day  I have also sent in toradol to take as needed

## 2021-07-18 NOTE — Telephone Encounter (Signed)
Patient's wife called and asked about pain medication. He is taking Percocet 5-'325mg'$  (2 tablets) every 4 hours and really still having a hard time with pain. Taking muscle relaxer (tizanidine) as well and Neurontin -same dose he has been on for years she states. He has been running a low grade fever (never over 100). She is giving 1 Tylenol here and there she states for the fever as well. Any other recommendations for pain? Anti-inflammatories? That may help. Thanks.

## 2021-07-19 NOTE — Telephone Encounter (Signed)
Call to patient's wife and updated on new medication.

## 2021-07-21 NOTE — Telephone Encounter (Signed)
Error. ng 

## 2021-07-23 ENCOUNTER — Other Ambulatory Visit: Payer: Self-pay | Admitting: Physician Assistant

## 2021-07-23 ENCOUNTER — Telehealth: Payer: Self-pay | Admitting: *Deleted

## 2021-07-23 MED ORDER — OXYCODONE-ACETAMINOPHEN 5-325 MG PO TABS: 1.0000 | ORAL_TABLET | Freq: Four times a day (QID) | ORAL | 0 refills | Status: DC | PRN

## 2021-07-23 NOTE — Telephone Encounter (Signed)
Patient called (wife) and states he needs a refill of pain medication. Was taking Toradol, which really helped, but know this isn't probably a continued option. Does need refill of Percocet. He's out of both of these. Appointment tomorrow and we can discuss further.

## 2021-07-24 ENCOUNTER — Other Ambulatory Visit: Payer: Self-pay | Admitting: Physician Assistant

## 2021-07-24 ENCOUNTER — Telehealth: Payer: Self-pay | Admitting: Orthopaedic Surgery

## 2021-07-24 ENCOUNTER — Ambulatory Visit (INDEPENDENT_AMBULATORY_CARE_PROVIDER_SITE_OTHER): Payer: Medicare Other | Admitting: Physician Assistant

## 2021-07-24 DIAGNOSIS — Z96652 Presence of left artificial knee joint: Secondary | ICD-10-CM

## 2021-07-24 MED ORDER — AMOXICILLIN 500 MG PO CAPS: ORAL_CAPSULE | ORAL | 2 refills | Status: AC

## 2021-07-24 MED ORDER — OXYCODONE-ACETAMINOPHEN 10-325 MG PO TABS: 1.0000 | ORAL_TABLET | Freq: Four times a day (QID) | ORAL | 0 refills | Status: DC | PRN

## 2021-07-24 MED ORDER — CELECOXIB 200 MG PO CAPS: 200.0000 mg | ORAL_CAPSULE | Freq: Two times a day (BID) | ORAL | 0 refills | Status: DC | PRN

## 2021-07-24 NOTE — Telephone Encounter (Signed)
Patient's wife Amy called advised she spoke with the pharmacy today and they do not have percocet 10. Amy asked if the Rx can be rewritten and sent back to the pharmacy. Amy said her husband have not had any pain medication and is in pain. Tried to [facility].  Jamse Arn may be at the hospital with a patient.   The number to contact Amy is [phone]

## 2021-07-24 NOTE — Telephone Encounter (Signed)
Thanks for sending in gil

## 2021-07-24 NOTE — Progress Notes (Signed)
Post-Op Visit Note   Patient: Gregory BARSTOW Sr.           Date of Birth: 09-24-1965           MRN: [id number] Visit Date: 07/24/2021 PCP: Leone Haven, MD   Assessment & Plan:  Chief Complaint:  Chief Complaint  Patient presents with   Left Knee - Follow-up    Left TKA 07/09/2021   Visit Diagnoses:  1. H/O total knee replacement, left     Plan: patient comes in two weeks s/p left total knee replacement.  7.3.23.  He has been in a moderate amount of pain but does have a history of chronic oxycodone use previously prescribed by pain management years ago.  He has been taking Percocet, muscle relaxers, gabapentin and 1 week of Toradol which did seem to increase his symptom relief.  He has been getting home health physical therapy and is scheduled to start outpatient therapy in a few days.  He has been compliant taking his baby aspirin twice daily.  Examination of the left knee reveals a fully healed surgical scar without complication.  Calf is soft nontender.  He is neurovascular intact distally.  Today, Steri-Strips were applied.  Continue with his baby aspirin twice daily for another 4 weeks for DVT prophylaxis.  I have agreed to increase his Percocet and have also called in Celebrex.  He is on Prilosec for GI protection.  Sent in amoxicillin as he has a scheduled dental appointment at the end of August.  Follow-up in 4 weeks for repeat evaluation and x-rays of the left knee.  Call with concerns or questions.  Follow-Up Instructions: Return in about 4 weeks (around 08/21/2021).   Orders:  Orders Placed This Encounter  Procedures   Ambulatory referral to Pain Clinic   Meds ordered this encounter  Medications   oxyCODONE-acetaminophen (PERCOCET) 10-325 MG tablet    Sig: Take 1-2 tablets by mouth every 6 (six) hours as needed for pain.    Dispense:  40 tablet    Refill:  0   amoxicillin (AMOXIL) 500 MG capsule    Sig: Take 4 pills one hour prior to dental work    Dispense:  8  capsule    Refill:  2    Imaging: No results found.  PMFS History: Patient Active Problem List   Diagnosis Date Noted   Lumbar disc prolapse with compression radiculopathy 07/11/2021   Triceps strain 07/11/2021   Status post total left knee replacement 07/09/2021   DJD (degenerative joint disease) of knee 07/09/2021   Primary osteoarthritis of left knee 05/03/2021   Colon cancer screening    Polyp of colon    Musculoskeletal chest pain 10/31/2020   Fall 10/31/2020   Muscle cramps 07/27/2020   Positive ANA (antinuclear antibody) 06/09/2020   Left knee pain 04/19/2020   Biceps tendon tear 04/19/2020   Right shoulder pain 04/19/2020   Arthralgia 04/19/2020   Overweight 05/28/2019   TMJ dysfunction 08/13/2017   Right wrist pain 02/03/2017   Hypersomnia 04/22/2016   Rash and nonspecific skin eruption 04/15/2016   Leg cramps 04/15/2016   History of herpes genitalis 12/14/2014   Skin lesion 04/14/2013   Chronic low back pain 04/14/2013   Leukocytosis 08/04/2012   Generalized anxiety disorder 08/04/2012   Hypogonadism male 10/24/2011   Hypertension 09/24/2011   GERD (gastroesophageal reflux disease) 09/24/2011   Erectile dysfunction 09/24/2011   Past Medical History:  Diagnosis Date   Anxiety    Bronchitis  GERD (gastroesophageal reflux disease)    Hypertension    Insomnia    Low testosterone    Osteoarthritis     Family History  Problem Relation Age of Onset   Lung cancer Father        deceased   Parkinson's disease Mother     Past Surgical History:  Procedure Laterality Date   COLONOSCOPY WITH PROPOFOL N/A 01/24/2021   Procedure: COLONOSCOPY WITH PROPOFOL;  Surgeon: Lin Landsman, MD;  Location: [facility];  Service: Gastroenterology;  Laterality: N/A;   NASAL SINUS SURGERY     Dr. Carlis Abbott   TOTAL KNEE ARTHROPLASTY Left 07/09/2021   Procedure: LEFT TOTAL KNEE ARTHROPLASTY;  Surgeon: Leandrew Koyanagi, MD;  Location: [place];  Service: Orthopedics;   Laterality: Left;   Unremarkable     WRIST SURGERY Right    Social History   Occupational History   Not on file  Tobacco Use   Smoking status: Former    Types: Cigarettes    Quit date: 2010    Years since quitting: 13.5   Smokeless tobacco: Never   Tobacco comments:    smoked age [age] stopped then age 52 x 1 years max 1/2 ppd   Vaping Use   Vaping Use: Never used  Substance and Sexual Activity   Alcohol use: No    Alcohol/week: 0.0 standard drinks of alcohol   Drug use: No   Sexual activity: Yes

## 2021-07-24 NOTE — Telephone Encounter (Signed)
Sent in

## 2021-07-24 NOTE — Telephone Encounter (Signed)
Patient called in stating the pharmacy says they do not have his order for  oxyCODONE-acetaminophen (PERCOCET) 10-325 MG tablet  Please call in order today

## 2021-07-24 NOTE — Telephone Encounter (Signed)
Called and notified patient that it has been sent to [facility] in [place]

## 2021-07-25 ENCOUNTER — Telehealth: Payer: Self-pay | Admitting: *Deleted

## 2021-07-25 NOTE — Telephone Encounter (Signed)
Ortho bundle 14 day in office visit completed on 07/24/21.

## 2021-07-26 ENCOUNTER — Ambulatory Visit: Payer: Medicare Other | Attending: Orthopaedic Surgery

## 2021-07-26 DIAGNOSIS — M1712 Unilateral primary osteoarthritis, left knee: Secondary | ICD-10-CM | POA: Insufficient documentation

## 2021-07-26 DIAGNOSIS — M25662 Stiffness of left knee, not elsewhere classified: Secondary | ICD-10-CM | POA: Insufficient documentation

## 2021-07-26 DIAGNOSIS — R262 Difficulty in walking, not elsewhere classified: Secondary | ICD-10-CM | POA: Diagnosis present

## 2021-07-26 NOTE — Therapy (Signed)
[place] MAIN [facility] SERVICES [address] [place], [place], [address] Phone: [phone]   Fax:  [phone]  Physical Therapy Evaluation  Patient Details  Name: Gregory ROUTE Sr. MRN: [id number] Date of Birth: 26-Apr-1965 Referring Provider (PT): Frankey Shown, MD   Encounter Date: 07/26/2021   PT End of Session - 07/26/21 1528     Visit Number 1    Number of Visits 17    Date for PT Re-Evaluation 10/18/21    Authorization Type Medicare (traditional)    Authorization Time Period 07/26/21-10/18/21    Progress Note Due on Visit 10    PT Start Time 1426    PT Stop Time 1515    PT Time Calculation (min) 49 min    Activity Tolerance Patient tolerated treatment well;No increased pain    Behavior During Therapy WFL for tasks assessed/performed              Past Medical History:  Diagnosis Date   Anxiety    Bronchitis    GERD (gastroesophageal reflux disease)    Hypertension    Insomnia    Low testosterone    Osteoarthritis     Past Surgical History:  Procedure Laterality Date   COLONOSCOPY WITH PROPOFOL N/A 01/24/2021   Procedure: COLONOSCOPY WITH PROPOFOL;  Surgeon: Lin Landsman, MD;  Location: [facility];  Service: Gastroenterology;  Laterality: N/A;   NASAL SINUS SURGERY     Dr. Carlis Abbott   TOTAL KNEE ARTHROPLASTY Left 07/09/2021   Procedure: LEFT TOTAL KNEE ARTHROPLASTY;  Surgeon: Leandrew Koyanagi, MD;  Location: [place];  Service: Orthopedics;  Laterality: Left;   Unremarkable     WRIST SURGERY Right     There were no vitals filed for this visit.  **Purpose of referral: rehabilitation   Subjective Assessment - 07/26/21 1442     Subjective Pt here for evaluation or Left TKA, recently finished HHPT.    Pertinent History Gregory Meester Sr. is a Gregory Stokes who presents to OPPT 07/26/21 for rehabilization of Left TKA (07/08/21). Pt inititally had some pain management difficulty first postop week, but is improved now. He has been DC  from [place] and has been working on his DC HEP.    How long can you sit comfortably? 3 minutes    How long can you stand comfortably? 3 minutes    How long can you walk comfortably? ~265f or so    Currently in Pain? Yes    Pain Score 6    Left knee achiness   Pain Relieving Factors Still using polar care, taking prescribed anagesics                [facility]       Assessment   Medical Diagnosis Left TKA    Referring Provider (PT) NFrankey Shown MD    Onset Date/Surgical Date 07/09/21    Hand Dominance Right    Next MD Visit August 21, 2021    Prior Therapy HHPT, now DC      Precautions   Precautions Knee    Precaution Comments history of Rt sciatica, back pain      Restrictions   Weight Bearing Restrictions Yes    LLE Weight Bearing Weight bearing as tolerated      Balance Screen   Has the patient fallen in the past 6 months No    Has the patient had a decrease in activity level because of a fear of falling?  No    Is the patient reluctant to leave their home because of a fear of falling?  No      Home Environment   Living Environment Private residence    Living Arrangements Spouse/significant other;Children   14yoson   Available Help at Discharge Family    Type of [place]      Prior Function   Level of [place] Retired    Leisure wants to be able to return to AMB on [employer], woods, using step stools, ladders for IADL household tasks.      Observation/Other Assessments   Focus on Therapeutic Outcomes (FOTO)  41      Observation/Other Assessments-Edema    Edema --   trypical postoperative TKA swelling for timeframe, no areas of concerning redness     Transfers   Five time sit to stand comments  27.85sec    slight elevation, cues for hands on knees     Ambulation/Gait   Ambulation Distance (Feet) 300 Feet    Assistive device Straight cane;None   none fo r2nd lap   Gait Pattern Step-through  pattern;Within Functional Limits   excellent symetry and nautral movement of left knee in gait cycle, no abberant LLE abduction   Ambulation Surface Level;Indoor    Gait velocity 0.48ms    Gait Comments SPC RUE in 2-point pattern, mirrored with LLE            Left knee flexion ROM: 9-75 degrees  HEP review/therex: -Left quadset 1x15x3secH  -Left SLR 1x10 -Left SAQ 1x5 from towel roll, then 1x10 from elevated bolster (45-10 degrees without SLR)  -hooklying bridge 1x10 *education on use of RW v SPC, daily walking, use of polar care     Objective measurements completed on examination: See above findings.     PT Education - 07/26/21 1528     Education Details reviewed current HHPT HEP with updates    Person(s) Educated Patient;Spouse    Methods Explanation;Demonstration;Tactile cues    Comprehension Tactile cues required;Verbalized understanding;Returned demonstration              PT Short Term Goals - 07/26/21 1532       PT SHORT TERM GOAL #1   Title Pt to demosntrate Left knee flexion ROM 10-100 degrees.    Baseline Eval: 9-75 Left knee flexion    Time 3    Period Weeks    Status New    Target Date 08/16/21      PT SHORT TERM GOAL #2   Title Pt to demonstrate performance of [id number] asisstive device ad lib without loss of symetry and without progression of antalgia, gait speed >1.46m75m    Baseline Eval: 300f3fC for part, 0.36m/44minimal antalgia    Time 4    Period Weeks    Status New    Target Date 08/23/21      PT SHORT TERM GOAL #3   Title 5xSTS <16sec hands-free from standard chair height    Baseline eval: needs elevation of seat and hands on knees for 25sec    Time 4    Period Weeks    Status New    Target Date 08/23/21               PT Long Term Goals - 07/26/21 1535       PT LONG TERM GOAL #1   Title FOTO score improvement >14 points to indicate reduced difficulty in lifting groceries, light household  actiivties, heavy household  activities, typical housework, school work, or hobbies.    Baseline Eval: 40    Time 8    Period Weeks    Status New    Target Date 09/20/21      PT LONG TERM GOAL #2   Title Pt to improve tolerance of comfortable sitting to >2 hours to be able to go see a movie with family.    Baseline Eval: 3 minutes    Time 8    Period Weeks    Status New    Target Date 09/20/21      PT LONG TERM GOAL #3   Title Pt to demonstrate Lt knee flexion ROM <10->120 degrees to facilitate tolerance of basic sitting postures required for ADL, IADL.    Baseline eval: 9-75 degrees    Time 8    Period Weeks    Status New    Target Date 09/20/21      PT LONG TERM GOAL #4   Title Pt to demonstrate tolerance of 6MWT >1549f with good symetry, no LOB, AD ad lib, no antalgia.    Baseline eval: 3046f   Time 8    Period Weeks    Status New    Target Date 09/20/21      PT LONG TERM GOAL #5   Title 5xSTS< 12sec hands free, standard surface height to improve tolerance of typical daily mobility needs.    Baseline *see ST goals    Time 10    Period Weeks    Status New    Target Date 10/04/21                    Plan - 07/26/21 1539     Clinical Impression Statement Pt evlauated 17days post Left TKA, WBAT, now AMB at home ad lib with SPC or RW ad lib, performing his HEP issued by HHPT. Examination revealing of ROM limitations, stiffness and swelling of left knee, weakness and pain, all of which are impacting pt's safety, tolerance, and independence of basic movements and mobility required for participation in typical ADL, IADL as per PLOF. Pt will benefit from skilled PT intervention to address these deficits and restore to PLOF.    Personal Factors and Comorbidities Behavior Pattern;Fitness;Time since onset of injury/illness/exacerbation    Examination-Activity Limitations Carry;Locomotion Level;Squat;Stand;Transfers    Examination-Participation Restrictions Driving;Interpersonal Relationship;Yard  Work;Community Activity;Cleaning;Laundry    Stability/Clinical Decision Making Stable/Uncomplicated    Clinical Decision Making Low    Rehab Potential Excellent    PT Frequency 2x / week    PT Duration 12 weeks    PT Treatment/Interventions ADLs/Self Care Home Management;Electrical Stimulation;Moist Heat;Cryotherapy;DME Instruction;Gait training;Stair training;Functional mobility training;Therapeutic activities;Therapeutic exercise;Balance training;Neuromuscular re-education;Patient/family education;Prosthetic Training;Compression bandaging;Scar mobilization;Passive range of motion;Dry needling    PT Next Visit Plan Finish review and upate of HHPT HEP    PT Home Exercise Plan eval: reviewed quad set, SLR, SAQ (recommended elevated bolster to 45 degree start), added in hooklying bridge as new one. Did not have time to review all others    Consulted and Agree with Plan of Care Patient;Family member/caregiver    Family Member Consulted spouse             Patient will benefit from skilled therapeutic intervention in order to improve the following deficits and impairments:  Abnormal gait, Decreased balance, Decreased activity tolerance, Decreased knowledge of use of DME, Decreased mobility, Decreased range of motion, Decreased strength, Difficulty walking  Visit Diagnosis: Stiffness of  left knee, not elsewhere classified  Difficulty in walking, not elsewhere classified     Problem List Patient Active Problem List   Diagnosis Date Noted   Lumbar disc prolapse with compression radiculopathy 07/11/2021   Triceps strain 07/11/2021   Status post total left knee replacement 07/09/2021   DJD (degenerative joint disease) of knee 07/09/2021   Primary osteoarthritis of left knee 05/03/2021   Colon cancer screening    Polyp of colon    Musculoskeletal chest pain 10/31/2020   Fall 10/31/2020   Muscle cramps 07/27/2020   Positive ANA (antinuclear antibody) 06/09/2020   Left knee pain  04/19/2020   Biceps tendon tear 04/19/2020   Right shoulder pain 04/19/2020   Arthralgia 04/19/2020   Overweight 05/28/2019   TMJ dysfunction 08/13/2017   Right wrist pain 02/03/2017   Hypersomnia 04/22/2016   Rash and nonspecific skin eruption 04/15/2016   Leg cramps 04/15/2016   History of herpes genitalis 12/14/2014   Skin lesion 04/14/2013   Chronic low back pain 04/14/2013   Leukocytosis 08/04/2012   Generalized anxiety disorder 08/04/2012   Hypogonadism male 10/24/2011   Hypertension 09/24/2011   GERD (gastroesophageal reflux disease) 09/24/2011   Erectile dysfunction 09/24/2011   3:49 PM, 07/26/21 Etta Grandchild, PT, DPT Physical Therapist - [facility]  Outpatient Physical Therapy- [place] [phone]     Ailey, PT 07/26/2021, 3:47 PM  [place] MAIN [facility] SERVICES [address] [place], [place], [address] Phone: [phone]   Fax:  [phone]  Name: Gregory HAMME Sr. MRN: [id number] Date of Birth: [birth year]

## 2021-07-30 ENCOUNTER — Telehealth: Payer: Self-pay | Admitting: *Deleted

## 2021-07-30 NOTE — Telephone Encounter (Signed)
Patient sent me message requesting refill of both pain medication and Celebrex. I would just send to the pharmacy we did that had it in stock. [facility] [address]. Thank you.

## 2021-07-31 ENCOUNTER — Other Ambulatory Visit: Payer: Self-pay | Admitting: Physician Assistant

## 2021-07-31 ENCOUNTER — Ambulatory Visit: Payer: Medicare Other

## 2021-07-31 DIAGNOSIS — M25662 Stiffness of left knee, not elsewhere classified: Secondary | ICD-10-CM | POA: Diagnosis not present

## 2021-07-31 DIAGNOSIS — R262 Difficulty in walking, not elsewhere classified: Secondary | ICD-10-CM

## 2021-07-31 MED ORDER — CELECOXIB 200 MG PO CAPS: 200.0000 mg | ORAL_CAPSULE | Freq: Two times a day (BID) | ORAL | 0 refills | Status: DC | PRN

## 2021-07-31 MED ORDER — OXYCODONE-ACETAMINOPHEN 10-325 MG PO TABS: 1.0000 | ORAL_TABLET | Freq: Two times a day (BID) | ORAL | 0 refills | Status: DC | PRN

## 2021-07-31 NOTE — Telephone Encounter (Signed)
Ok thanks for clarifying.  Next refill can be for 1 tablet every 12 hrs prn.  Thanks.

## 2021-07-31 NOTE — Telephone Encounter (Signed)
Can we figure out how much and how often he's taking the painkillers?  Seems to be going through them fairly quickly

## 2021-07-31 NOTE — Telephone Encounter (Signed)
Sent wife message updating on new Rx has been sent to pharmacy.

## 2021-07-31 NOTE — Telephone Encounter (Signed)
Xu, do you still want me writing this much pain meds?  He is taking quite a bit and getting frequent refills

## 2021-07-31 NOTE — Therapy (Signed)
[place] MAIN [facility] SERVICES [address] [place], [place], [address] Phone: [phone]   Fax:  [phone]  [place] at [facility] Orthopedic Physical Therapy Treatment   Patient Details  Name: Gregory WINEMILLER Sr. MRN: [id number] Date of Birth: [birth year] Referring Provider (PT): Frankey Shown, MD   Encounter Date: 07/31/2021   PT End of Session - 07/31/21 1435     Visit Number 2    Number of Visits 17    Date for PT Re-Evaluation 10/18/21    Authorization Type Medicare (traditional)    Authorization Time Period 07/26/21-10/18/21    Progress Note Due on Visit 10    PT Start Time 1430    PT Stop Time 1510    PT Time Calculation (min) 40 min    Equipment Utilized During Treatment Gait belt    Activity Tolerance Patient tolerated treatment well;No increased pain    Behavior During Therapy WFL for tasks assessed/performed              Past Medical History:  Diagnosis Date   Anxiety    Bronchitis    GERD (gastroesophageal reflux disease)    Hypertension    Insomnia    Low testosterone    Osteoarthritis     Past Surgical History:  Procedure Laterality Date   COLONOSCOPY WITH PROPOFOL N/A 01/24/2021   Procedure: COLONOSCOPY WITH PROPOFOL;  Surgeon: Lin Landsman, MD;  Location: [facility] ENDOSCOPY;  Service: Gastroenterology;  Laterality: N/A;   NASAL SINUS SURGERY     Dr. Carlis Abbott   TOTAL KNEE ARTHROPLASTY Left 07/09/2021   Procedure: LEFT TOTAL KNEE ARTHROPLASTY;  Surgeon: Leandrew Koyanagi, MD;  Location: [place];  Service: Orthopedics;  Laterality: Left;   Unremarkable     WRIST SURGERY Right     There were no vitals filed for this visit.  **Purpose of referral: rehabilitation    Subjective Assessment - 07/31/21 1454     Subjective Pt reports he has been AMB quite a bit last few days, he has been running errands with his wife. He says his knee is more sore as a result which has been a hinderance to being  able to perform HEP. No other updates since last session.    Pertinent History Gregory Willems Sr. is a Gregory Stokes who presents to OPPT 07/26/21 for rehabilization of Left TKA (07/08/21). Pt inititally had some pain management difficulty first postop week, but is improved now. He has been DC from [place] and has been working on his DC HEP.    Currently in Pain? Yes    Pain Score 6     Pain Location --   surgical left knee superior to patella             INTERVENTION: -Wellzone recumbent bike, seat 30, pt unable to fully complete cycle, so "boomeranging" for 4 minutes working on flexion -application of TENS, 2 leads crossing the patella in a "X" diagonally, strength to preference  -application of ice compress  Supine on plinth  -Left quad set into towel roll 2x15x3secH  -Left knee SAQ on maroon bolster 1x15x1secH -Left heel slides with isometric HS activation 15x3secH  -Supine LLE marching with gravity assisted knee flexion 1x10 -Left knee SAQ on maroon bolster 1x15x1secH -Left heel slides with isometric HS activation 15x3secH  -Supine LLE marching with gravity assisted knee flexion 1x10  -Prone Left knee flexion hamstrings activation 1x15 (towel roll under mid quads to avoid weiht bearing on patella)  -  Rt sidelying LLE hip ABDCT 1x10 AA/ROM in low range -seated edge of plinth heel slides for flexion extension ROM 1x20 *pt able to achieve 90+ degrees flexion this date, lacking ~ 15 degrees TKE *Pain at end of session decreased from 6/10 at start to 4/10 at end   HEP review completed and issued on paper and SMS    Objective measurements completed on examination: See above findings.     PT Education - 07/31/21 1437     Education Details Pacing efforts and not exceeeding activity limitations    Person(s) Educated Patient    Methods Explanation    Comprehension Verbalized understanding              PT Short Term Goals - 07/26/21 1532       PT SHORT TERM GOAL #1   Title Pt to  demosntrate Left knee flexion ROM 10-100 degrees.    Baseline Eval: 9-75 Left knee flexion    Time 3    Period Weeks    Status New    Target Date 08/16/21      PT SHORT TERM GOAL #2   Title Pt to demonstrate performance of 810f AMB asisstive device ad lib without loss of symetry and without progression of antalgia, gait speed >1.027m.    Baseline Eval: 3003fPC for part, 0.43m3mminimal antalgia    Time 4    Period Weeks    Status New    Target Date 08/23/21      PT SHORT TERM GOAL #3   Title 5xSTS <16sec hands-free from standard chair height    Baseline eval: needs elevation of seat and hands on knees for 25sec    Time 4    Period Weeks    Status New    Target Date 08/23/21               PT Long Term Goals - 07/26/21 1535       PT LONG TERM GOAL #1   Title FOTO score improvement >14 points to indicate reduced difficulty in lifting groceries, light household actiivties, heavy household activities, typical housework, school work, or hobbies.    Baseline Eval: 40    Time 8    Period Weeks    Status New    Target Date 09/20/21      PT LONG TERM GOAL #2   Title Pt to improve tolerance of comfortable sitting to >2 hours to be able to go see a movie with family.    Baseline Eval: 3 minutes    Time 8    Period Weeks    Status New    Target Date 09/20/21      PT LONG TERM GOAL #3   Title Pt to demonstrate Lt knee flexion ROM <10->120 degrees to facilitate tolerance of basic sitting postures required for ADL, IADL.    Baseline eval: 9-75 degrees    Time 8    Period Weeks    Status New    Target Date 09/20/21      PT LONG TERM GOAL #4   Title Pt to demonstrate tolerance of 6MWT >1500ft63fh good symetry, no LOB, AD ad lib, no antalgia.    Baseline eval: 300ft 74fime 8    Period Weeks    Status New    Target Date 09/20/21      PT LONG TERM GOAL #5   Title 5xSTS< 12sec hands free, standard surface height to improve tolerance of typical daily  mobility needs.     Baseline *see ST goals    Time 10    Period Weeks    Status New    Target Date 10/04/21                  Plan - 07/31/21 1458     Clinical Impression Statement Pt arrives with aggravation of knee from a busy few days of AMB, also walked in from parking lot. Used bike to focus on flexion ROM. Application of TENS an cryotherapy to mediate neurogenic inhibition from joint effusion. Reviewed HEP in detail with updates issue. Good activation and tolerance to exercises in session, occasional cues to decrease intensity based on grimacing. ROM to 90 degrees flexion today improved since prior session. Pt progressing well today goals. Encouraged to modifiy actiivty in prudence and improve compliance with HEP.    Personal Factors and Comorbidities Behavior Pattern;Fitness;Time since onset of injury/illness/exacerbation    Examination-Activity Limitations Carry;Locomotion Level;Squat;Stand;Transfers    Examination-Participation Restrictions Driving;Interpersonal Relationship;Yard Work;Community Activity;Cleaning;Laundry    Stability/Clinical Decision Making Stable/Uncomplicated    Clinical Decision Making Low    Rehab Potential Excellent    PT Frequency 2x / week    PT Duration 12 weeks    PT Treatment/Interventions ADLs/Self Care Home Management;Electrical Stimulation;Moist Heat;Cryotherapy;DME Instruction;Gait training;Stair training;Functional mobility training;Therapeutic activities;Therapeutic exercise;Balance training;Neuromuscular re-education;Patient/family education;Prosthetic Training;Compression bandaging;Scar mobilization;Passive range of motion;Dry needling    PT Next Visit Plan Promote activity modiciation, compliance with HEP updates, ESTIM and ICE to facilitate tolerance and quads lag, ROM    PT Home Exercise Plan  [id number]   Consulted and Agree with Plan of Care Patient                  Patient will benefit from skilled therapeutic intervention in order to improve  the following deficits and impairments:     Visit Diagnosis: Stiffness of left knee, not elsewhere classified  Difficulty in walking, not elsewhere classified     Problem List Patient Active Problem List   Diagnosis Date Noted   Lumbar disc prolapse with compression radiculopathy 07/11/2021   Triceps strain 07/11/2021   Status post total left knee replacement 07/09/2021   DJD (degenerative joint disease) of knee 07/09/2021   Primary osteoarthritis of left knee 05/03/2021   Colon cancer screening    Polyp of colon    Musculoskeletal chest pain 10/31/2020   Fall 10/31/2020   Muscle cramps 07/27/2020   Positive ANA (antinuclear antibody) 06/09/2020   Left knee pain 04/19/2020   Biceps tendon tear 04/19/2020   Right shoulder pain 04/19/2020   Arthralgia 04/19/2020   Overweight 05/28/2019   TMJ dysfunction 08/13/2017   Right wrist pain 02/03/2017   Hypersomnia 04/22/2016   Rash and nonspecific skin eruption 04/15/2016   Leg cramps 04/15/2016   History of herpes genitalis 12/14/2014   Skin lesion 04/14/2013   Chronic low back pain 04/14/2013   Leukocytosis 08/04/2012   Generalized anxiety disorder 08/04/2012   Hypogonadism male 10/24/2011   Hypertension 09/24/2011   GERD (gastroesophageal reflux disease) 09/24/2011   Erectile dysfunction 09/24/2011   2:52 PM, 07/31/21 Etta Grandchild, PT, DPT Physical Therapist - [facility]  Outpatient Physical Therapy- [place] [phone]     [place], PT 07/31/2021, 2:52 PM  [place] MAIN [facility] SERVICES [address] [place], [place], [address] Phone: [phone]   Fax:  [phone]  Name: Gregory HARDCASTLE Sr. MRN: [id number] Date of Birth: 03/22/1965

## 2021-07-31 NOTE — Telephone Encounter (Signed)
Thanks for speaking to her sheri.  I sent in the two meds.

## 2021-08-02 ENCOUNTER — Ambulatory Visit: Payer: Medicare Other | Admitting: Physical Therapy

## 2021-08-02 DIAGNOSIS — M25662 Stiffness of left knee, not elsewhere classified: Secondary | ICD-10-CM

## 2021-08-02 DIAGNOSIS — R262 Difficulty in walking, not elsewhere classified: Secondary | ICD-10-CM

## 2021-08-02 NOTE — Therapy (Signed)
[place] MAIN [facility] SERVICES [address] [place], [place], [address] Phone: [phone]   Fax:  [phone]  [place] at [facility] Orthopedic Physical Therapy Treatment   Patient Details  Name: Gregory MINSHALL Sr. MRN: [id number] Date of Birth: 1965-04-23 Referring Provider (PT): Frankey Shown, MD   Encounter Date: 08/02/2021   PT End of Session - 08/02/21 1426     Visit Number 3    Number of Visits 17    Date for PT Re-Evaluation 10/18/21    Authorization Type Medicare (traditional)    Authorization Time Period 07/26/21-10/18/21    Progress Note Due on Visit 10    PT Start Time 0230    PT Stop Time 0315    PT Time Calculation (min) 45 min    Equipment Utilized During Treatment Gait belt    Activity Tolerance Patient tolerated treatment well;No increased pain    Behavior During Therapy WFL for tasks assessed/performed               Past Medical History:  Diagnosis Date   Anxiety    Bronchitis    GERD (gastroesophageal reflux disease)    Hypertension    Insomnia    Low testosterone    Osteoarthritis     Past Surgical History:  Procedure Laterality Date   COLONOSCOPY WITH PROPOFOL N/A 01/24/2021   Procedure: COLONOSCOPY WITH PROPOFOL;  Surgeon: Lin Landsman, MD;  Location: [facility] ENDOSCOPY;  Service: Gastroenterology;  Laterality: N/A;   NASAL SINUS SURGERY     Dr. Carlis Abbott   TOTAL KNEE ARTHROPLASTY Left 07/09/2021   Procedure: LEFT TOTAL KNEE ARTHROPLASTY;  Surgeon: Leandrew Koyanagi, MD;  Location: [place];  Service: Orthopedics;  Laterality: Left;   Unremarkable     WRIST SURGERY Right     There were no vitals filed for this visit.  **Purpose of referral: rehabilitation    Subjective Assessment - 07/31/21 1454     Subjective Pt reports he has been AMB quite a bit last few days, he has been running errands with his wife. He says his knee is more sore as a result which has been a hinderance to  being able to perform HEP. No other updates since last session.    Pertinent History Gregory Bracco Sr. is a Gregory Stokes who presents to OPPT 07/26/21 for rehabilization of Left TKA (07/08/21). Pt inititally had some pain management difficulty first postop week, but is improved now. He has been DC from [place] and has been working on his DC HEP.    Currently in Pain? Yes    Pain Score 6     Pain Location --   surgical left knee superior to patella             INTERVENTION: -Nustep seat 12 x 2:30 min, seat 11 x 2:30  to work on knee flexion ROM and knee extension strength and endurance   Standing:  Marching x 10 ea LE with UE support for gravity assisted knee flexion  Heel Raises 2*15 reps  L LE TKE with YTB   Seated on plinth: LAQ followed by end range flexion holds for 5 seconds each x 10     Supine on plinth  -Left quad set into towel roll under heel 1x15x3secH measured to 5 degrees from full extension foloiwng -Left knee SAQ on maroon bolster 1x15x3secH -Left heel slides with isometric HS activation 2x10*5secH  -Supine LLE marching with gravity assisted knee flexion 2x10  Ice provided for final 5 minutes of session for pain relief and to prevent new onset of swelling. - no charge for this       PT Short Term Goals -       PT SHORT TERM GOAL #1   Title Pt to demosntrate Left knee flexion ROM 10-100 degrees.    Baseline Eval: 9-75 Left knee flexion    Time 3    Period Weeks    Status New    Target Date 08/16/21      PT SHORT TERM GOAL #2   Title Pt to demonstrate performance of 840f AMB asisstive device ad lib without loss of symetry and without progression of antalgia, gait speed >1.088m.    Baseline Eval: 30062fPC for part, 0.55m64mminimal antalgia    Time 4    Period Weeks    Status New    Target Date 08/23/21      PT SHORT TERM GOAL #3   Title 5xSTS <16sec hands-free from standard chair height    Baseline eval: needs elevation of seat and hands on knees for 25sec     Time 4    Period Weeks    Status New    Target Date 08/23/21               PT Long Term Goals -       PT LONG TERM GOAL #1   Title FOTO score improvement >14 points to indicate reduced difficulty in lifting groceries, light household actiivties, heavy household activities, typical housework, school work, or hobbies.    Baseline Eval: 40    Time 8    Period Weeks    Status New    Target Date 09/20/21      PT LONG TERM GOAL #2   Title Pt to improve tolerance of comfortable sitting to >2 hours to be able to go see a movie with family.    Baseline Eval: 3 minutes    Time 8    Period Weeks    Status New    Target Date 09/20/21      PT LONG TERM GOAL #3   Title Pt to demonstrate Lt knee flexion ROM <10->120 degrees to facilitate tolerance of basic sitting postures required for ADL, IADL.    Baseline eval: 9-75 degrees    Time 8    Period Weeks    Status New    Target Date 09/20/21      PT LONG TERM GOAL #4   Title Pt to demonstrate tolerance of 6MWT >1500ft17fh good symetry, no LOB, AD ad lib, no antalgia.    Baseline eval: 300ft 39fime 8    Period Weeks    Status New    Target Date 09/20/21      PT LONG TERM GOAL #5   Title 5xSTS< 12sec hands free, standard surface height to improve tolerance of typical daily mobility needs.    Baseline *see ST goals    Time 10    Period Weeks    Status New    Target Date 10/04/21                  Plan - 07/31/21 1458     Clinical Impression Statement Continued with current plan of care as laid out in evaluation and recent prior sessions. Pt remains motivated to advance progress toward goals in order to maximize independence and safety at home. Pt requires high level assistance and cuing for  completion of exercises in order to provide adequate level of stimulation challenge while minimizing pain and discomfort when possible. Pt closely monitored throughout session pt response and to maximize patient safety during  interventions. Pt continues to demonstrate progress toward goals AEB progression of interventions this date either in volume or intensity.    Personal Factors and Comorbidities Behavior Pattern;Fitness;Time since onset of injury/illness/exacerbation    Examination-Activity Limitations Carry;Locomotion Level;Squat;Stand;Transfers    Examination-Participation Restrictions Driving;Interpersonal Relationship;Yard Work;Community Activity;Cleaning;Laundry    Stability/Clinical Decision Making Stable/Uncomplicated    Clinical Decision Making Low    Rehab Potential Excellent    PT Frequency 2x / week    PT Duration 12 weeks    PT Treatment/Interventions ADLs/Self Care Home Management;Electrical Stimulation;Moist Heat;Cryotherapy;DME Instruction;Gait training;Stair training;Functional mobility training;Therapeutic activities;Therapeutic exercise;Balance training;Neuromuscular re-education;Patient/family education;Prosthetic Training;Compression bandaging;Scar mobilization;Passive range of motion;Dry needling    PT Next Visit Plan Promote activity modiciation, compliance with HEP updates, ESTIM and ICE to facilitate tolerance and quads lag, ROM    PT Home Exercise Plan  [id number]   Consulted and Agree with Plan of Care Patient                  Patient will benefit from skilled therapeutic intervention in order to improve the following deficits and impairments:     Visit Diagnosis: Stiffness of left knee, not elsewhere classified  Difficulty in walking, not elsewhere classified     Problem List Patient Active Problem List   Diagnosis Date Noted   Lumbar disc prolapse with compression radiculopathy 07/11/2021   Triceps strain 07/11/2021   Status post total left knee replacement 07/09/2021   DJD (degenerative joint disease) of knee 07/09/2021   Primary osteoarthritis of left knee 05/03/2021   Colon cancer screening    Polyp of colon    Musculoskeletal chest pain 10/31/2020   Fall  10/31/2020   Muscle cramps 07/27/2020   Positive ANA (antinuclear antibody) 06/09/2020   Left knee pain 04/19/2020   Biceps tendon tear 04/19/2020   Right shoulder pain 04/19/2020   Arthralgia 04/19/2020   Overweight 05/28/2019   TMJ dysfunction 08/13/2017   Right wrist pain 02/03/2017   Hypersomnia 04/22/2016   Rash and nonspecific skin eruption 04/15/2016   Leg cramps 04/15/2016   History of herpes genitalis 12/14/2014   Skin lesion 04/14/2013   Chronic low back pain 04/14/2013   Leukocytosis 08/04/2012   Generalized anxiety disorder 08/04/2012   Hypogonadism male 10/24/2011   Hypertension 09/24/2011   GERD (gastroesophageal reflux disease) 09/24/2011   Erectile dysfunction 09/24/2011   2:27 PM, 08/02/21  Particia Lather, PT 08/02/2021, 2:27 PM  [place] [address] [place], [place], [address] Phone: [phone]   Fax:  [phone]  Name: Gregory BORGES Sr. MRN: [id number] Date of Birth: Nov 21, 1965

## 2021-08-04 ENCOUNTER — Other Ambulatory Visit: Payer: Self-pay | Admitting: Physician Assistant

## 2021-08-07 ENCOUNTER — Encounter: Payer: Self-pay | Admitting: Physical Therapy

## 2021-08-07 ENCOUNTER — Ambulatory Visit: Payer: Medicare Other | Attending: Orthopaedic Surgery | Admitting: Physical Therapy

## 2021-08-07 ENCOUNTER — Encounter: Payer: Self-pay | Admitting: Physical Medicine & Rehabilitation

## 2021-08-07 DIAGNOSIS — M6281 Muscle weakness (generalized): Secondary | ICD-10-CM | POA: Insufficient documentation

## 2021-08-07 DIAGNOSIS — M25662 Stiffness of left knee, not elsewhere classified: Secondary | ICD-10-CM | POA: Diagnosis present

## 2021-08-07 DIAGNOSIS — R262 Difficulty in walking, not elsewhere classified: Secondary | ICD-10-CM | POA: Diagnosis present

## 2021-08-07 NOTE — Therapy (Signed)
[facility] SERVICES [address] [place], [place], [address] Phone: [phone]   Fax:  [phone]  [place] Orthopedic Physical Therapy Treatment   Patient Details  Name: Gregory BHAKTA Sr. MRN: [id number] Date of Birth: 08-13-1965 Referring Provider (PT): Frankey Shown, MD   Encounter Date: 08/07/2021   PT End of Session - 08/07/21 1016     Visit Number 4    Number of Visits 17    Date for PT Re-Evaluation 10/18/21    Authorization Type Medicare (traditional)    Authorization Time Period 07/26/21-10/18/21    Progress Note Due on Visit 10    PT Start Time 1014    PT Stop Time 1100    PT Time Calculation (min) 46 min    Equipment Utilized During Treatment Gait belt    Activity Tolerance Patient tolerated treatment well;No increased pain    Behavior During Therapy WFL for tasks assessed/performed               Past Medical History:  Diagnosis Date   Anxiety    Bronchitis    GERD (gastroesophageal reflux disease)    Hypertension    Insomnia    Low testosterone    Osteoarthritis     Past Surgical History:  Procedure Laterality Date   COLONOSCOPY WITH PROPOFOL N/A 01/24/2021   Procedure: COLONOSCOPY WITH PROPOFOL;  Surgeon: Lin Landsman, MD;  Location: [facility] ENDOSCOPY;  Service: Gastroenterology;  Laterality: N/A;   NASAL SINUS SURGERY     Dr. Carlis Abbott   TOTAL KNEE ARTHROPLASTY Left 07/09/2021   Procedure: LEFT TOTAL KNEE ARTHROPLASTY;  Surgeon: Leandrew Koyanagi, MD;  Location: [place];  Service: Orthopedics;  Laterality: Left;   Unremarkable     WRIST SURGERY Right     There were no vitals filed for this visit.  **Purpose of referral: rehabilitation    Subjective Assessment -     Subjective Pt reports he has stopped pain meds- He reports not sleeping well last night with sweats and increased pain; He reports doing a lot of activity at home and uses that as his exercises;     Pertinent History Gregory Strauch Sr. is a [facility] who presents to [facility] 07/26/21 for rehabilization of Left TKA (07/08/21). Pt inititally had some pain management difficulty first postop week, but is improved now. He has been DC from [place] and has been working on his DC HEP.    Currently in Pain? Yes    Pain Score 4-5   Pain Location --   surgical left knee superior to patella             INTERVENTION: -crosstrainer, seat #16 with good stretch felt in both direction x4 min (unbilled);  Standing:  Marching x 10 ea LE with UE support for gravity assisted knee flexion  LLE single leg Heel Raises 2*10 reps  L LE TKE with YTB x15 reps with min VCS for proper positioning/exercise technique;   Leg press: BLE 40# 2x10 reps- moderate difficulty reported;  Attempted LLE only, at 25#- pt unable due to weakness;  Supine on plinth  -Left quad set into towel roll under heel 5 sec hold x10 reps, measured to 5 degrees from full extension following  -Left heel slides AROM x10 reps PT performed grade II-III AP mobs to left knee in flexion to facilitate increased knee ROM 10 sec bouts x3 sets with moderate pain reported;  PT measured LLE knee  flexion 108 degrees with moderate discomfort   Seated on plinth: LAQ followed by end range flexion holds for 5 seconds each x 15 concurrent with cryotherapy for better tolerance;     Pt educated on importance of continue with ROM and cryotherapy as needed. He exhibits minimal swelling and incision is looking good.      PT Short Term Goals -       PT SHORT TERM GOAL #1   Title Pt to demosntrate Left knee flexion ROM 10-100 degrees.    Baseline Eval: 9-75 Left knee flexion    Time 3    Period Weeks    Status New    Target Date 08/16/21      PT SHORT TERM GOAL #2   Title Pt to demonstrate performance of 862f AMB asisstive device ad lib without loss of symetry and without progression of antalgia, gait speed >1.025m.    Baseline Eval: 30051fPC for part,  0.49m75mminimal antalgia    Time 4    Period Weeks    Status New    Target Date 08/23/21      PT SHORT TERM GOAL #3   Title 5xSTS <16sec hands-free from standard chair height    Baseline eval: needs elevation of seat and hands on knees for 25sec    Time 4    Period Weeks    Status New    Target Date 08/23/21               PT Long Term Goals -       PT LONG TERM GOAL #1   Title FOTO score improvement >14 points to indicate reduced difficulty in lifting groceries, light household actiivties, heavy household activities, typical housework, school work, or hobbies.    Baseline Eval: 40    Time 8    Period Weeks    Status New    Target Date 09/20/21      PT LONG TERM GOAL #2   Title Pt to improve tolerance of comfortable sitting to >2 hours to be able to go see a movie with family.    Baseline Eval: 3 minutes    Time 8    Period Weeks    Status New    Target Date 09/20/21      PT LONG TERM GOAL #3   Title Pt to demonstrate Lt knee flexion ROM <10->120 degrees to facilitate tolerance of basic sitting postures required for ADL, IADL.    Baseline eval: 9-75 degrees    Time 8    Period Weeks    Status New    Target Date 09/20/21      PT LONG TERM GOAL #4   Title Pt to demonstrate tolerance of 6MWT >1500ft38fh good symetry, no LOB, AD ad lib, no antalgia.    Baseline eval: 300ft 32fime 8    Period Weeks    Status New    Target Date 09/20/21      PT LONG TERM GOAL #5   Title 5xSTS< 12sec hands free, standard surface height to improve tolerance of typical daily mobility needs.    Baseline *see ST goals    Time 10    Period Weeks    Status New    Target Date 10/04/21                  Plan -    Clinical Impression Statement Patient motivated and participated well within session. He was experiencing more pain  today as he stopped taking pain meds. Patient continues to have stiffness in LLE and weakness. He was instructed in advanced ROM/strengthening  exercise. He does require min VCs for correct positioning/exercise technique. Progressed exercise, instructing patient in leg press. Patient unable to do single leg, leg press due to weakness. Patient would benefit from additional skilled PT intervention to improve strength, balance and gait safety;     Personal Factors and Comorbidities Behavior Pattern;Fitness;Time since onset of injury/illness/exacerbation    Examination-Activity Limitations Carry;Locomotion Level;Squat;Stand;Transfers    Examination-Participation Restrictions Driving;Interpersonal Relationship;Yard Work;Community Activity;Cleaning;Laundry    Stability/Clinical Decision Making Stable/Uncomplicated    Clinical Decision Making Low    Rehab Potential Excellent    PT Frequency 2x / week    PT Duration 12 weeks    PT Treatment/Interventions ADLs/Self Care Home Management;Electrical Stimulation;Moist Heat;Cryotherapy;DME Instruction;Gait training;Stair training;Functional mobility training;Therapeutic activities;Therapeutic exercise;Balance training;Neuromuscular re-education;Patient/family education;Prosthetic Training;Compression bandaging;Scar mobilization;Passive range of motion;Dry needling    PT Next Visit Plan Promote activity modiciation, compliance with HEP updates, ESTIM and ICE to facilitate tolerance and quads lag, ROM    PT Home Exercise Plan  [id number]   Consulted and Agree with Plan of Care Patient                  Patient will benefit from skilled therapeutic intervention in order to improve the following deficits and impairments:     Visit Diagnosis: Stiffness of left knee, not elsewhere classified  Difficulty in walking, not elsewhere classified     Problem List Patient Active Problem List   Diagnosis Date Noted   Lumbar disc prolapse with compression radiculopathy 07/11/2021   Triceps strain 07/11/2021   Status post total left knee replacement 07/09/2021   DJD (degenerative joint disease) of  knee 07/09/2021   Primary osteoarthritis of left knee 05/03/2021   Colon cancer screening    Polyp of colon    Musculoskeletal chest pain 10/31/2020   Fall 10/31/2020   Muscle cramps 07/27/2020   Positive ANA (antinuclear antibody) 06/09/2020   Left knee pain 04/19/2020   Biceps tendon tear 04/19/2020   Right shoulder pain 04/19/2020   Arthralgia 04/19/2020   Overweight 05/28/2019   TMJ dysfunction 08/13/2017   Right wrist pain 02/03/2017   Hypersomnia 04/22/2016   Rash and nonspecific skin eruption 04/15/2016   Leg cramps 04/15/2016   History of herpes genitalis 12/14/2014   Skin lesion 04/14/2013   Chronic low back pain 04/14/2013   Leukocytosis 08/04/2012   Generalized anxiety disorder 08/04/2012   Hypogonadism male 10/24/2011   Hypertension 09/24/2011   GERD (gastroesophageal reflux disease) 09/24/2011   Erectile dysfunction 09/24/2011   11:00 AM, 08/07/21  Christionna Poland, PT, DPT 08/07/2021, 11:00 AM  [facility] [address] [place], [place], [address] Phone: [phone]   Fax:  [phone]  Name: DIANE MOCHIZUKI Sr. MRN: [id number] Date of Birth: Nov 18, 1965

## 2021-08-09 ENCOUNTER — Encounter: Payer: Self-pay | Admitting: Physical Therapy

## 2021-08-09 ENCOUNTER — Ambulatory Visit: Payer: Medicare Other | Admitting: Physical Therapy

## 2021-08-09 DIAGNOSIS — R262 Difficulty in walking, not elsewhere classified: Secondary | ICD-10-CM

## 2021-08-09 DIAGNOSIS — M25662 Stiffness of left knee, not elsewhere classified: Secondary | ICD-10-CM | POA: Diagnosis not present

## 2021-08-09 NOTE — Therapy (Signed)
OUTPATIENT PHYSICAL THERAPY TREATMENT NOTE   Patient Name: Gregory WINCHELL Sr. MRN: [id number] DOB:1965/03/30, 56 y.o., male Today's Date: 08/09/2021  PCP:   Leone Haven, MD   REFERRING PROVIDER:  Leandrew Koyanagi, MD   PT End of Session - 08/09/21 1436     Visit Number 5    Number of Visits 17    Date for PT Re-Evaluation 10/18/21    Authorization Type Medicare (traditional)    Authorization Time Period 07/26/21-10/18/21    Progress Note Due on Visit 10    PT Start Time 1432    PT Stop Time 1515    PT Time Calculation (min) 43 min    Equipment Utilized During Treatment Gait belt    Activity Tolerance Patient tolerated treatment well;No increased pain    Behavior During Therapy WFL for tasks assessed/performed             Past Medical History:  Diagnosis Date   Anxiety    Bronchitis    GERD (gastroesophageal reflux disease)    Hypertension    Insomnia    Low testosterone    Osteoarthritis    Past Surgical History:  Procedure Laterality Date   COLONOSCOPY WITH PROPOFOL N/A 01/24/2021   Procedure: COLONOSCOPY WITH PROPOFOL;  Surgeon: Lin Landsman, MD;  Location: [facility] ENDOSCOPY;  Service: Gastroenterology;  Laterality: N/A;   NASAL SINUS SURGERY     Dr. Carlis Abbott   TOTAL KNEE ARTHROPLASTY Left 07/09/2021   Procedure: LEFT TOTAL KNEE ARTHROPLASTY;  Surgeon: Leandrew Koyanagi, MD;  Location: [place];  Service: Orthopedics;  Laterality: Left;   Unremarkable     WRIST SURGERY Right    Patient Active Problem List   Diagnosis Date Noted   Lumbar disc prolapse with compression radiculopathy 07/11/2021   Triceps strain 07/11/2021   Status post total left knee replacement 07/09/2021   DJD (degenerative joint disease) of knee 07/09/2021   Primary osteoarthritis of left knee 05/03/2021   Colon cancer screening    Polyp of colon    Musculoskeletal chest pain 10/31/2020   Fall 10/31/2020   Muscle cramps 07/27/2020   Positive ANA (antinuclear antibody) 06/09/2020   Left  knee pain 04/19/2020   Biceps tendon tear 04/19/2020   Right shoulder pain 04/19/2020   Arthralgia 04/19/2020   Overweight 05/28/2019   TMJ dysfunction 08/13/2017   Right wrist pain 02/03/2017   Hypersomnia 04/22/2016   Rash and nonspecific skin eruption 04/15/2016   Leg cramps 04/15/2016   History of herpes genitalis 12/14/2014   Skin lesion 04/14/2013   Chronic low back pain 04/14/2013   Leukocytosis 08/04/2012   Generalized anxiety disorder 08/04/2012   Hypogonadism male 10/24/2011   Hypertension 09/24/2011   GERD (gastroesophageal reflux disease) 09/24/2011   Erectile dysfunction 09/24/2011    REFERRING DIAG: M17.12 (ICD-10-CM) - Primary osteoarthritis of left knee  THERAPY DIAG:  Stiffness of left knee, not elsewhere classified  Difficulty in walking, not elsewhere classified  Rationale for Evaluation and Treatment Rehabilitation  PERTINENT HISTORY: Gregory Nickless Sr. is a 55yoM who presents to [facility] 07/26/21 for rehabilization of Left TKA (07/08/21). Pt inititally had some pain management difficulty first postop week, but is improved now. He has been DC from [place] and has been working on his DC HEP.   PRECAUTIONS: TKE  SUBJECTIVE: Pt reports increased pain in his knee and back at this time. Reports forgot to take tylenol prior to therapy and is experiencing increased pain as a result.   PAIN:  Are you having pain? Yes: NPRS scale: 6/10 Pain location: L knee  Pain description: constant Aggravating factors: surgery  Relieving factors: ice, meds      TODAY'S TREATMENT: 08/09/21   INTERVENTION:  5 min nustep level 2 a seat level 10 x 5 min   Standing:  Marching x 15 ea LE without UE support for gravity assisted knee flexion and for LE balance and proprioception  LLE single leg Heel Raises *10 reps ( pt reports high difficulty and pain with these today - 10 x B LE heel raise with UE support  L LE TKE with YTB x15 reps with min VCS for proper positioning/exercise  technique;    Seated: Ball roll outs 5 x 5 seconds ea direction  - noted pain following standing interventions. Improvement in back pain following this activity    Supine on plinth  -Left quad set into towel roll under heel 5 sec hold x10 reps, measured to 5 degrees from full extension following  -Left heel slides AROM x15 reps PT measured LLE knee flexion 107 degrees with minimal discomfort  Sidelying clam shell 2 x 10 GTB for hip strength  Supine bridge x 10    Ice applied at end of session secondary to pain exacerbation at start and throughout session x 6 min ( no charge)     PATIENT EDUCATION: Education details: Pt educated throughout session about proper posture and technique with exercises. Improved exercise technique, movement at target joints, use of target muscles after min to mod verbal, visual, tactile cues.  Person educated: Patient Education method: Explanation Education comprehension: verbalized understanding   HOME EXERCISE PROGRAM: [id number]   PT Short Term Goals -       PT SHORT TERM GOAL #1   Title Pt to demosntrate Left knee flexion ROM 10-100 degrees.    Baseline Eval: 9-75 Left knee flexion    Time 3    Period Weeks    Status New    Target Date 08/16/21      PT SHORT TERM GOAL #2   Title Pt to demonstrate performance of 866f AMB asisstive device ad lib without loss of symetry and without progression of antalgia, gait speed >1.063m.    Baseline Eval: 3008fPC for part, 0.37m33mminimal antalgia    Time 4    Period Weeks    Status New    Target Date 08/23/21      PT SHORT TERM GOAL #3   Title 5xSTS <16sec hands-free from standard chair height    Baseline eval: needs elevation of seat and hands on knees for 25sec    Time 4    Period Weeks    Status New    Target Date 08/23/21              PT Long Term Goals -      PT LONG TERM GOAL #1   Title FOTO score improvement >14 points to indicate reduced difficulty in lifting groceries,  light household actiivties, heavy household activities, typical housework, school work, or hobbies.    Baseline Eval: 40    Time 8    Period Weeks    Status New    Target Date 09/20/21      PT LONG TERM GOAL #2   Title Pt to improve tolerance of comfortable sitting to >2 hours to be able to go see a movie with family.    Baseline Eval: 3 minutes    Time 8    Period Weeks  Status New    Target Date 09/20/21      PT LONG TERM GOAL #3   Title Pt to demonstrate Lt knee flexion ROM <10->120 degrees to facilitate tolerance of basic sitting postures required for ADL, IADL.    Baseline eval: 9-75 degrees    Time 8    Period Weeks    Status New    Target Date 09/20/21      PT LONG TERM GOAL #4   Title Pt to demonstrate tolerance of 6MWT >1560f with good symetry, no LOB, AD ad lib, no antalgia.    Baseline eval: 3041f   Time 8    Period Weeks    Status New    Target Date 09/20/21      PT LONG TERM GOAL #5   Title 5xSTS< 12sec hands free, standard surface height to improve tolerance of typical daily mobility needs.    Baseline *see ST goals    Time 10    Period Weeks    Status New    Target Date 10/04/21              Plan -    Clinical Impression Statement Continued with current plan of care as laid out in evaluation and recent prior sessions. Pt remains motivated to advance progress toward goals in order to maximize independence and safety at home. Pt had increased knee and back pain again this session and both of these monitored throughout. Pt responded well to ball oll outs for back pain and pt provided with ice at end of session to involved knee with good response. Pt to encourage continue with both o these at home.  Pt continues to demonstrate progress toward goals AEB progression of interventions this date either in volume or intensity.    Personal Factors and Comorbidities Behavior Pattern;Fitness;Time since onset of injury/illness/exacerbation     Examination-Activity Limitations Carry;Locomotion Level;Squat;Stand;Transfers    Examination-Participation Restrictions Driving;Interpersonal Relationship;Yard Work;Community Activity;Cleaning;Laundry    Stability/Clinical Decision Making Stable/Uncomplicated    Rehab Potential Excellent    PT Frequency 2x / week    PT Duration 12 weeks    PT Treatment/Interventions ADLs/Self Care Home Management;Electrical Stimulation;Moist Heat;Cryotherapy;DME Instruction;Gait training;Stair training;Functional mobility training;Therapeutic activities;Therapeutic exercise;Balance training;Neuromuscular re-education;Patient/family education;Prosthetic Training;Compression bandaging;Scar mobilization;Passive range of motion;Dry needling    PT Next Visit Plan Promote activity modiciation, compliance with HEP updates, ESTIM and ICE to facilitate tolerance and quads lag, ROM    PT Home Exercise Plan eval: reviewed quad set, SLR, SAQ (recommended elevated bolster to 45 degree start), added in hooklying bridge as new one. Did not have time to review all others    Consulted and Agree with Plan of Care Patient               ChParticia LatherPT 08/09/2021, 3:44 PM

## 2021-08-14 ENCOUNTER — Ambulatory Visit: Payer: Medicare Other

## 2021-08-14 DIAGNOSIS — M25662 Stiffness of left knee, not elsewhere classified: Secondary | ICD-10-CM

## 2021-08-14 DIAGNOSIS — R262 Difficulty in walking, not elsewhere classified: Secondary | ICD-10-CM

## 2021-08-14 NOTE — Therapy (Signed)
OUTPATIENT PHYSICAL THERAPY TREATMENT NOTE   Patient Name: Gregory SIEBENALER Sr. MRN: [id number] DOB:[birth year], 56 y.o., male Today's Date: 08/14/2021  PCP:   Gregory Haven, MD   REFERRING PROVIDER:  Leandrew Koyanagi, MD   PT End of Session - 08/14/21 1203     Visit Number 6    Number of Visits 17    Date for PT Re-Evaluation 10/18/21    Authorization Type Medicare (traditional)    Authorization Time Period 07/26/21-10/18/21    Progress Note Due on Visit 10    PT Start Time 1146    PT Stop Time 1228    PT Time Calculation (min) 42 min    Equipment Utilized During Treatment Gait belt    Activity Tolerance Patient tolerated treatment well;No increased pain    Behavior During Therapy WFL for tasks assessed/performed             Past Medical History:  Diagnosis Date   Anxiety    Bronchitis    GERD (gastroesophageal reflux disease)    Hypertension    Insomnia    Low testosterone    Osteoarthritis    Past Surgical History:  Procedure Laterality Date   COLONOSCOPY WITH PROPOFOL N/A 01/24/2021   Procedure: COLONOSCOPY WITH PROPOFOL;  Surgeon: Gregory Landsman, MD;  Location: [facility];  Service: Gastroenterology;  Laterality: N/A;   NASAL SINUS SURGERY     Dr. Carlis [facility]   TOTAL KNEE ARTHROPLASTY Left 07/09/2021   Procedure: LEFT TOTAL KNEE ARTHROPLASTY;  Surgeon: Gregory Koyanagi, MD;  Location: [facility];  Service: Orthopedics;  Laterality: Left;   Unremarkable     WRIST SURGERY Right    Patient Active Problem List   Diagnosis Date Noted   Lumbar disc prolapse with compression radiculopathy 07/11/2021   Triceps strain 07/11/2021   Status post total left knee replacement 07/09/2021   DJD (degenerative joint disease) of knee 07/09/2021   Primary osteoarthritis of left knee 05/03/2021   Colon cancer screening    Polyp of colon    Musculoskeletal chest pain 10/31/2020   Fall 10/31/2020   Muscle cramps 07/27/2020   Positive ANA (antinuclear antibody) 06/09/2020   Left  knee pain 04/19/2020   Biceps tendon tear 04/19/2020   Right shoulder pain 04/19/2020   Arthralgia 04/19/2020   Overweight 05/28/2019   TMJ dysfunction 08/13/2017   Right wrist pain 02/03/2017   Hypersomnia 04/22/2016   Rash and nonspecific skin eruption 04/15/2016   Leg cramps 04/15/2016   History of herpes genitalis 12/14/2014   Skin lesion 04/14/2013   Chronic low back pain 04/14/2013   Leukocytosis 08/04/2012   Generalized anxiety disorder 08/04/2012   Hypogonadism male 10/24/2011   Hypertension 09/24/2011   GERD (gastroesophageal reflux disease) 09/24/2011   Erectile dysfunction 09/24/2011    REFERRING DIAG: M17.12 (ICD-10-CM) - Primary osteoarthritis of left knee  THERAPY DIAG:  Stiffness of left knee, not elsewhere classified  Difficulty in walking, not elsewhere classified  Rationale for Evaluation and Treatment Rehabilitation  PERTINENT HISTORY: Gregory Goodchild Sr. is a 89yoM who presents to [place] 07/26/21 for rehabilization of Left TKA (07/08/21). Pt inititally had some pain management difficulty first postop week, but is improved now. He has been DC from [place] and has been working on his DC HEP.   PRECAUTIONS: TKE  SUBJECTIVE: Pt reports with increased pain in knee. Wondering if she is overdoing it at home with the stairs.  PAIN:  Are you having pain? Yes: NPRS scale: 3/10 Pain location: L  knee  Pain description: constant Aggravating factors: surgery  Relieving factors: ice, meds      TODAY'S TREATMENT: 08/14/21  Manual Therapy pt in supine: 12 minutes total  Scar tissue massage and desensitization techniques. Education provided on superior/inferior/CW/CCW scar mobility to prevent scar tissue adhesion. Education on at home self management for desensitization and scar mobility.   L patellar mobs: medial/lateral/superior/inferior: x10 grade 2 in all planes for pain modulation. X10 grade 3 in all planes for improved mobility   There.ex:   LLE quad set with towel  roll under heel: 2x20   LLE heel slide: 2x20. Attaining 111 degrees today.  Prone knee flexion with towel roll under knee for rec fem stretch: 3x1 min holds. Education on at home performance with large towel or belt.  Leg press: BLE, 2x12 with 40# LLE only, 25#, 2x8. minA from PT due to weakness concentrically and eccentrically.        PATIENT EDUCATION: Education details: Pt educated on exercise modification, scar tissue massage, desensitization techniques. Form/technique with exercise.   Person educated: Patient Education method: Explanation Education comprehension: verbalized understanding   HOME EXERCISE PROGRAM: [id number]   PT Short Term Goals -       PT SHORT TERM GOAL #1   Title Pt to demosntrate Left knee flexion ROM 10-100 degrees.    Baseline Eval: 9-75 Left knee flexion    Time 3    Period Weeks    Status New    Target Date 08/16/21      PT SHORT TERM GOAL #2   Title Pt to demonstrate performance of 873f AMB asisstive device ad lib without loss of symetry and without progression of antalgia, gait speed >1.053m.    Baseline Eval: 30010fPC for part, 0.81m51mminimal antalgia    Time 4    Period Weeks    Status New    Target Date 08/23/21      PT SHORT TERM GOAL #3   Title 5xSTS <16sec hands-free from standard chair height    Baseline eval: needs elevation of seat and hands on knees for 25sec    Time 4    Period Weeks    Status New    Target Date 08/23/21              PT Long Term Goals -      PT LONG TERM GOAL #1   Title FOTO score improvement >14 points to indicate reduced difficulty in lifting groceries, light household actiivties, heavy household activities, typical housework, school work, or hobbies.    Baseline Eval: 40    Time 8    Period Weeks    Status New    Target Date 09/20/21      PT LONG TERM GOAL #2   Title Pt to improve tolerance of comfortable sitting to >2 hours to be able to go see a movie with family.    Baseline Eval: 3  minutes    Time 8    Period Weeks    Status New    Target Date 09/20/21      PT LONG TERM GOAL #3   Title Pt to demonstrate Lt knee flexion ROM <10->120 degrees to facilitate tolerance of basic sitting postures required for ADL, IADL.    Baseline eval: 9-75 degrees    Time 8    Period Weeks    Status New    Target Date 09/20/21      PT LONG TERM GOAL #4   Title Pt  to demonstrate tolerance of 6MWT >1552f with good symetry, no LOB, AD ad lib, no antalgia.    Baseline eval: 3077f   Time 8    Period Weeks    Status New    Target Date 09/20/21      PT LONG TERM GOAL #5   Title 5xSTS< 12sec hands free, standard surface height to improve tolerance of typical daily mobility needs.    Baseline *see ST goals    Time 10    Period Weeks    Status New    Target Date 10/04/21              Plan -    Clinical Impression Statement Continuing PT POC with focus on knee mobility and LE strengthening. Education provided on scar tissue and desensitization techniques and activity modification to improve increased pain levels in L knee. Pt verbalizing understanding of education with reduced pain after treatment today. Remains profoundly weak in LLE with extension moments ambulating with decreased hip extension in terminal stance. Pt will benefit from skilled PT services to progress Knee mobility and LE strengthening to optimize return to PLOF.      Personal Factors and Comorbidities Behavior Pattern;Fitness;Time since onset of injury/illness/exacerbation    Examination-Activity Limitations Carry;Locomotion Level;Squat;Stand;Transfers    Examination-Participation Restrictions Driving;Interpersonal Relationship;Yard Work;Community Activity;Cleaning;Laundry    Stability/Clinical Decision Making Stable/Uncomplicated    Rehab Potential Excellent    PT Frequency 2x / week    PT Duration 12 weeks    PT Treatment/Interventions ADLs/Self Care Home Management;Electrical Stimulation;Moist  Heat;Cryotherapy;DME Instruction;Gait training;Stair training;Functional mobility training;Therapeutic activities;Therapeutic exercise;Balance training;Neuromuscular re-education;Patient/family education;Prosthetic Training;Compression bandaging;Scar mobilization;Passive range of motion;Dry needling    PT Next Visit Plan Promote activity modiciation, Reassess prone quad stretch, desensitization and scar mobilization techniques.   PT Home Exercise Plan eval: reviewed quad set, SLR, SAQ (recommended elevated bolster to 45 degree start), added in hooklying bridge as new one. Did not have time to review all others    Consulted and Agree with Plan of Care Patient               MiSalem CasterFairly IV, PT, DPT Physical Therapist- [facility]8/08/2021, 12:53 PM

## 2021-08-15 ENCOUNTER — Other Ambulatory Visit: Payer: Self-pay | Admitting: Physician Assistant

## 2021-08-16 ENCOUNTER — Ambulatory Visit: Payer: Medicare Other | Admitting: Physical Therapy

## 2021-08-16 DIAGNOSIS — R262 Difficulty in walking, not elsewhere classified: Secondary | ICD-10-CM

## 2021-08-16 DIAGNOSIS — M25662 Stiffness of left knee, not elsewhere classified: Secondary | ICD-10-CM | POA: Diagnosis not present

## 2021-08-16 NOTE — Therapy (Signed)
OUTPATIENT PHYSICAL THERAPY TREATMENT NOTE   Patient Name: Gregory BUNNER Sr. MRN: [id number] DOB:[birth year], 56 y.o., male Today's Date: 08/16/2021  PCP:   Leone Haven, MD   REFERRING PROVIDER:  Leandrew Koyanagi, MD   PT End of Session - 08/16/21 1435     Visit Number 7    Number of Visits 17    Date for PT Re-Evaluation 10/18/21    Authorization Type Medicare (traditional)    Authorization Time Period 07/26/21-10/18/21    Progress Note Due on Visit 10    PT Start Time 1432    PT Stop Time 1515    PT Time Calculation (min) 43 min    Equipment Utilized During Treatment Gait belt    Activity Tolerance Patient tolerated treatment well;No increased pain    Behavior During Therapy WFL for tasks assessed/performed              Past Medical History:  Diagnosis Date   Anxiety    Bronchitis    GERD (gastroesophageal reflux disease)    Hypertension    Insomnia    Low testosterone    Osteoarthritis    Past Surgical History:  Procedure Laterality Date   COLONOSCOPY WITH PROPOFOL N/A 01/24/2021   Procedure: COLONOSCOPY WITH PROPOFOL;  Surgeon: Lin Landsman, MD;  Location: [facility];  Service: Gastroenterology;  Laterality: N/A;   NASAL SINUS SURGERY     Dr. Carlis Abbott   TOTAL KNEE ARTHROPLASTY Left 07/09/2021   Procedure: LEFT TOTAL KNEE ARTHROPLASTY;  Surgeon: Leandrew Koyanagi, MD;  Location: [place];  Service: Orthopedics;  Laterality: Left;   Unremarkable     WRIST SURGERY Right    Patient Active Problem List   Diagnosis Date Noted   Lumbar disc prolapse with compression radiculopathy 07/11/2021   Triceps strain 07/11/2021   Status post total left knee replacement 07/09/2021   DJD (degenerative joint disease) of knee 07/09/2021   Primary osteoarthritis of left knee 05/03/2021   Colon cancer screening    Polyp of colon    Musculoskeletal chest pain 10/31/2020   Fall 10/31/2020   Muscle cramps 07/27/2020   Positive ANA (antinuclear antibody) 06/09/2020    Left knee pain 04/19/2020   Biceps tendon tear 04/19/2020   Right shoulder pain 04/19/2020   Arthralgia 04/19/2020   Overweight 05/28/2019   TMJ dysfunction 08/13/2017   Right wrist pain 02/03/2017   Hypersomnia 04/22/2016   Rash and nonspecific skin eruption 04/15/2016   Leg cramps 04/15/2016   History of herpes genitalis 12/14/2014   Skin lesion 04/14/2013   Chronic low back pain 04/14/2013   Leukocytosis 08/04/2012   Generalized anxiety disorder 08/04/2012   Hypogonadism male 10/24/2011   Hypertension 09/24/2011   GERD (gastroesophageal reflux disease) 09/24/2011   Erectile dysfunction 09/24/2011    REFERRING DIAG: M17.12 (ICD-10-CM) - Primary osteoarthritis of left knee  THERAPY DIAG:  Stiffness of left knee, not elsewhere classified  Difficulty in walking, not elsewhere classified  Rationale for Evaluation and Treatment Rehabilitation  PERTINENT HISTORY: Gregory Cudworth Sr. is a 55yoM who presents to [facility] 07/26/21 for rehabilization of Left TKA (07/08/21). Pt inititally had some pain management difficulty first postop week, but is improved now. He has been DC from [place] and has been working on his DC HEP.   PRECAUTIONS: TKE  SUBJECTIVE: Pt reports increased pain in his knee and back at this time. Reports forgot to take tylenol prior to therapy and is experiencing increased pain as a result.   PAIN:  Are you having pain? Yes: NPRS scale: 4/10 Pain location: L knee  Pain description: constant Aggravating factors: surgery  Relieving factors: ice, meds      TODAY'S TREATMENT: 08/16/21   INTERVENTION:  5 min nustep level 2 a seat level 10 x 5 min   Patellar mobs   Standing:   STS with airex under R LE  - x 10   Mini squat in // bar with mirror anterior   Heel raises 2 x 20 reps no UE support   Marching x 10 ea LE with 3 # AW  or gravity assisted knee flexion and for LE balance and proprioception   Standing hip abduction 2 x 10 on L LE with 3 # AW    Semitandem on airex x 30 seconds - easy   Tandem airex ( LLE posterior) 2 x 35 seconds   Seated on elevated plinth  10 x 5 second holds alternating LAQ and knee flexion stetch with A from contralateral LE   Seated: Ball roll outs 5 x 5 seconds ea direction  - noted pain following standing interventions. Improvement in back pain following this activity    Flexion ROM 113 this date      PATIENT EDUCATION: Education details: Pt educated throughout session about proper posture and technique with exercises. Improved exercise technique, movement at target joints, use of target muscles after min to mod verbal, visual, tactile cues.  Person educated: Patient Education method: Explanation Education comprehension: verbalized understanding   HOME EXERCISE PROGRAM: [id number]   PT Short Term Goals -       PT SHORT TERM GOAL #1   Title Pt to demosntrate Left knee flexion ROM 10-100 degrees.    Baseline Eval: 9-75 Left knee flexion    Time 3    Period Weeks    Status New    Target Date 08/16/21      PT SHORT TERM GOAL #2   Title Pt to demonstrate performance of 823f AMB asisstive device ad lib without loss of symetry and without progression of antalgia, gait speed >1.026m.    Baseline Eval: 30068fPC for part, 0.28m74mminimal antalgia    Time 4    Period Weeks    Status New    Target Date 08/23/21      PT SHORT TERM GOAL #3   Title 5xSTS <16sec hands-free from standard chair height    Baseline eval: needs elevation of seat and hands on knees for 25sec    Time 4    Period Weeks    Status New    Target Date 08/23/21              PT Long Term Goals -      PT LONG TERM GOAL #1   Title FOTO score improvement >14 points to indicate reduced difficulty in lifting groceries, light household actiivties, heavy household activities, typical housework, school work, or hobbies.    Baseline Eval: 40    Time 8    Period Weeks    Status New    Target Date 09/20/21      PT  LONG TERM GOAL #2   Title Pt to improve tolerance of comfortable sitting to >2 hours to be able to go see a movie with family.    Baseline Eval: 3 minutes    Time 8    Period Weeks    Status New    Target Date 09/20/21      PT LONG TERM GOAL #3  Title Pt to demonstrate Lt knee flexion ROM <10->120 degrees to facilitate tolerance of basic sitting postures required for ADL, IADL.    Baseline eval: 9-75 degrees    Time 8    Period Weeks    Status New    Target Date 09/20/21      PT LONG TERM GOAL #4   Title Pt to demonstrate tolerance of 6MWT >1541f with good symetry, no LOB, AD ad lib, no antalgia.    Baseline eval: 304f   Time 8    Period Weeks    Status New    Target Date 09/20/21      PT LONG TERM GOAL #5   Title 5xSTS< 12sec hands free, standard surface height to improve tolerance of typical daily mobility needs.    Baseline *see ST goals    Time 10    Period Weeks    Status New    Target Date 10/04/21              Plan -    Clinical Impression Statement Continued with current plan of care as laid out in evaluation and recent prior sessions. Pt remains motivated to advance progress toward goals in order to maximize independence and safety at home.Pt continues to progress with LE strength and ROM and is very dedicated to his HEP. Pt continues to demonstrate progress toward goals AEB progression of interventions this date either in volume or intensity.    Personal Factors and Comorbidities Behavior Pattern;Fitness;Time since onset of injury/illness/exacerbation    Examination-Activity Limitations Carry;Locomotion Level;Squat;Stand;Transfers    Examination-Participation Restrictions Driving;Interpersonal Relationship;Yard Work;Community Activity;Cleaning;Laundry    Stability/Clinical Decision Making Stable/Uncomplicated    Rehab Potential Excellent    PT Frequency 2x / week    PT Duration 12 weeks    PT Treatment/Interventions ADLs/Self Care Home  Management;Electrical Stimulation;Moist Heat;Cryotherapy;DME Instruction;Gait training;Stair training;Functional mobility training;Therapeutic activities;Therapeutic exercise;Balance training;Neuromuscular re-education;Patient/family education;Prosthetic Training;Compression bandaging;Scar mobilization;Passive range of motion;Dry needling    PT Next Visit Plan Promote activity modiciation, compliance with HEP updates, ESTIM and ICE to facilitate tolerance and quads lag, ROM    PT Home Exercise Plan eval: reviewed quad set, SLR, SAQ (recommended elevated bolster to 45 degree start), added in hooklying bridge as new one. Did not have time to review all others    Consulted and Agree with Plan of Care Patient               ChParticia LatherPT 08/16/2021, 5:22 PM

## 2021-08-21 ENCOUNTER — Ambulatory Visit: Payer: Medicare Other | Admitting: Physical Therapy

## 2021-08-21 ENCOUNTER — Ambulatory Visit (INDEPENDENT_AMBULATORY_CARE_PROVIDER_SITE_OTHER): Payer: Medicare Other | Admitting: Orthopaedic Surgery

## 2021-08-21 ENCOUNTER — Ambulatory Visit (INDEPENDENT_AMBULATORY_CARE_PROVIDER_SITE_OTHER): Payer: Medicare Other

## 2021-08-21 DIAGNOSIS — Z96652 Presence of left artificial knee joint: Secondary | ICD-10-CM

## 2021-08-21 MED ORDER — HYDROCODONE-ACETAMINOPHEN 5-325 MG PO TABS: 1.0000 | ORAL_TABLET | Freq: Every day | ORAL | 0 refills | Status: DC | PRN

## 2021-08-21 NOTE — Progress Notes (Signed)
Post-Op Visit Note   Patient: Gregory PONDS Sr.           Date of Birth: June 21, 1965           MRN: [id number] Visit Date: 08/21/2021 PCP: Gregory Haven, MD   Assessment & Plan:  Chief Complaint:  Chief Complaint  Patient presents with   Left Knee - Follow-up   Visit Diagnoses:  1. Status post left knee replacement     Plan: [facility] is 6 weeks status post left total knee on 07/09/2021.  Range of motion is improving.  Continues to have problems with pain.  Examination left knee shows a fully healed surgical scar.  He has really good range of motion.  Stable to varus valgus.  Expected postoperative swelling.  Negative Homans.  He is able to squat with his body weight and can walk without any assistance.  Greg[facility] is improving quite well from my standpoint.  He does have chronic pain history and has had some issues with this.  We will refill his Norco to help him with nighttime pain.  Dental prophylaxis reinforced.  Recheck in 6 weeks.  Follow-Up Instructions: Return in about 6 weeks (around 10/02/2021).   Orders:  Orders Placed This Encounter  Procedures   XR Knee 1-2 Views Left   Meds ordered this encounter  Medications   HYDROcodone-acetaminophen (NORCO) 5-325 MG tablet    Sig: Take 1 tablet by mouth daily as needed.    Dispense:  30 tablet    Refill:  0    Imaging: XR Knee 1-2 Views Left  Result Date: 08/21/2021 Stable total knee replacement in good alignment.    PMFS History: Patient Active Problem List   Diagnosis Date Noted   Lumbar disc prolapse with compression radiculopathy 07/11/2021   Triceps strain 07/11/2021   Status post total left knee replacement 07/09/2021   DJD (degenerative joint disease) of knee 07/09/2021   Primary osteoarthritis of left knee 05/03/2021   Colon cancer screening    Polyp of colon    Musculoskeletal chest pain 10/31/2020   Fall 10/31/2020   Muscle cramps 07/27/2020   Positive ANA (antinuclear antibody) 06/09/2020   Left knee  pain 04/19/2020   Biceps tendon tear 04/19/2020   Right shoulder pain 04/19/2020   Arthralgia 04/19/2020   Overweight 05/28/2019   TMJ dysfunction 08/13/2017   Right wrist pain 02/03/2017   Hypersomnia 04/22/2016   Rash and nonspecific skin eruption 04/15/2016   Leg cramps 04/15/2016   History of herpes genitalis 12/14/2014   Skin lesion 04/14/2013   Chronic low back pain 04/14/2013   Leukocytosis 08/04/2012   Generalized anxiety disorder 08/04/2012   Hypogonadism male 10/24/2011   Hypertension 09/24/2011   GERD (gastroesophageal reflux disease) 09/24/2011   Erectile dysfunction 09/24/2011   Past Medical History:  Diagnosis Date   Anxiety    Bronchitis    GERD (gastroesophageal reflux disease)    Hypertension    Insomnia    Low testosterone    Osteoarthritis     Family History  Problem Relation Age of Onset   Lung cancer Father        deceased   Parkinson's disease Mother     Past Surgical History:  Procedure Laterality Date   COLONOSCOPY WITH PROPOFOL N/A 01/24/2021   Procedure: COLONOSCOPY WITH PROPOFOL;  Surgeon: Lin Landsman, MD;  Location: [facility];  Service: Gastroenterology;  Laterality: N/A;   NASAL SINUS SURGERY     Dr. Carlis Abbott   TOTAL KNEE  ARTHROPLASTY Left 07/09/2021   Procedure: LEFT TOTAL KNEE ARTHROPLASTY;  Surgeon: Leandrew Koyanagi, MD;  Location: [facility];  Service: Orthopedics;  Laterality: Left;   Unremarkable     WRIST SURGERY Right    Social History   Occupational History   Not on file  Tobacco Use   Smoking status: Former    Types: Cigarettes    Quit date: 2010    Years since quitting: 13.6   Smokeless tobacco: Never   Tobacco comments:    smoked age [age] stopped then age 75 x 1 years max 1/2 ppd   Vaping Use   Vaping Use: Never used  Substance and Sexual Activity   Alcohol use: No    Alcohol/week: 0.0 standard drinks of alcohol   Drug use: No   Sexual activity: Yes

## 2021-08-22 ENCOUNTER — Other Ambulatory Visit: Payer: Self-pay | Admitting: Family Medicine

## 2021-08-22 DIAGNOSIS — F411 Generalized anxiety disorder: Secondary | ICD-10-CM

## 2021-08-23 ENCOUNTER — Ambulatory Visit: Payer: Medicare Other

## 2021-08-24 ENCOUNTER — Ambulatory Visit: Payer: Medicare Other

## 2021-08-24 DIAGNOSIS — R262 Difficulty in walking, not elsewhere classified: Secondary | ICD-10-CM

## 2021-08-24 DIAGNOSIS — M25662 Stiffness of left knee, not elsewhere classified: Secondary | ICD-10-CM

## 2021-08-24 NOTE — Therapy (Signed)
OUTPATIENT PHYSICAL THERAPY TREATMENT NOTE   Patient Name: Gregory MAZZOCCO Sr. MRN: [id number] DOB:1965-08-30, 56 y.o., male Today's Date: 08/25/2021  PCP:   Leone Haven, MD   REFERRING PROVIDER:  Leandrew Koyanagi, MD   PT End of Session - 08/24/21 [phone]     Visit Number 8    Number of Visits 17    Date for PT Re-Evaluation 10/18/21    Authorization Type Medicare (traditional)    Authorization Time Period 07/26/21-10/18/21    Progress Note Due on Visit 10    PT Start Time 0926    PT Stop Time 1014    PT Time Calculation (min) 48 min    Equipment Utilized During Treatment Gait belt    Activity Tolerance Patient tolerated treatment well;No increased pain    Behavior During Therapy WFL for tasks assessed/performed              Past Medical History:  Diagnosis Date   Anxiety    Bronchitis    GERD (gastroesophageal reflux disease)    Hypertension    Insomnia    Low testosterone    Osteoarthritis    Past Surgical History:  Procedure Laterality Date   COLONOSCOPY WITH PROPOFOL N/A 01/24/2021   Procedure: COLONOSCOPY WITH PROPOFOL;  Surgeon: Lin Landsman, MD;  Location: [facility];  Service: Gastroenterology;  Laterality: N/A;   NASAL SINUS SURGERY     Dr. Carlis Abbott   TOTAL KNEE ARTHROPLASTY Left 07/09/2021   Procedure: LEFT TOTAL KNEE ARTHROPLASTY;  Surgeon: Leandrew Koyanagi, MD;  Location: [place];  Service: Orthopedics;  Laterality: Left;   Unremarkable     WRIST SURGERY Right    Patient Active Problem List   Diagnosis Date Noted   Lumbar disc prolapse with compression radiculopathy 07/11/2021   Triceps strain 07/11/2021   Status post total left knee replacement 07/09/2021   DJD (degenerative joint disease) of knee 07/09/2021   Primary osteoarthritis of left knee 05/03/2021   Colon cancer screening    Polyp of colon    Musculoskeletal chest pain 10/31/2020   Fall 10/31/2020   Muscle cramps 07/27/2020   Positive ANA (antinuclear antibody) 06/09/2020    Left knee pain 04/19/2020   Biceps tendon tear 04/19/2020   Right shoulder pain 04/19/2020   Arthralgia 04/19/2020   Overweight 05/28/2019   TMJ dysfunction 08/13/2017   Right wrist pain 02/03/2017   Hypersomnia 04/22/2016   Rash and nonspecific skin eruption 04/15/2016   Leg cramps 04/15/2016   History of herpes genitalis 12/14/2014   Skin lesion 04/14/2013   Chronic low back pain 04/14/2013   Leukocytosis 08/04/2012   Generalized anxiety disorder 08/04/2012   Hypogonadism male 10/24/2011   Hypertension 09/24/2011   GERD (gastroesophageal reflux disease) 09/24/2011   Erectile dysfunction 09/24/2011    REFERRING DIAG: M17.12 (ICD-10-CM) - Primary osteoarthritis of left knee  THERAPY DIAG:  Stiffness of left knee, not elsewhere classified  Difficulty in walking, not elsewhere classified  Rationale for Evaluation and Treatment Rehabilitation  PERTINENT HISTORY: Gregory Swart Sr. is a 34 yoM who presents to [facility] 07/26/21 for rehabilization of Left TKA (07/08/21). Pt inititally had some pain management difficulty first postop week, but is improved now. He has been DC from [place] and has been working on his DC HEP.   PRECAUTIONS: TKE  SUBJECTIVE: Patient reports doing well - denies any pain currently but reports some intermittent left knee pain. Reports had a good check up with his ortho MD.  PAIN:  Are  you having pain? Yes: NPRS scale: 0/10 Pain location: L knee  Pain description: constant Aggravating factors: surgery  Relieving factors: ice, meds      TODAY'S TREATMENT:  08/24/2021   INTERVENTION:  5 min nustep level 0 x 2 min and 3 min at level 2 a seat level 10  and LE only x 5 min for knee ROM/Muscle strength. Patient denied any pain with activity.  Manual Therapy:  PROM to left knee to focus on knee flex and extension Seated grade 2-3 tibiofemoral joint glides (AP/PA) x several min in varying deg of knee flex Patellar mobs (sup/inf/med/lat)  Left knee Flexion AROM  after manual therapy = 120 deg    THEREX - Patient instructed in and performed standing knee flex stretch and knee ext stretch x 30 sec x 4 sets each at 2nd step with B rails. VC for posture and correct technique.  - Patient instructed in seated knee flex and ext stretch x 30 sec x 4 sets each. Patient reported feeling much better and more flexible. Reviewed and emphasized importance of performing for HEP.  -Forward step ups x 15 leading with left LE for increased strength/power in left LE -Lateral step ups x 15 reps -Eccentric step down x 15 reps - VC to keep patient focused as he was very loquacious today throughout session.           PATIENT EDUCATION: Education details: Pt educated throughout session about proper posture and technique with exercises. Improved exercise technique, movement at target joints, use of target muscles after min to mod verbal, visual, tactile cues.  Person educated: Patient Education method: Explanation Education comprehension: verbalized understanding   HOME EXERCISE PROGRAM: [id number]   PT Short Term Goals -       PT SHORT TERM GOAL #1   Title Pt to demosntrate Left knee flexion ROM 10-100 degrees.    Baseline Eval: 9-75 Left knee flexion    Time 3    Period Weeks    Status New    Target Date 08/16/21      PT SHORT TERM GOAL #2   Title Pt to demonstrate performance of 877f AMB asisstive device ad lib without loss of symetry and without progression of antalgia, gait speed >1.090m.    Baseline Eval: 30057fPC for part, 0.76m50mminimal antalgia    Time 4    Period Weeks    Status New    Target Date 08/23/21      PT SHORT TERM GOAL #3   Title 5xSTS <16sec hands-free from standard chair height    Baseline eval: needs elevation of seat and hands on knees for 25sec    Time 4    Period Weeks    Status New    Target Date 08/23/21              PT Long Term Goals -      PT LONG TERM GOAL #1   Title FOTO score improvement >14 points  to indicate reduced difficulty in lifting groceries, light household actiivties, heavy household activities, typical housework, school work, or hobbies.    Baseline Eval: 40    Time 8    Period Weeks    Status New    Target Date 09/20/21      PT LONG TERM GOAL #2   Title Pt to improve tolerance of comfortable sitting to >2 hours to be able to go see a movie with family.    Baseline Eval: 3 minutes  Time 8    Period Weeks    Status New    Target Date 09/20/21      PT LONG TERM GOAL #3   Title Pt to demonstrate Lt knee flexion ROM <10->120 degrees to facilitate tolerance of basic sitting postures required for ADL, IADL.    Baseline eval: 9-75 degrees    Time 8    Period Weeks    Status New    Target Date 09/20/21      PT LONG TERM GOAL #4   Title Pt to demonstrate tolerance of 6MWT >1524f with good symetry, no LOB, AD ad lib, no antalgia.    Baseline eval: 3023f   Time 8    Period Weeks    Status New    Target Date 09/20/21      PT LONG TERM GOAL #5   Title 5xSTS< 12sec hands free, standard surface height to improve tolerance of typical daily mobility needs.    Baseline *see ST goals    Time 10    Period Weeks    Status New    Target Date 10/04/21              Plan -    Clinical Impression Statement Continued with current plan of care as laid out in evaluation and recent prior sessions. Treatment focused on ROM today and discussion of what patient is doing at home. Emphasized importance of HEP- ROM/Stretching and spent increased time going over seated and standing therex to improve his ROM. He was initially tight at 110 deg upon arrival yet responded well to Nustep and manual therapy improving to 120 upon completion. Patient will continue to benefit from skilled PT services to improve his overall knee ROM/pain relief, strength and promote return to previous level of function.   Personal Factors and Comorbidities Behavior Pattern;Fitness;Time since onset of  injury/illness/exacerbation    Examination-Activity Limitations Carry;Locomotion Level;Squat;Stand;Transfers    Examination-Participation Restrictions Driving;Interpersonal Relationship;Yard Work;Community Activity;Cleaning;Laundry    Stability/Clinical Decision Making Stable/Uncomplicated    Rehab Potential Excellent    PT Frequency 2x / week    PT Duration 12 weeks    PT Treatment/Interventions ADLs/Self Care Home Management;Electrical Stimulation;Moist Heat;Cryotherapy;DME Instruction;Gait training;Stair training;Functional mobility training;Therapeutic activities;Therapeutic exercise;Balance training;Neuromuscular re-education;Patient/family education;Prosthetic Training;Compression bandaging;Scar mobilization;Passive range of motion;Dry needling    PT Next Visit Plan Promote activity modiciation, compliance with HEP updates, ESTIM and ICE to facilitate tolerance and quads lag, ROM    PT Home Exercise Plan eval: reviewed quad set, SLR, SAQ (recommended elevated bolster to 45 degree start), added in hooklying bridge as new one. Did not have time to review all others    Consulted and Agree with Plan of Care Patient               JeLewis MoccasinPT 08/25/2021, 7:37 AM

## 2021-08-28 ENCOUNTER — Ambulatory Visit: Payer: Medicare Other

## 2021-08-28 ENCOUNTER — Encounter: Payer: Self-pay | Admitting: Physical Therapy

## 2021-08-28 DIAGNOSIS — M25662 Stiffness of left knee, not elsewhere classified: Secondary | ICD-10-CM

## 2021-08-28 DIAGNOSIS — R262 Difficulty in walking, not elsewhere classified: Secondary | ICD-10-CM

## 2021-08-28 NOTE — Therapy (Signed)
OUTPATIENT PHYSICAL THERAPY TREATMENT NOTE   Patient Name: Gregory DIEMER Sr. MRN: [id number] DOB:Oct 06, 1965, 56 y.o., male Today's Date: 08/28/2021  PCP:   Gregory Haven, MD   REFERRING PROVIDER:  Leandrew Koyanagi, MD   PT End of Session - 08/28/21 1020     Visit Number 9    Number of Visits 17    Date for PT Re-Evaluation 10/18/21    Authorization Type Medicare (traditional)    Authorization Time Period 07/26/21-10/18/21    Progress Note Due on Visit 10    PT Start Time 1017    PT Stop Time 1057    PT Time Calculation (min) 40 min    Equipment Utilized During Treatment Gait belt    Activity Tolerance Patient tolerated treatment well;No increased pain    Behavior During Therapy WFL for tasks assessed/performed              Past Medical History:  Diagnosis Date   Anxiety    Bronchitis    GERD (gastroesophageal reflux disease)    Hypertension    Insomnia    Low testosterone    Osteoarthritis    Past Surgical History:  Procedure Laterality Date   COLONOSCOPY WITH PROPOFOL N/A 01/24/2021   Procedure: COLONOSCOPY WITH PROPOFOL;  Surgeon: Gregory Landsman, MD;  Location: [facility];  Service: Gastroenterology;  Laterality: N/A;   NASAL SINUS SURGERY     Dr. Carlis Stokes   TOTAL KNEE ARTHROPLASTY Left 07/09/2021   Procedure: LEFT TOTAL KNEE ARTHROPLASTY;  Surgeon: Gregory Koyanagi, MD;  Location: [place];  Service: Orthopedics;  Laterality: Left;   Unremarkable     WRIST SURGERY Right    Patient Active Problem List   Diagnosis Date Noted   Lumbar disc prolapse with compression radiculopathy 07/11/2021   Triceps strain 07/11/2021   Status post total left knee replacement 07/09/2021   DJD (degenerative joint disease) of knee 07/09/2021   Primary osteoarthritis of left knee 05/03/2021   Colon cancer screening    Polyp of colon    Musculoskeletal chest pain 10/31/2020   Fall 10/31/2020   Muscle cramps 07/27/2020   Positive ANA (antinuclear antibody) 06/09/2020    Left knee pain 04/19/2020   Biceps tendon tear 04/19/2020   Right shoulder pain 04/19/2020   Arthralgia 04/19/2020   Overweight 05/28/2019   TMJ dysfunction 08/13/2017   Right wrist pain 02/03/2017   Hypersomnia 04/22/2016   Rash and nonspecific skin eruption 04/15/2016   Leg cramps 04/15/2016   History of herpes genitalis 12/14/2014   Skin lesion 04/14/2013   Chronic low back pain 04/14/2013   Leukocytosis 08/04/2012   Generalized anxiety disorder 08/04/2012   Hypogonadism male 10/24/2011   Hypertension 09/24/2011   GERD (gastroesophageal reflux disease) 09/24/2011   Erectile dysfunction 09/24/2011    REFERRING DIAG: M17.12 (ICD-10-CM) - Primary osteoarthritis of left knee  THERAPY DIAG:  Stiffness of left knee, not elsewhere classified  Difficulty in walking, not elsewhere classified  Rationale for Evaluation and Treatment Rehabilitation  PERTINENT HISTORY: Gregory Sunday Sr. is a 85 yoM who presents to [facility] 07/26/21 for rehabilization of Left TKA (07/08/21). Pt inititally had some pain management difficulty first postop week, but is improved now. He has been DC from [place] and has been working on his DC HEP.   PRECAUTIONS: TKE  SUBJECTIVE: Pt reports 2-3/10 pain in L knee. Still having some sharp lingering pain under knee cap. Reports feels like fluid is in his knee. Worse in the afternoons.  PAIN:  Are you having pain? Yes: NPRS scale: 2-3/10 Pain location: L knee  Pain description: constant Aggravating factors: surgery  Relieving factors: ice, meds      TODAY'S TREATMENT: 08/28/2021  There.ex:  Nu-Step L2, seat level 9 for 3 minutes then seat level 8 for 3 minutes to improve knee flexion AROM. Level 5 for last minute for increased resistance. 6 minutes total.  Alternating lunges on 6" step with BUE support for deep knee flexion AROM and strengthening:  LLE, 3x8, 5 sec holds  Wall squats with back on blue physioball:  3x10, use of mirror for visual cue as pt weight  shifts onto RLE. Improved equal WB'ing with mirror. VC's for eccentric control   Heel raises with heels over 6" step: 3x20, SBA. In front of // bars incase UE support needed for balance.   Backwards walking at matrix for glut and quad strengthening: VC's for heel strike with forwards walking with eccentric control for knee control and TKE. CGA throughout. 1x10 d/b at 17.5 lbs. Reciprocal step through gait backwards as reps progress  Hook lying hamstring curl + bridge on blue physioball: 3x8 reps. Mod VC's for form/technique. Improves by last set        PATIENT EDUCATION: Education details: Pt educated throughout session about proper posture and technique with exercises. Improved exercise technique, movement at target joints, use of target muscles after min to mod verbal, visual, tactile cues.  Person educated: Patient Education method: Explanation Education comprehension: verbalized understanding   HOME EXERCISE PROGRAM: [id number]   PT Short Term Goals -       PT SHORT TERM GOAL #1   Title Pt to demosntrate Left knee flexion ROM 10-100 degrees.    Baseline Eval: 9-75 Left knee flexion    Time 3    Period Weeks    Status New    Target Date 08/16/21      PT SHORT TERM GOAL #2   Title Pt to demonstrate performance of 853f AMB asisstive device ad lib without loss of symetry and without progression of antalgia, gait speed >1.04m.    Baseline Eval: 30061fPC for part, 0.72m36mminimal antalgia    Time 4    Period Weeks    Status New    Target Date 08/23/21      PT SHORT TERM GOAL #3   Title 5xSTS <16sec hands-free from standard chair height    Baseline eval: needs elevation of seat and hands on knees for 25sec    Time 4    Period Weeks    Status New    Target Date 08/23/21              PT Long Term Goals -      PT LONG TERM GOAL #1   Title FOTO score improvement >14 points to indicate reduced difficulty in lifting groceries, light household actiivties, heavy  household activities, typical housework, school work, or hobbies.    Baseline Eval: 40    Time 8    Period Weeks    Status New    Target Date 09/20/21      PT LONG TERM GOAL #2   Title Pt to improve tolerance of comfortable sitting to >2 hours to be able to go see a movie with family.    Baseline Eval: 3 minutes    Time 8    Period Weeks    Status New    Target Date 09/20/21      PT LONG TERM  GOAL #3   Title Pt to demonstrate Lt knee flexion ROM <10->120 degrees to facilitate tolerance of basic sitting postures required for ADL, IADL.    Baseline eval: 9-75 degrees    Time 8    Period Weeks    Status New    Target Date 09/20/21      PT LONG TERM GOAL #4   Title Pt to demonstrate tolerance of 6MWT >1541f with good symetry, no LOB, AD ad lib, no antalgia.    Baseline eval: 3021f   Time 8    Period Weeks    Status New    Target Date 09/20/21      PT LONG TERM GOAL #5   Title 5xSTS< 12sec hands free, standard surface height to improve tolerance of typical daily mobility needs.    Baseline *see ST goals    Time 10    Period Weeks    Status New    Target Date 10/04/21              Plan -    Clinical Impression Statement Pt tolerating progression of compound exercises well without aggravation of L knee pain. Pt remains highly motivated throughout session needing little rest breaks throughout. Pt improves with form/technique with exercises as reps progress. Pt does rely on R weight shift to offload surgical limb requiring mod VC's and mirror feedback to improve LLE weightbearing. Pt will need progress note next visit to assess progress towards all goals.    Personal Factors and Comorbidities Behavior Pattern;Fitness;Time since onset of injury/illness/exacerbation    Examination-Activity Limitations Carry;Locomotion Level;Squat;Stand;Transfers    Examination-Participation Restrictions Driving;Interpersonal Relationship;Yard Work;Community Activity;Cleaning;Laundry     Stability/Clinical Decision Making Stable/Uncomplicated    Rehab Potential Excellent    PT Frequency 2x / week    PT Duration 12 weeks    PT Treatment/Interventions ADLs/Self Care Home Management;Electrical Stimulation;Moist Heat;Cryotherapy;DME Instruction;Gait training;Stair training;Functional mobility training;Therapeutic activities;Therapeutic exercise;Balance training;Neuromuscular re-education;Patient/family education;Prosthetic Training;Compression bandaging;Scar mobilization;Passive range of motion;Dry needling    PT Next Visit Plan Promote activity modiciation, compliance with HEP updates, ESTIM and ICE to facilitate tolerance and quads lag, ROM    PT Home Exercise Plan eval: reviewed quad set, SLR, SAQ (recommended elevated bolster to 45 degree start), added in hooklying bridge as new one. Did not have time to review all others    Consulted and Agree with Plan of Care Patient               MiSalem CasterFairly IV, PT, DPT Physical Therapist- CoAmherstdale Medical Center8/22/2023, 10:58 AM

## 2021-08-30 ENCOUNTER — Ambulatory Visit: Payer: Medicare Other | Admitting: Physical Therapy

## 2021-08-31 ENCOUNTER — Ambulatory Visit: Payer: Medicare Other | Admitting: Physical Therapy

## 2021-09-04 ENCOUNTER — Ambulatory Visit: Payer: Medicare Other

## 2021-09-04 DIAGNOSIS — R262 Difficulty in walking, not elsewhere classified: Secondary | ICD-10-CM

## 2021-09-04 DIAGNOSIS — M25662 Stiffness of left knee, not elsewhere classified: Secondary | ICD-10-CM

## 2021-09-04 NOTE — Therapy (Signed)
OUTPATIENT PHYSICAL THERAPY TREATMENT and REASSESSMENT NOTE  Reporting dates 07/26/21-09/04/21   Patient Name: Gregory DOWNIE Sr. MRN: [id number] DOB:April 07, 1965, 56 y.o., male Today's Date: 09/04/2021  PCP:   Leone Haven, MD   REFERRING PROVIDER:  Leandrew Koyanagi, MD   PT End of Session - 09/04/21 [id number]     Visit Number 10    Number of Visits 17    Date for PT Re-Evaluation 10/18/21    Authorization Type Medicare (traditional)    Authorization Time Period 07/26/21-10/18/21    Progress Note Due on Visit 10    PT Start Time 0803    PT Stop Time 0842    PT Time Calculation (min) 39 min    Equipment Utilized During Treatment Gait belt    Activity Tolerance Patient tolerated treatment well;No increased pain    Behavior During Therapy WFL for tasks assessed/performed              Past Medical History:  Diagnosis Date   Anxiety    Bronchitis    GERD (gastroesophageal reflux disease)    Hypertension    Insomnia    Low testosterone    Osteoarthritis    Past Surgical History:  Procedure Laterality Date   COLONOSCOPY WITH PROPOFOL N/A 01/24/2021   Procedure: COLONOSCOPY WITH PROPOFOL;  Surgeon: Lin Landsman, MD;  Location: [facility] ENDOSCOPY;  Service: Gastroenterology;  Laterality: N/A;   NASAL SINUS SURGERY     Dr. Carlis Abbott   TOTAL KNEE ARTHROPLASTY Left 07/09/2021   Procedure: LEFT TOTAL KNEE ARTHROPLASTY;  Surgeon: Leandrew Koyanagi, MD;  Location: [place];  Service: Orthopedics;  Laterality: Left;   Unremarkable     WRIST SURGERY Right    Patient Active Problem List   Diagnosis Date Noted   Lumbar disc prolapse with compression radiculopathy 07/11/2021   Triceps strain 07/11/2021   Status post total left knee replacement 07/09/2021   DJD (degenerative joint disease) of knee 07/09/2021   Primary osteoarthritis of left knee 05/03/2021   Colon cancer screening    Polyp of colon    Musculoskeletal chest pain 10/31/2020   Fall 10/31/2020   Muscle cramps 07/27/2020    Positive ANA (antinuclear antibody) 06/09/2020   Left knee pain 04/19/2020   Biceps tendon tear 04/19/2020   Right shoulder pain 04/19/2020   Arthralgia 04/19/2020   Overweight 05/28/2019   TMJ dysfunction 08/13/2017   Right wrist pain 02/03/2017   Hypersomnia 04/22/2016   Rash and nonspecific skin eruption 04/15/2016   Leg cramps 04/15/2016   History of herpes genitalis 12/14/2014   Skin lesion 04/14/2013   Chronic low back pain 04/14/2013   Leukocytosis 08/04/2012   Generalized anxiety disorder 08/04/2012   Hypogonadism male 10/24/2011   Hypertension 09/24/2011   GERD (gastroesophageal reflux disease) 09/24/2011   Erectile dysfunction 09/24/2011    REFERRING DIAG: M17.12 (ICD-10-CM) - Primary osteoarthritis of left knee  THERAPY DIAG:  Stiffness of left knee, not elsewhere classified  Difficulty in walking, not elsewhere classified  Rationale for Evaluation and Treatment Rehabilitation  PERTINENT HISTORY: Gregory Belue Sr. is a 77 yoM who presents to [facility] 07/26/21 for rehabilization of Left TKA (07/08/21). Pt inititally had some pain management difficulty first postop week, but is improved now. He has been DC from [place] and has been working on his DC HEP.   PRECAUTIONS: TKE  SUBJECTIVE: Pt reports 2/10 pain in L knee. Pt says he feels like he 'tore his calf' as a result of last session after extensive  heel raises. He denies any bruising thereafter, reports it's feeling better. He wants to take it easy. Pt has been exploring advanced exercises at home having moved beyond the original list. Pt not taking any pain meds any more, but occasionally takes tylenol before bed. Pt is well aware of the progress he has made and efforts puth forth, although he remains anxious about details on the coming months and what his knee and activity should be like.    PAIN:  Are you having pain? Yes: NPRS scale: 2-3/10 Pain location: L knee  Pain description: intermittent Aggravating factors:  walking, too much stretching or exercises, sleeping (sometimes)   Relieving factors: ice, meds      TODAY'S TREATMENT 09/04/21 :  Reassessment Measures:  FOTO survery: 62 6MWT: 1456f, no device, minimal asymmetry with excessive knee flexion moment on nonsurgical side from initial contact to midstance, this absent on surgical side. No dramatic deterioration of gait quality of 6 minutes of walking.  5xSTS: <10sec hands free  Knee ROM: Knee flexion ROM 125 degrees  Education on activity modification at home: promoted continued ad lib practice of reiprocal gait training on stairs and flexion stretching during the day; encouraged to take rest days , encouraged more sustained walking efforts of 4-5 minutes.   Pt not really performing his formal HEP anymore, but would benefit from more structured updates to compliment what he has gravitated to recently on his own.        PATIENT EDUCATION: Education details: results of retesting  Person educated: Patient Education method: Explanation Education comprehension: verbalized understanding   HOME EXERCISE PROGRAM: [email or web address]*Please update next visit (#11)    PT Short Term Goals -       PT SHORT TERM GOAL #1   Title Pt to demosntrate Left knee flexion ROM 10-100 degrees.    Baseline Eval: 9-75 Left knee flexion; 09/04/21: 125 degrees flexion.   Time 3    Period Weeks    Status Achieved    Target Date 08/16/21      PT SHORT TERM GOAL #2   Title Pt to demonstrate performance of 8075fAMB asisstive device ad lib without loss of symetry and without progression of antalgia, gait speed >1.37m82m    Baseline Eval: 300f40fC for part, 0.27m/77minimal antalgia; 09/04/21: 6mwt:73m00ft  87fme 4    Period Weeks    Status achieved   Target Date 08/23/21      PT SHORT TERM GOAL #3   Title 5xSTS <16sec hands-free from standard chair height    Baseline eval: needs elevation of seat and hands on knees for 25 sec; 09/04/21: 9.22sec arms crossed.     Time 4    Period Weeks    Status Achieved       Target Date 08/23/21              PT Long Term Goals -      PT LONG TERM GOAL #1   Title FOTO score improvement >14 points to indicate reduced difficulty in lifting groceries, light household actiivties, heavy household activities, typical housework, school work, or hobbies.    Baseline Eval: 40; 09/04/21: 62   Time 8    Period Weeks    Status Achieved      Target Date 09/20/21      PT LONG TERM GOAL #2   Title Pt to improve tolerance of comfortable sitting to >2 hours to be able to go see a movie with family.  Baseline Eval: 3 minutes; 8/29: could make it 2 hours with weight shifting.    Time 8    Period Weeks    Status Achieved     Target Date 09/20/21      PT LONG TERM GOAL #3   Title Pt to demonstrate Lt knee flexion ROM <10->120 degrees to facilitate tolerance of basic sitting postures required for ADL, IADL.    Baseline eval: 9-75 degrees; 09/04/21: 125    Time 8    Period Weeks    Status Achieved     Target Date 09/20/21      PT LONG TERM GOAL #4   Title Pt to demonstrate tolerance of 6MWT >1539f with good symetry, no LOB, AD ad lib, no antalgia.    Baseline eval: 3044f 8/29: 14389f Time 8    Period Weeks    Status On going     Target Date 09/20/21      PT LONG TERM GOAL #5   Title 5xSTS< 12sec hands free, standard surface height to improve tolerance of typical daily mobility needs.    Baseline 8/29 <10 sec hands free    Time 10    Period Weeks    Status Achieved     Target Date 10/04/21              Plan -    Clinical Impression Statement Reassessment showing progress toward goals ahead of projections. Many ST and LT goals already achieved, ROM looking very good, however pt stil not able to use leg symmetrically without pain. Pt has not returned to ad lib community distance AMB but is now at a the stage where he can focus on this more. Will plan on more sensitive measure of quads strength next  date to determine remaining deficits that warrant attention to facilitate return to full recovery. Pt progressing well overall.    Personal Factors and Comorbidities Behavior Pattern;Fitness;Time since onset of injury/illness/exacerbation    Examination-Activity Limitations Carry;Locomotion Level;Squat;Stand;Transfers    Examination-Participation Restrictions Driving;Interpersonal Relationship;Yard Work;Community Activity;Cleaning;Laundry    Stability/Clinical Decision Making Stable/Uncomplicated    Rehab Potential Excellent    PT Frequency 2x / week    PT Duration 12 weeks    PT Treatment/Interventions ADLs/Self Care Home Management;Electrical Stimulation;Moist Heat;Cryotherapy;DME Instruction;Gait training;Stair training;Functional mobility training;Therapeutic activities;Therapeutic exercise;Balance training;Neuromuscular re-education;Patient/family education;Prosthetic Training;Compression bandaging;Scar mobilization;Passive range of motion;Dry needling    PT Next Visit Plan ...   PT Home Exercise Plan eval: reviewed quad set, SLR, SAQ (recommended elevated bolster to 45 degree start), added in hooklying bridge as new one. Did not have time to review all others    Consulted and Agree with Plan of Care Patient             8:15 AM, 09/04/21 AllEtta GrandchildT, DPT Physical Therapist - [phone]  09/04/2021, 8:14 AM

## 2021-09-06 ENCOUNTER — Ambulatory Visit: Payer: Medicare Other

## 2021-09-06 DIAGNOSIS — M6281 Muscle weakness (generalized): Secondary | ICD-10-CM

## 2021-09-06 DIAGNOSIS — M25662 Stiffness of left knee, not elsewhere classified: Secondary | ICD-10-CM | POA: Diagnosis not present

## 2021-09-06 NOTE — Therapy (Signed)
OUTPATIENT PHYSICAL THERAPY TREATMENT   Patient Name: Gregory GUTHRIE Sr. MRN: [id number] DOB:Nov 06, 1965, 56 y.o., male Today's Date: 09/06/2021  PCP:   Leone Haven, MD   REFERRING PROVIDER:  Leandrew Koyanagi, MD   PT End of Session - 09/06/21 1646     Visit Number 11    Number of Visits 17    Date for PT Re-Evaluation 10/18/21    Authorization Type Medicare (traditional)    Authorization Time Period 07/26/21-10/18/21    Progress Note Due on Visit 10    PT Start Time 0803    PT Stop Time 0843    PT Time Calculation (min) 40 min    Equipment Utilized During Treatment Gait belt    Activity Tolerance Patient tolerated treatment well;No increased pain    Behavior During Therapy [facility] for tasks assessed/performed;Impulsive               Past Medical History:  Diagnosis Date   Anxiety    Bronchitis    GERD (gastroesophageal reflux disease)    Hypertension    Insomnia    Low testosterone    Osteoarthritis    Past Surgical History:  Procedure Laterality Date   COLONOSCOPY WITH PROPOFOL N/A 01/24/2021   Procedure: COLONOSCOPY WITH PROPOFOL;  Surgeon: Lin Landsman, MD;  Location: [facility] ENDOSCOPY;  Service: Gastroenterology;  Laterality: N/A;   NASAL SINUS SURGERY     Dr. Carlis Abbott   TOTAL KNEE ARTHROPLASTY Left 07/09/2021   Procedure: LEFT TOTAL KNEE ARTHROPLASTY;  Surgeon: Leandrew Koyanagi, MD;  Location: [place];  Service: Orthopedics;  Laterality: Left;   Unremarkable     WRIST SURGERY Right    Patient Active Problem List   Diagnosis Date Noted   Lumbar disc prolapse with compression radiculopathy 07/11/2021   Triceps strain 07/11/2021   Status post total left knee replacement 07/09/2021   DJD (degenerative joint disease) of knee 07/09/2021   Primary osteoarthritis of left knee 05/03/2021   Colon cancer screening    Polyp of colon    Musculoskeletal chest pain 10/31/2020   Fall 10/31/2020   Muscle cramps 07/27/2020   Positive ANA (antinuclear antibody)  06/09/2020   Left knee pain 04/19/2020   Biceps tendon tear 04/19/2020   Right shoulder pain 04/19/2020   Arthralgia 04/19/2020   Overweight 05/28/2019   TMJ dysfunction 08/13/2017   Right wrist pain 02/03/2017   Hypersomnia 04/22/2016   Rash and nonspecific skin eruption 04/15/2016   Leg cramps 04/15/2016   History of herpes genitalis 12/14/2014   Skin lesion 04/14/2013   Chronic low back pain 04/14/2013   Leukocytosis 08/04/2012   Generalized anxiety disorder 08/04/2012   Hypogonadism male 10/24/2011   Hypertension 09/24/2011   GERD (gastroesophageal reflux disease) 09/24/2011   Erectile dysfunction 09/24/2011    REFERRING DIAG: M17.12 (ICD-10-CM) - Primary osteoarthritis of left knee  THERAPY DIAG:  Stiffness of left knee, not elsewhere classified  Muscle weakness (generalized)  Rationale for Evaluation and Treatment Rehabilitation  PERTINENT HISTORY: Gregory Morison Sr. is a 70 yoM who presents to [facility] 07/26/21 for rehabilization of Left TKA (07/08/21). Pt inititally had some pain management difficulty first postop week, but is improved now. He has been DC from [place] and has been working on his DC HEP.   PRECAUTIONS: TKE  SUBJECTIVE: Pt reports no pain at the moment. Pt reports his knee occ buckles. He sates, "I want more motion." Reports he thinks physioball hamstring curls have irritated his sciatica and asks if he  can do another exercise instead. Pt reports he is caffeinated this morning.  PAIN:  Are you having pain? Yes: NPRS scale: 2-3/10 Pain location: L knee  Pain description: intermittent Aggravating factors: walking, too much stretching or exercises, sleeping (sometimes)   Relieving factors: ice, meds      TODAY'S TREATMENT 09/06/21 :   There.ex:   PT provide education on what to expect at this phase of rehab process with pain and mobility.   On plinth: SAQ - LLE 20x Not fatiguing  Heel slides LLE 10x 3 sets. Achieves 120 deg. Knee flexion.   Provided  further education in progressive walking benefits, intervals, frequency.  Nu-Step LVL0-LVL6 x 6 minutes. PT monitors pt for response throughout, increases and decreases intensity as appropriate in order to achieve therapeutic effect. Cuing throughout for pt to attend to task and for intensity and technique with intervention.  In // bars: Mini squats 10x2 sets. Rates easy   Static lunges 2x10 each LE  Walking lunges 8x length of bars.  Cuing throughout for technique/to attend to task.  Pt reports no pain at end of session.     PATIENT EDUCATION: Education details:Pt educated throughout session about proper posture and technique with exercises. Improved exercise technique, movement at target joints, use of target muscles after min to mod verbal, visual, tactile cues.  Person educated: Patient Education method: Explanation, Demonstration, Tactile cues, and Verbal cues Education comprehension: verbalized understanding, returned demonstration, verbal cues required, and needs further education   HOME EXERCISE PROGRAM: Pt to continue HEP as previously given [id number] *Please update next visit (#11)    PT Short Term Goals -       PT SHORT TERM GOAL #1   Title Pt to demosntrate Left knee flexion ROM 10-100 degrees.    Baseline Eval: 9-75 Left knee flexion; 09/04/21: 125 degrees flexion.   Time 3    Period Weeks    Status Achieved    Target Date 08/16/21      PT SHORT TERM GOAL #2   Title Pt to demonstrate performance of 858f AMB asisstive device ad lib without loss of symetry and without progression of antalgia, gait speed >1.053m.    Baseline Eval: 30033fPC for part, 0.59m19mminimal antalgia; 09/04/21: 6mwt63m800ft 1fime 4    Period Weeks    Status achieved   Target Date 08/23/21      PT SHORT TERM GOAL #3   Title 5xSTS <16sec hands-free from standard chair height    Baseline eval: needs elevation of seat and hands on knees for 25 sec; 09/04/21: 9.22sec arms crossed.     Time 4    Period Weeks    Status Achieved       Target Date 08/23/21              PT Long Term Goals -      PT LONG TERM GOAL #1   Title FOTO score improvement >14 points to indicate reduced difficulty in lifting groceries, light household actiivties, heavy household activities, typical housework, school work, or hobbies.    Baseline Eval: 40; 09/04/21: 62   Time 8    Period Weeks    Status Achieved      Target Date 09/20/21      PT LONG TERM GOAL #2   Title Pt to improve tolerance of comfortable sitting to >2 hours to be able to go see a movie with family.    Baseline Eval: 3 minutes; 8/29: could  make it 2 hours with weight shifting.    Time 8    Period Weeks    Status Achieved     Target Date 09/20/21      PT LONG TERM GOAL #3   Title Pt to demonstrate Lt knee flexion ROM <10->120 degrees to facilitate tolerance of basic sitting postures required for ADL, IADL.    Baseline eval: 9-75 degrees; 09/04/21: 125    Time 8    Period Weeks    Status Achieved     Target Date 09/20/21      PT LONG TERM GOAL #4   Title Pt to demonstrate tolerance of 6MWT >1562f with good symetry, no LOB, AD ad lib, no antalgia.    Baseline eval: 3048f 8/29: 143868f Time 8    Period Weeks    Status On going     Target Date 09/20/21      PT LONG TERM GOAL #5   Title 5xSTS< 12sec hands free, standard surface height to improve tolerance of typical daily mobility needs.    Baseline 8/29 <10 sec hands free    Time 10    Period Weeks    Status Achieved     Target Date 10/04/21              Plan -    Clinical Impression Statement Pt tolerates all interventions well except for heel slides which are still somewhat pain-limited. However, during session pt did demo deep squat in // bars without pain, but was cued to perform just mini-squats. Ability to further assess quad strength on this date somewhat limited. PT provided multi-modal cues to assist pt with attending to task with correct  technique throughout session and to avoid movements that are potentially to irritating to knee at this phase of rehab process. Pt did report during session he is walking up to 30 minutes at a time without issue. The pt will benefit from further skilled PT to improve pain, strength, ROM and mobility.    Personal Factors and Comorbidities Behavior Pattern;Fitness;Time since onset of injury/illness/exacerbation    Examination-Activity Limitations Carry;Locomotion Level;Squat;Stand;Transfers    Examination-Participation Restrictions Driving;Interpersonal Relationship;Yard Work;Community Activity;Cleaning;Laundry    Stability/Clinical Decision Making Stable/Uncomplicated    Rehab Potential Excellent    PT Frequency 2x / week    PT Duration 12 weeks    PT Treatment/Interventions ADLs/Self Care Home Management;Electrical Stimulation;Moist Heat;Cryotherapy;DME Instruction;Gait training;Stair training;Functional mobility training;Therapeutic activities;Therapeutic exercise;Balance training;Neuromuscular re-education;Patient/family education;Prosthetic Training;Compression bandaging;Scar mobilization;Passive range of motion;Dry needling    PT Next Visit Plan ...   PT Home Exercise Plan eval: reviewed quad set, SLR, SAQ (recommended elevated bolster to 45 degree start), added in hooklying bridge as new one. Did not have time to review all others    Consulted and Agree with Plan of Care Patient             4:59 PM, 09/06/21 HalRicard Dillon, DPT  Physical Therapist - [phone]  09/06/2021, 4:59 PM

## 2021-09-11 ENCOUNTER — Ambulatory Visit: Payer: Medicare Other | Attending: Orthopaedic Surgery

## 2021-09-11 DIAGNOSIS — M25662 Stiffness of left knee, not elsewhere classified: Secondary | ICD-10-CM | POA: Diagnosis present

## 2021-09-11 DIAGNOSIS — R262 Difficulty in walking, not elsewhere classified: Secondary | ICD-10-CM | POA: Diagnosis present

## 2021-09-11 DIAGNOSIS — M6281 Muscle weakness (generalized): Secondary | ICD-10-CM | POA: Insufficient documentation

## 2021-09-11 NOTE — Therapy (Signed)
OUTPATIENT PHYSICAL THERAPY TREATMENT   Patient Name: Gregory SCHARA Sr. MRN: [id number] DOB:May 12, 1965, 56 y.o., male Today's Date: 09/11/2021  PCP:   Leone Haven, MD   REFERRING PROVIDER:  Leandrew Koyanagi, MD   PT End of Session - 09/11/21 1024     Visit Number 12    Number of Visits 17    Date for PT Re-Evaluation 10/18/21    Authorization Type Medicare (traditional)    Authorization Time Period 07/26/21-10/18/21    Progress Note Due on Visit 20    PT Start Time 1015    PT Stop Time 1055    PT Time Calculation (min) 40 min    Activity Tolerance Patient tolerated treatment well    Behavior During Therapy [facility] for tasks assessed/performed;Impulsive               Past Medical History:  Diagnosis Date   Anxiety    Bronchitis    GERD (gastroesophageal reflux disease)    Hypertension    Insomnia    Low testosterone    Osteoarthritis    Past Surgical History:  Procedure Laterality Date   COLONOSCOPY WITH PROPOFOL N/A 01/24/2021   Procedure: COLONOSCOPY WITH PROPOFOL;  Surgeon: Lin Landsman, MD;  Location: [facility] ENDOSCOPY;  Service: Gastroenterology;  Laterality: N/A;   NASAL SINUS SURGERY     Dr. Carlis Abbott   TOTAL KNEE ARTHROPLASTY Left 07/09/2021   Procedure: LEFT TOTAL KNEE ARTHROPLASTY;  Surgeon: Leandrew Koyanagi, MD;  Location: [facility];  Service: Orthopedics;  Laterality: Left;   Unremarkable     WRIST SURGERY Right    Patient Active Problem List   Diagnosis Date Noted   Lumbar disc prolapse with compression radiculopathy 07/11/2021   Triceps strain 07/11/2021   Status post total left knee replacement 07/09/2021   DJD (degenerative joint disease) of knee 07/09/2021   Primary osteoarthritis of left knee 05/03/2021   Colon cancer screening    Polyp of colon    Musculoskeletal chest pain 10/31/2020   Fall 10/31/2020   Muscle cramps 07/27/2020   Positive ANA (antinuclear antibody) 06/09/2020   Left knee pain 04/19/2020   Biceps tendon tear 04/19/2020    Right shoulder pain 04/19/2020   Arthralgia 04/19/2020   Overweight 05/28/2019   TMJ dysfunction 08/13/2017   Right wrist pain 02/03/2017   Hypersomnia 04/22/2016   Rash and nonspecific skin eruption 04/15/2016   Leg cramps 04/15/2016   History of herpes genitalis 12/14/2014   Skin lesion 04/14/2013   Chronic low back pain 04/14/2013   Leukocytosis 08/04/2012   Generalized anxiety disorder 08/04/2012   Hypogonadism male 10/24/2011   Hypertension 09/24/2011   GERD (gastroesophageal reflux disease) 09/24/2011   Erectile dysfunction 09/24/2011    REFERRING DIAG: M17.12 (ICD-10-CM) - Primary osteoarthritis of left knee  THERAPY DIAG:  Stiffness of left knee, not elsewhere classified  Muscle weakness (generalized)  Difficulty in walking, not elsewhere classified  Rationale for Evaluation and Treatment Rehabilitation  PERTINENT HISTORY: Gregory Kenyon Sr. is a 15 yoM who presents to [facility] 07/26/21 for rehabilization of Left TKA (07/08/21). Pt inititally had some pain management difficulty first postop week, but is improved now. He has been DC from [place] and has been working on his DC HEP.   PRECAUTIONS: TKE  SUBJECTIVE: Pt reports no pain at the moment. Pt reports his knee occ buckles. He sates, "I want more motion." Reports he thinks physioball hamstring curls have irritated his sciatica and asks if he can do another exercise  instead. Pt reports he is caffeinated this morning.  PAIN:  Are you having pain? Yes: NPRS scale: 2-3/10 Pain location: L knee  Pain description: intermittent Aggravating factors: walking, too much stretching or exercises, sleeping (sometimes)   Relieving factors: ice, meds      TODAY'S TREATMENT 09/11/21 :   There.ex:   Nustep AA/ROM x5 minutes, focus on 70SPM Education on walking program, getting a timer  15RM testing  LAQ RLE 1x15 @ 5lb, LLE 1x15 @ 5lb LAQ RLE 1x15 @ 9.5lb, LLE 3x15 @ 9.5lb [place] Hamstrings flexion 1x15 RLE 1t 4 plates, 1x10 LLE 4  plates, 2x15 LLE at 3 plates      PATIENT EDUCATION: Education details:Pt educated throughout session about proper posture and technique with exercises. Improved exercise technique, movement at target joints, use of target muscles after min to mod verbal, visual, tactile cues.  Person educated: Patient Education method: Explanation, Demonstration, Tactile cues, and Verbal cues Education comprehension: verbalized understanding, returned demonstration, verbal cues required, and needs further education   HOME EXERCISE PROGRAM: Pt to continue HEP as previously given [id number] *Please update next visit (#11)    PT Short Term Goals -       PT SHORT TERM GOAL #1   Title Pt to demosntrate Left knee flexion ROM 10-100 degrees.    Baseline Eval: 9-75 Left knee flexion; 09/04/21: 125 degrees flexion.   Time 3    Period Weeks    Status Achieved    Target Date 08/16/21      PT SHORT TERM GOAL #2   Title Pt to demonstrate performance of 825f AMB asisstive device ad lib without loss of symetry and without progression of antalgia, gait speed >1.060m.    Baseline Eval: 30032fPC for part, 0.12m23mminimal antalgia; 09/04/21: 6mwt84m800ft 16fime 4    Period Weeks    Status achieved   Target Date 08/23/21      PT SHORT TERM GOAL #3   Title 5xSTS <16sec hands-free from standard chair height    Baseline eval: needs elevation of seat and hands on knees for 25 sec; 09/04/21: 9.22sec arms crossed.    Time 4    Period Weeks    Status Achieved       Target Date 08/23/21              PT Long Term Goals -      PT LONG TERM GOAL #1   Title FOTO score improvement >14 points to indicate reduced difficulty in lifting groceries, light household actiivties, heavy household activities, typical housework, school work, or hobbies.    Baseline Eval: 40; 09/04/21: 62   Time 8    Period Weeks    Status Achieved      Target Date 09/20/21      PT LONG TERM GOAL #2   Title Pt to improve tolerance of  comfortable sitting to >2 hours to be able to go see a movie with family.    Baseline Eval: 3 minutes; 8/29: could make it 2 hours with weight shifting.    Time 8    Period Weeks    Status Achieved     Target Date 09/20/21      PT LONG TERM GOAL #3   Title Pt to demonstrate Lt knee flexion ROM <10->120 degrees to facilitate tolerance of basic sitting postures required for ADL, IADL.    Baseline eval: 9-75 degrees; 09/04/21: 125    Time 8    Period  Weeks    Status Achieved     Target Date 09/20/21      PT LONG TERM GOAL #4   Title Pt to demonstrate tolerance of 6MWT >1527f with good symetry, no LOB, AD ad lib, no antalgia.    Baseline eval: 3033f 8/29: 143856f Time 8    Period Weeks    Status On going     Target Date 09/20/21      PT LONG TERM GOAL #5   Title 5xSTS< 12sec hands free, standard surface height to improve tolerance of typical daily mobility needs.    Baseline 8/29 <10 sec hands free    Time 10    Period Weeks    Status Achieved     Target Date 10/04/21              Plan -    Clinical Impression Statement Continued education on differentiating between ADL walking/IADL walking and sustained waking for HEP with timed limited. Pt also expressed his biggest concerns of leg still feeling weak which autPryor Curiasures will be addressed. Pt tolerates 15RM quads and hamstrings testing today very well, but weight is limited due to presence of pain. The pt will benefit from further skilled PT to improve pain, strength, ROM and mobility.    Personal Factors and Comorbidities Behavior Pattern;Fitness;Time since onset of injury/illness/exacerbation    Examination-Activity Limitations Carry;Locomotion Level;Squat;Stand;Transfers    Examination-Participation Restrictions Driving;Interpersonal Relationship;Yard Work;Community Activity;Cleaning;Laundry    Stability/Clinical Decision Making Stable/Uncomplicated    Rehab Potential Excellent    PT Frequency 2x / week    PT  Duration 12 weeks    PT Treatment/Interventions ADLs/Self Care Home Management;Electrical Stimulation;Moist Heat;Cryotherapy;DME Instruction;Gait training;Stair training;Functional mobility training;Therapeutic activities;Therapeutic exercise;Balance training;Neuromuscular re-education;Patient/family education;Prosthetic Training;Compression bandaging;Scar mobilization;Passive range of motion;Dry needling    PT Next Visit Plan ...   PT Home Exercise Plan eval: reviewed quad set, SLR, SAQ (recommended elevated bolster to 45 degree start), added in hooklying bridge as new one. Did not have time to review all others    Consulted and Agree with Plan of Care Patient            10:38 AM, 09/11/21 AllEtta GrandchildT, DPT Physical Therapist - [facility]- [phone]  Physical Therapist - [phone]  09/11/2021, 10:38 AM

## 2021-09-13 ENCOUNTER — Encounter: Payer: Medicare Other | Admitting: Physical Therapy

## 2021-09-18 ENCOUNTER — Ambulatory Visit: Payer: Medicare Other

## 2021-09-18 DIAGNOSIS — M25662 Stiffness of left knee, not elsewhere classified: Secondary | ICD-10-CM | POA: Diagnosis not present

## 2021-09-18 DIAGNOSIS — R262 Difficulty in walking, not elsewhere classified: Secondary | ICD-10-CM

## 2021-09-18 DIAGNOSIS — M6281 Muscle weakness (generalized): Secondary | ICD-10-CM

## 2021-09-18 NOTE — Therapy (Signed)
OUTPATIENT PHYSICAL THERAPY TREATMENT   Patient Name: Gregory Stokes. MRN: [id number] DOB:01-13-1965, 56 y.o., male Today's Date: 09/18/2021  PCP:   Leone Haven, MD   REFERRING PROVIDER:  Leandrew Koyanagi, MD   PT End of Session - 09/18/21 1056     Visit Number 13    Number of Visits 17    Date for PT Re-Evaluation 10/18/21    Authorization Type Medicare (traditional)    Authorization Time Period 07/26/21-10/18/21    Progress Note Due on Visit 20    PT Start Time 1015    PT Stop Time 1059    PT Time Calculation (min) 44 min    Activity Tolerance Patient tolerated treatment well    Behavior During Therapy [facility] for tasks assessed/performed;Impulsive                Past Medical History:  Diagnosis Date   Anxiety    Bronchitis    GERD (gastroesophageal reflux disease)    Hypertension    Insomnia    Low testosterone    Osteoarthritis    Past Surgical History:  Procedure Laterality Date   COLONOSCOPY WITH PROPOFOL N/A 01/24/2021   Procedure: COLONOSCOPY WITH PROPOFOL;  Surgeon: Lin Landsman, MD;  Location: [facility] ENDOSCOPY;  Service: Gastroenterology;  Laterality: N/A;   NASAL SINUS SURGERY     Dr. Carlis Abbott   TOTAL KNEE ARTHROPLASTY Left 07/09/2021   Procedure: LEFT TOTAL KNEE ARTHROPLASTY;  Surgeon: Leandrew Koyanagi, MD;  Location: [place];  Service: Orthopedics;  Laterality: Left;   Unremarkable     WRIST SURGERY Right    Patient Active Problem List   Diagnosis Date Noted   Lumbar disc prolapse with compression radiculopathy 07/11/2021   Triceps strain 07/11/2021   Status post total left knee replacement 07/09/2021   DJD (degenerative joint disease) of knee 07/09/2021   Primary osteoarthritis of left knee 05/03/2021   Colon cancer screening    Polyp of colon    Musculoskeletal chest pain 10/31/2020   Fall 10/31/2020   Muscle cramps 07/27/2020   Positive ANA (antinuclear antibody) 06/09/2020   Left knee pain 04/19/2020   Biceps tendon tear 04/19/2020    Right shoulder pain 04/19/2020   Arthralgia 04/19/2020   Overweight 05/28/2019   TMJ dysfunction 08/13/2017   Right wrist pain 02/03/2017   Hypersomnia 04/22/2016   Rash and nonspecific skin eruption 04/15/2016   Leg cramps 04/15/2016   History of herpes genitalis 12/14/2014   Skin lesion 04/14/2013   Chronic low back pain 04/14/2013   Leukocytosis 08/04/2012   Generalized anxiety disorder 08/04/2012   Hypogonadism male 10/24/2011   Hypertension 09/24/2011   GERD (gastroesophageal reflux disease) 09/24/2011   Erectile dysfunction 09/24/2011    REFERRING DIAG: M17.12 (ICD-10-CM) - Primary osteoarthritis of left knee  THERAPY DIAG:  Stiffness of left knee, not elsewhere classified  Muscle weakness (generalized)  Difficulty in walking, not elsewhere classified  Rationale for Evaluation and Treatment Rehabilitation  PERTINENT HISTORY: Gregory Stokes. is a 56 yoM who presents to [facility] 07/26/21 for rehabilization of Left TKA (07/08/21). Pt inititally had some pain management difficulty first postop week, but is improved now. He has been DC from [place] and has been working on his DC HEP.   PRECAUTIONS: TKE  SUBJECTIVE: Patient present with questions about his progression of care, Range of motion, clicking in knee, and quick fatigue.   PAIN:  Are you having pain? Yes: NPRS scale: 2-3/10 Pain location: L knee  Pain  description: intermittent Aggravating factors: walking, too much stretching or exercises, sleeping (sometimes)   Relieving factors: ice, meds      TODAY'S TREATMENT 09/18/21 :   There.ex:   PT provide education on what to expect at this phase of rehab process with pain and mobility.   6" step: -step up/down 10x each LE -eccentric heel taps very challenging -step up/down skipping step 10x Hamstring lengthening 30 second each LE  GTB lateral squat steps 10 ft x 3 trials each direction  UE support: Single limb squat modified 10x each LE  Resisted ambulation  #17.5: forward/backwards 3x; lateral R and L 3x each direction (6 total lateral stepping); close CGA  Stool scooting forward 30 ft/backwards 30 feet focusing on slow controlled contraction x3 trials.   PATIENT EDUCATION: Education details:Pt educated throughout session about proper posture and technique with exercises. Improved exercise technique, movement at target joints, use of target muscles after min to mod verbal, visual, tactile cues.  Person educated: Patient Education method: Explanation, Demonstration, Tactile cues, and Verbal cues Education comprehension: verbalized understanding, returned demonstration, verbal cues required, and needs further education   HOME EXERCISE PROGRAM: Pt to continue HEP as previously given [id number] *Please update next visit (#11)    PT Short Term Goals -       PT SHORT TERM GOAL #1   Title Pt to demosntrate Left knee flexion ROM 10-100 degrees.    Baseline Eval: 9-75 Left knee flexion; 09/04/21: 125 degrees flexion.   Time 3    Period Weeks    Status Achieved    Target Date 08/16/21      PT SHORT TERM GOAL #2   Title Pt to demonstrate performance of 830f AMB asisstive device ad lib without loss of symetry and without progression of antalgia, gait speed >1.020m.    Baseline Eval: 30069fPC for part, 0.62m48mminimal antalgia; 09/04/21: 6mwt80m800ft 51fime 4    Period Weeks    Status achieved   Target Date 08/23/21      PT SHORT TERM GOAL #3   Title 5xSTS <16sec hands-free from standard chair height    Baseline eval: needs elevation of seat and hands on knees for 25 sec; 09/04/21: 9.22sec arms crossed.    Time 4    Period Weeks    Status Achieved       Target Date 08/23/21              PT Long Term Goals -      PT LONG TERM GOAL #1   Title FOTO score improvement >14 points to indicate reduced difficulty in lifting groceries, light household actiivties, heavy household activities, typical housework, school work, or hobbies.     Baseline Eval: 40; 09/04/21: 62   Time 8    Period Weeks    Status Achieved      Target Date 09/20/21      PT LONG TERM GOAL #2   Title Pt to improve tolerance of comfortable sitting to >2 hours to be able to go see a movie with family.    Baseline Eval: 3 minutes; 8/29: could make it 2 hours with weight shifting.    Time 8    Period Weeks    Status Achieved     Target Date 09/20/21      PT LONG TERM GOAL #3   Title Pt to demonstrate Lt knee flexion ROM <10->120 degrees to facilitate tolerance of basic sitting postures required for ADL, IADL.  Baseline eval: 9-75 degrees; 09/04/21: 125    Time 8    Period Weeks    Status Achieved     Target Date 09/20/21      PT LONG TERM GOAL #4   Title Pt to demonstrate tolerance of 6MWT >1511f with good symetry, no LOB, AD ad lib, no antalgia.    Baseline eval: 3054f 8/29: 143841f Time 8    Period Weeks    Status On going     Target Date 09/20/21      PT LONG TERM GOAL #5   Title 5xSTS< 12sec hands free, standard surface height to improve tolerance of typical daily mobility needs.    Baseline 8/29 <10 sec hands free    Time 10    Period Weeks    Status Achieved     Target Date 10/04/21              Plan -    Clinical Impression Statement Patient educated on need for continued focused strengthening due to limited capacity for functional mobility at this time. Patient educated on how muscle strengthening does take longer than he was originally expecting and that patient is performing well within parameters of surgery. Patient is highly motivated for progression of care.   The pt will benefit from further skilled PT to improve pain, strength, ROM and mobility.    Personal Factors and Comorbidities Behavior Pattern;Fitness;Time since onset of injury/illness/exacerbation    Examination-Activity Limitations Carry;Locomotion Level;Squat;Stand;Transfers    Examination-Participation Restrictions Driving;Interpersonal Relationship;Yard  Work;Community Activity;Cleaning;Laundry    Stability/Clinical Decision Making Stable/Uncomplicated    Rehab Potential Excellent    PT Frequency 2x / week    PT Duration 12 weeks    PT Treatment/Interventions ADLs/Self Care Home Management;Electrical Stimulation;Moist Heat;Cryotherapy;DME Instruction;Gait training;Stair training;Functional mobility training;Therapeutic activities;Therapeutic exercise;Balance training;Neuromuscular re-education;Patient/family education;Prosthetic Training;Compression bandaging;Scar mobilization;Passive range of motion;Dry needling    PT Next Visit Plan ...   PT Home Exercise Plan eval: reviewed quad set, SLR, SAQ (recommended elevated bolster to 45 degree start), added in hooklying bridge as new one. Did not have time to review all others    Consulted and Agree with Plan of Care Patient             MarJanna Arch   Physical Therapist - [phone]  09/18/2021, 1:41 PM

## 2021-09-19 ENCOUNTER — Other Ambulatory Visit: Payer: Self-pay | Admitting: Family Medicine

## 2021-09-19 ENCOUNTER — Telehealth: Payer: Self-pay | Admitting: *Deleted

## 2021-09-19 NOTE — Therapy (Signed)
OUTPATIENT PHYSICAL THERAPY TREATMENT   Patient Name: Gregory COCCIA Sr. MRN: [id number] DOB:Mar 25, 1965, 56 y.o., male Today's Date: 09/20/2021  PCP:   Leone Haven, MD   REFERRING PROVIDER:  Leandrew Koyanagi, MD   PT End of Session - 09/20/21 1015     Visit Number 14    Number of Visits 17    Date for PT Re-Evaluation 10/18/21    Authorization Type Medicare (traditional)    Authorization Time Period 07/26/21-10/18/21    Progress Note Due on Visit 20    PT Start Time 1015    PT Stop Time 1059    PT Time Calculation (min) 44 min    Activity Tolerance Patient tolerated treatment well    Behavior During Therapy [facility] for tasks assessed/performed;Impulsive                 Past Medical History:  Diagnosis Date   Anxiety    Bronchitis    GERD (gastroesophageal reflux disease)    Hypertension    Insomnia    Low testosterone    Osteoarthritis    Past Surgical History:  Procedure Laterality Date   COLONOSCOPY WITH PROPOFOL N/A 01/24/2021   Procedure: COLONOSCOPY WITH PROPOFOL;  Surgeon: Lin Landsman, MD;  Location: [facility] ENDOSCOPY;  Service: Gastroenterology;  Laterality: N/A;   NASAL SINUS SURGERY     Dr. Carlis Abbott   TOTAL KNEE ARTHROPLASTY Left 07/09/2021   Procedure: LEFT TOTAL KNEE ARTHROPLASTY;  Surgeon: Leandrew Koyanagi, MD;  Location: [place];  Service: Orthopedics;  Laterality: Left;   Unremarkable     WRIST SURGERY Right    Patient Active Problem List   Diagnosis Date Noted   Lumbar disc prolapse with compression radiculopathy 07/11/2021   Triceps strain 07/11/2021   Status post total left knee replacement 07/09/2021   DJD (degenerative joint disease) of knee 07/09/2021   Primary osteoarthritis of left knee 05/03/2021   Colon cancer screening    Polyp of colon    Musculoskeletal chest pain 10/31/2020   Fall 10/31/2020   Muscle cramps 07/27/2020   Positive ANA (antinuclear antibody) 06/09/2020   Left knee pain 04/19/2020   Biceps tendon tear 04/19/2020    Right shoulder pain 04/19/2020   Arthralgia 04/19/2020   Overweight 05/28/2019   TMJ dysfunction 08/13/2017   Right wrist pain 02/03/2017   Hypersomnia 04/22/2016   Rash and nonspecific skin eruption 04/15/2016   Leg cramps 04/15/2016   History of herpes genitalis 12/14/2014   Skin lesion 04/14/2013   Chronic low back pain 04/14/2013   Leukocytosis 08/04/2012   Generalized anxiety disorder 08/04/2012   Hypogonadism male 10/24/2011   Hypertension 09/24/2011   GERD (gastroesophageal reflux disease) 09/24/2011   Erectile dysfunction 09/24/2011    REFERRING DIAG: M17.12 (ICD-10-CM) - Primary osteoarthritis of left knee  THERAPY DIAG:  Stiffness of left knee, not elsewhere classified  Muscle weakness (generalized)  Difficulty in walking, not elsewhere classified  Rationale for Evaluation and Treatment Rehabilitation  PERTINENT HISTORY: Gregory Hagos Sr. is a 89 yoM who presents to [place] 07/26/21 for rehabilization of Left TKA (07/08/21). Pt inititally had some pain management difficulty first postop week, but is improved now. He has been DC from [place] and has been working on his DC HEP.   PRECAUTIONS: TKE  SUBJECTIVE: Patient reports he is going to a concert tomorrow night.   PAIN:  Are you having pain? Yes: NPRS scale: 2-3/10 Pain location: L knee  Pain description: intermittent Aggravating factors: walking, too much  stretching or exercises, sleeping (sometimes)   Relieving factors: ice, meds      TODAY'S TREATMENT 09/20/21 :   There.ex:  Nustep seat position 11 resistance 2-3; 4 minutes cues for sequencing and body mechanics  PT provide education on what to expect at this phase of rehab process with pain and mobility.   TRX squat 10x ;2 sets  Lateral squat TRX 10x each side 2 sets    Resisted ambulation #17.5: forward/backwards 3x; lateral R and L 3x each direction (6 total lateral stepping); close CGA  Supine:  Bridge 10x; 2 sets SLR 10x 2 sets   Ice cup  massage x 4 minutes  PATIENT EDUCATION: Education details:Pt educated throughout session about proper posture and technique with exercises. Improved exercise technique, movement at target joints, use of target muscles after min to mod verbal, visual, tactile cues.  Person educated: Patient Education method: Explanation, Demonstration, Tactile cues, and Verbal cues Education comprehension: verbalized understanding, returned demonstration, verbal cues required, and needs further education   HOME EXERCISE PROGRAM: Pt to continue HEP as previously given [id number] *Please update next visit (#11)    PT Short Term Goals -       PT SHORT TERM GOAL #1   Title Pt to demosntrate Left knee flexion ROM 10-100 degrees.    Baseline Eval: 9-75 Left knee flexion; 09/04/21: 125 degrees flexion.   Time 3    Period Weeks    Status Achieved    Target Date 08/16/21      PT SHORT TERM GOAL #2   Title Pt to demonstrate performance of 813f AMB asisstive device ad lib without loss of symetry and without progression of antalgia, gait speed >1.072m.    Baseline Eval: 30061fPC for part, 0.98m93mminimal antalgia; 09/04/21: 6mwt59m800ft 36fime 4    Period Weeks    Status achieved   Target Date 08/23/21      PT SHORT TERM GOAL #3   Title 5xSTS <16sec hands-free from standard chair height    Baseline eval: needs elevation of seat and hands on knees for 25 sec; 09/04/21: 9.22sec arms crossed.    Time 4    Period Weeks    Status Achieved       Target Date 08/23/21              PT Long Term Goals -      PT LONG TERM GOAL #1   Title FOTO score improvement >14 points to indicate reduced difficulty in lifting groceries, light household actiivties, heavy household activities, typical housework, school work, or hobbies.    Baseline Eval: 40; 09/04/21: 62   Time 8    Period Weeks    Status Achieved      Target Date 09/20/21      PT LONG TERM GOAL #2   Title Pt to improve tolerance of comfortable  sitting to >2 hours to be able to go see a movie with family.    Baseline Eval: 3 minutes; 8/29: could make it 2 hours with weight shifting.    Time 8    Period Weeks    Status Achieved     Target Date 09/20/21      PT LONG TERM GOAL #3   Title Pt to demonstrate Lt knee flexion ROM <10->120 degrees to facilitate tolerance of basic sitting postures required for ADL, IADL.    Baseline eval: 9-75 degrees; 09/04/21: 125    Time 8    Period Weeks  Status Achieved     Target Date 09/20/21      PT LONG TERM GOAL #4   Title Pt to demonstrate tolerance of 6MWT >1549f with good symetry, no LOB, AD ad lib, no antalgia.    Baseline eval: 3061f 8/29: 143858f Time 8    Period Weeks    Status On going     Target Date 09/20/21      PT LONG TERM GOAL #5   Title 5xSTS< 12sec hands free, standard surface height to improve tolerance of typical daily mobility needs.    Baseline 8/29 <10 sec hands free    Time 10    Period Weeks    Status Achieved     Target Date 10/04/21              Plan -    Clinical Impression Statement Patient tolerates use of TRX with frequent need of extrinsic cueing for body mechanics. He continues to require education on need for icing for swelling reduction. He is highly motivated for progression of care at this time.   The pt will benefit from further skilled PT to improve pain, strength, ROM and mobility.    Personal Factors and Comorbidities Behavior Pattern;Fitness;Time since onset of injury/illness/exacerbation    Examination-Activity Limitations Carry;Locomotion Level;Squat;Stand;Transfers    Examination-Participation Restrictions Driving;Interpersonal Relationship;Yard Work;Community Activity;Cleaning;Laundry    Stability/Clinical Decision Making Stable/Uncomplicated    Rehab Potential Excellent    PT Frequency 2x / week    PT Duration 12 weeks    PT Treatment/Interventions ADLs/Self Care Home Management;Electrical Stimulation;Moist Heat;Cryotherapy;DME  Instruction;Gait training;Stair training;Functional mobility training;Therapeutic activities;Therapeutic exercise;Balance training;Neuromuscular re-education;Patient/family education;Prosthetic Training;Compression bandaging;Scar mobilization;Passive range of motion;Dry needling    PT Next Visit Plan Progress strengthening   PT Home Exercise Plan eval: reviewed quad set, SLR, SAQ (recommended elevated bolster to 45 degree start), added in hooklying bridge as new one. Did not have time to review all others    Consulted and Agree with Plan of Care Patient             MarJanna Arch   Physical Therapist - [phone]  09/20/2021, 11:58 AM

## 2021-09-19 NOTE — Telephone Encounter (Signed)
(  Late entry for 08/21/21) Met with patient in office and he states he is improving. Provided Patient satisfaction survey for patient to take home as he states he would like to look over it before completing and will send back via email.

## 2021-09-20 ENCOUNTER — Ambulatory Visit: Payer: Medicare Other

## 2021-09-20 DIAGNOSIS — M6281 Muscle weakness (generalized): Secondary | ICD-10-CM

## 2021-09-20 DIAGNOSIS — M25662 Stiffness of left knee, not elsewhere classified: Secondary | ICD-10-CM

## 2021-09-20 DIAGNOSIS — R262 Difficulty in walking, not elsewhere classified: Secondary | ICD-10-CM

## 2021-09-24 ENCOUNTER — Other Ambulatory Visit: Payer: Self-pay | Admitting: Family Medicine

## 2021-09-24 DIAGNOSIS — F411 Generalized anxiety disorder: Secondary | ICD-10-CM

## 2021-09-25 ENCOUNTER — Ambulatory Visit: Payer: Medicare Other

## 2021-09-25 ENCOUNTER — Other Ambulatory Visit: Payer: Self-pay | Admitting: Family Medicine

## 2021-09-25 ENCOUNTER — Encounter: Payer: Medicare Other | Admitting: Physical Therapy

## 2021-09-25 DIAGNOSIS — M25662 Stiffness of left knee, not elsewhere classified: Secondary | ICD-10-CM

## 2021-09-25 DIAGNOSIS — R262 Difficulty in walking, not elsewhere classified: Secondary | ICD-10-CM

## 2021-09-25 DIAGNOSIS — M6281 Muscle weakness (generalized): Secondary | ICD-10-CM

## 2021-09-25 DIAGNOSIS — F411 Generalized anxiety disorder: Secondary | ICD-10-CM

## 2021-09-25 NOTE — Therapy (Signed)
OUTPATIENT PHYSICAL THERAPY TREATMENT   Patient Name: Gregory LANIGAN Sr. MRN: [id number] DOB:29-Mar-1965, 56 y.o., male Today's Date: 09/25/2021  PCP:   Gregory Haven, MD   REFERRING PROVIDER:  Leandrew Koyanagi, MD   PT End of Session - 09/25/21 1017     Visit Number 15    Number of Visits 17    Date for PT Re-Evaluation 10/18/21    Authorization Type Medicare (traditional)    Authorization Time Period 07/26/21-10/18/21    Progress Note Due on Visit 20    PT Start Time 1016    PT Stop Time 1059    PT Time Calculation (min) 43 min    Activity Tolerance Patient tolerated treatment well    Behavior During Therapy [facility] for tasks assessed/performed;Impulsive                  Past Medical History:  Diagnosis Date   Anxiety    Bronchitis    GERD (gastroesophageal reflux disease)    Hypertension    Insomnia    Low testosterone    Osteoarthritis    Past Surgical History:  Procedure Laterality Date   COLONOSCOPY WITH PROPOFOL N/A 01/24/2021   Procedure: COLONOSCOPY WITH PROPOFOL;  Surgeon: Gregory Landsman, MD;  Location: [facility] ENDOSCOPY;  Service: Gastroenterology;  Laterality: N/A;   NASAL SINUS SURGERY     Dr. Carlis Stokes   TOTAL KNEE ARTHROPLASTY Left 07/09/2021   Procedure: LEFT TOTAL KNEE ARTHROPLASTY;  Surgeon: Gregory Koyanagi, MD;  Location: [place];  Service: Orthopedics;  Laterality: Left;   Unremarkable     WRIST SURGERY Right    Patient Active Problem List   Diagnosis Date Noted   Lumbar disc prolapse with compression radiculopathy 07/11/2021   Triceps strain 07/11/2021   Status post total left knee replacement 07/09/2021   DJD (degenerative joint disease) of knee 07/09/2021   Primary osteoarthritis of left knee 05/03/2021   Colon cancer screening    Polyp of colon    Musculoskeletal chest pain 10/31/2020   Fall 10/31/2020   Muscle cramps 07/27/2020   Positive ANA (antinuclear antibody) 06/09/2020   Left knee pain 04/19/2020   Biceps tendon tear  04/19/2020   Right shoulder pain 04/19/2020   Arthralgia 04/19/2020   Overweight 05/28/2019   TMJ dysfunction 08/13/2017   Right wrist pain 02/03/2017   Hypersomnia 04/22/2016   Rash and nonspecific skin eruption 04/15/2016   Leg cramps 04/15/2016   History of herpes genitalis 12/14/2014   Skin lesion 04/14/2013   Chronic low back pain 04/14/2013   Leukocytosis 08/04/2012   Generalized anxiety disorder 08/04/2012   Hypogonadism male 10/24/2011   Hypertension 09/24/2011   GERD (gastroesophageal reflux disease) 09/24/2011   Erectile dysfunction 09/24/2011    REFERRING DIAG: M17.12 (ICD-10-CM) - Primary osteoarthritis of left knee  THERAPY DIAG:  Stiffness of left knee, not elsewhere classified  Muscle weakness (generalized)  Difficulty in walking, not elsewhere classified  Rationale for Evaluation and Treatment Rehabilitation  PERTINENT HISTORY: Gregory Azimi Sr. is a 77 yoM who presents to [place] 07/26/21 for rehabilization of Left TKA (07/08/21). Pt inititally had some pain management difficulty first postop week, but is improved now. He has been DC from [place] and has been working on his DC HEP.   PRECAUTIONS: TKE  SUBJECTIVE: Patient reports he wants to decrease to 1x/week due to pain and swelling in knee. Encouraged to follow up with physician.   PAIN:  Are you having pain? Yes: NPRS scale: 2-3/10 Pain  location: L knee  Pain description: intermittent Aggravating factors: walking, too much stretching or exercises, sleeping (sometimes)   Relieving factors: ice, meds      TODAY'S TREATMENT 09/25/21 :   There.ex:    PT provide education on what to expect at this phase of rehab process with pain and mobility.   PreCore Leg press #25; LLE 10x; 2 sets    Supine: Bridge 10x; 3 sets SLR 10x 3 sets  Quad contraction with ice 10x 5 second holds  Long sit Hip flexor arc over hedgehog 10x   Prone hamstring curl 10x LLE  x2 sets Prone hip extension 10x LLE x2 sets    Sidleying  -LLE hip extension 10x -LLE clamshell 15x -LLE reverse clamshell 15x   Seated LAQ with adduction ball squeeze between feet 15x   PATIENT EDUCATION: Education details:Pt educated throughout session about proper posture and technique with exercises. Improved exercise technique, movement at target joints, use of target muscles after min to mod verbal, visual, tactile cues.  Person educated: Patient Education method: Explanation, Demonstration, Tactile cues, and Verbal cues Education comprehension: verbalized understanding, returned demonstration, verbal cues required, and needs further education   HOME EXERCISE PROGRAM: Pt to continue HEP as previously given [id number] *Please update next visit (#11)    PT Short Term Goals -       PT SHORT TERM GOAL #1   Title Pt to demosntrate Left knee flexion ROM 10-100 degrees.    Baseline Eval: 9-75 Left knee flexion; 09/04/21: 125 degrees flexion.   Time 3    Period Weeks    Status Achieved    Target Date 08/16/21      PT SHORT TERM GOAL #2   Title Pt to demonstrate performance of 855f AMB asisstive device ad lib without loss of symetry and without progression of antalgia, gait speed >1.057m.    Baseline Eval: 30027fPC for part, 0.31m14mminimal antalgia; 09/04/21: 6mwt32m800ft 33fime 4    Period Weeks    Status achieved   Target Date 08/23/21      PT SHORT TERM GOAL #3   Title 5xSTS <16sec hands-free from standard chair height    Baseline eval: needs elevation of seat and hands on knees for 25 sec; 09/04/21: 9.22sec arms crossed.    Time 4    Period Weeks    Status Achieved       Target Date 08/23/21              PT Long Term Goals -      PT LONG TERM GOAL #1   Title FOTO score improvement >14 points to indicate reduced difficulty in lifting groceries, light household actiivties, heavy household activities, typical housework, school work, or hobbies.    Baseline Eval: 40; 09/04/21: 62   Time 8    Period Weeks     Status Achieved      Target Date 09/20/21      PT LONG TERM GOAL #2   Title Pt to improve tolerance of comfortable sitting to >2 hours to be able to go see a movie with family.    Baseline Eval: 3 minutes; 8/29: could make it 2 hours with weight shifting.    Time 8    Period Weeks    Status Achieved     Target Date 09/20/21      PT LONG TERM GOAL #3   Title Pt to demonstrate Lt knee flexion ROM <10->120 degrees to facilitate tolerance of  basic sitting postures required for ADL, IADL.    Baseline eval: 9-75 degrees; 09/04/21: 125    Time 8    Period Weeks    Status Achieved     Target Date 09/20/21      PT LONG TERM GOAL #4   Title Pt to demonstrate tolerance of 6MWT >1560f with good symetry, no LOB, AD ad lib, no antalgia.    Baseline eval: 3068f 8/29: 143823f Time 8    Period Weeks    Status On going     Target Date 09/20/21      PT LONG TERM GOAL #5   Title 5xSTS< 12sec hands free, standard surface height to improve tolerance of typical daily mobility needs.    Baseline 8/29 <10 sec hands free    Time 10    Period Weeks    Status Achieved     Target Date 10/04/21              Plan -    Clinical Impression Statement Patient reports he wants to decrease to 1x/week due to pain and swelling in knee. Encouraged to follow up with physician.  Patient agreeable to follow up with physician. Continues to have pain with close chained interventions. Patient continues to require education on icing need between and after sessions. The pt will benefit from further skilled PT to improve pain, strength, ROM and mobility.    Personal Factors and Comorbidities Behavior Pattern;Fitness;Time since onset of injury/illness/exacerbation    Examination-Activity Limitations Carry;Locomotion Level;Squat;Stand;Transfers    Examination-Participation Restrictions Driving;Interpersonal Relationship;Yard Work;Community Activity;Cleaning;Laundry    Stability/Clinical Decision Making  Stable/Uncomplicated    Rehab Potential Excellent    PT Frequency 2x / week    PT Duration 12 weeks    PT Treatment/Interventions ADLs/Self Care Home Management;Electrical Stimulation;Moist Heat;Cryotherapy;DME Instruction;Gait training;Stair training;Functional mobility training;Therapeutic activities;Therapeutic exercise;Balance training;Neuromuscular re-education;Patient/family education;Prosthetic Training;Compression bandaging;Scar mobilization;Passive range of motion;Dry needling    PT Next Visit Plan Progress strengthening   PT Home Exercise Plan eval: reviewed quad set, SLR, SAQ (recommended elevated bolster to 45 degree start), added in hooklying bridge as new one. Did not have time to review all others    Consulted and Agree with Plan of Care Patient             MarJanna Arch   Physical Therapist - [phone]  09/25/2021, 11:21 AM

## 2021-09-26 MED ORDER — ALPRAZOLAM 0.5 MG PO TABS: ORAL_TABLET | ORAL | 0 refills | Status: DC

## 2021-09-27 ENCOUNTER — Encounter: Payer: Medicare Other | Admitting: Physical Therapy

## 2021-09-27 ENCOUNTER — Ambulatory Visit: Payer: Medicare Other

## 2021-09-27 ENCOUNTER — Other Ambulatory Visit: Payer: Self-pay | Admitting: Family Medicine

## 2021-10-02 ENCOUNTER — Ambulatory Visit: Payer: Medicare Other

## 2021-10-02 ENCOUNTER — Encounter: Payer: Medicare Other | Admitting: Physical Therapy

## 2021-10-02 ENCOUNTER — Encounter: Payer: Medicare Other | Admitting: Orthopaedic Surgery

## 2021-10-02 DIAGNOSIS — R262 Difficulty in walking, not elsewhere classified: Secondary | ICD-10-CM

## 2021-10-02 DIAGNOSIS — M25662 Stiffness of left knee, not elsewhere classified: Secondary | ICD-10-CM

## 2021-10-02 DIAGNOSIS — M6281 Muscle weakness (generalized): Secondary | ICD-10-CM

## 2021-10-02 NOTE — Therapy (Signed)
OUTPATIENT PHYSICAL THERAPY TREATMENT   Patient Name: Gregory Stokes Sr. MRN: [id number] DOB:07-Aug-1965, 56 y.o., male Today's Date: 10/02/2021  PCP:   Leone Haven, MD   REFERRING PROVIDER:  Leandrew Koyanagi, MD   PT End of Session - 10/02/21 1246     Visit Number 16    Number of Visits 17    Date for PT Re-Evaluation 10/18/21    Authorization Type Medicare (traditional)    Authorization Time Period 07/26/21-10/18/21    Progress Note Due on Visit 20    PT Start Time 1250    PT Stop Time 1336    PT Time Calculation (min) 46 min    Activity Tolerance Patient tolerated treatment well    Behavior During Therapy [facility] for tasks assessed/performed;Impulsive                  Past Medical History:  Diagnosis Date   Anxiety    Bronchitis    GERD (gastroesophageal reflux disease)    Hypertension    Insomnia    Low testosterone    Osteoarthritis    Past Surgical History:  Procedure Laterality Date   COLONOSCOPY WITH PROPOFOL N/A 01/24/2021   Procedure: COLONOSCOPY WITH PROPOFOL;  Surgeon: Lin Landsman, MD;  Location: [facility] ENDOSCOPY;  Service: Gastroenterology;  Laterality: N/A;   NASAL SINUS SURGERY     Dr. Carlis Abbott   TOTAL KNEE ARTHROPLASTY Left 07/09/2021   Procedure: LEFT TOTAL KNEE ARTHROPLASTY;  Surgeon: Leandrew Koyanagi, MD;  Location: [place];  Service: Orthopedics;  Laterality: Left;   Unremarkable     WRIST SURGERY Right    Patient Active Problem List   Diagnosis Date Noted   Lumbar disc prolapse with compression radiculopathy 07/11/2021   Triceps strain 07/11/2021   Status post total left knee replacement 07/09/2021   DJD (degenerative joint disease) of knee 07/09/2021   Primary osteoarthritis of left knee 05/03/2021   Colon cancer screening    Polyp of colon    Musculoskeletal chest pain 10/31/2020   Fall 10/31/2020   Muscle cramps 07/27/2020   Positive ANA (antinuclear antibody) 06/09/2020   Left knee pain 04/19/2020   Biceps tendon tear  04/19/2020   Right shoulder pain 04/19/2020   Arthralgia 04/19/2020   Overweight 05/28/2019   TMJ dysfunction 08/13/2017   Right wrist pain 02/03/2017   Hypersomnia 04/22/2016   Rash and nonspecific skin eruption 04/15/2016   Leg cramps 04/15/2016   History of herpes genitalis 12/14/2014   Skin lesion 04/14/2013   Chronic low back pain 04/14/2013   Leukocytosis 08/04/2012   Generalized anxiety disorder 08/04/2012   Hypogonadism male 10/24/2011   Hypertension 09/24/2011   GERD (gastroesophageal reflux disease) 09/24/2011   Erectile dysfunction 09/24/2011    REFERRING DIAG: M17.12 (ICD-10-CM) - Primary osteoarthritis of left knee  THERAPY DIAG:  Stiffness of left knee, not elsewhere classified  Muscle weakness (generalized)  Difficulty in walking, not elsewhere classified  Rationale for Evaluation and Treatment Rehabilitation  PERTINENT HISTORY: Gregory Fenlon Sr. is a 39 yoM who presents to [place] 07/26/21 for rehabilization of Left TKA (07/08/21). Pt inititally had some pain management difficulty first postop week, but is improved now. He has been DC from [place] and has been working on his DC HEP.   PRECAUTIONS: TKE  SUBJECTIVE: Patient reports he wants to decrease to 1x/week due to pain and swelling in knee. Encouraged to follow up with physician.   PAIN:  Are you having pain? Yes: NPRS scale: 2-3/10 Pain  location: L knee  Pain description: intermittent Aggravating factors: walking, too much stretching or exercises, sleeping (sometimes)   Relieving factors: ice, meds      TODAY'S TREATMENT 10/02/21 :   There.ex:    PT provide education on what to expect at this phase of rehab process with pain and mobility.   PreCore Leg press #25; LLE 10x; 2 sets  PreCore Calf press 25# LLE 2 sets of 10 reps   Supine:  SLR 10x 2 sets    Long sit Hip flexor arc over hedgehog  2 sets of 10x   Prone hamstring curl 10x LLE  x2 sets Prone hip extension 10x LLE x2 sets    Sidleying   -LLE clamshell 10x -Hip abd  LLE(SLR) - lying on right side -Hip add  LLE (SLR) -lying on left side   Standing:   Step ups x 12  Side step up x 12  Eccentric step down Tandem stand in // bars x 20 sec x 2 LLE Tandem walk (forward/backward)  in // bars down and back x 3 rounds Single leg stance x 10 sec hold x 3  Single leg stance on airex pad x 10 sec x 2 LLE  Measured Left knee AROM= 127 deg   ICE to left LE in supine with LE elevated with wedge x 5 min (unbilled)    PATIENT EDUCATION: Education details:Pt educated throughout session about proper posture and technique with exercises. Improved exercise technique, movement at target joints, use of target muscles after min to mod verbal, visual, tactile cues.  Person educated: Patient Education method: Explanation, Demonstration, Tactile cues, and Verbal cues Education comprehension: verbalized understanding, returned demonstration, verbal cues required, and needs further education   HOME EXERCISE PROGRAM: Pt to continue HEP as previously given [id number] *Please update next visit (#11)    PT Short Term Goals -       PT SHORT TERM GOAL #1   Title Pt to demosntrate Left knee flexion ROM 10-100 degrees.    Baseline Eval: 9-75 Left knee flexion; 09/04/21: 125 degrees flexion.   Time 3    Period Weeks    Status Achieved    Target Date 08/16/21      PT SHORT TERM GOAL #2   Title Pt to demonstrate performance of 867f AMB asisstive device ad lib without loss of symetry and without progression of antalgia, gait speed >1.071m.    Baseline Eval: 30052fPC for part, 0.32m71mminimal antalgia; 09/04/21: 6mwt77m800ft 51fime 4    Period Weeks    Status achieved   Target Date 08/23/21      PT SHORT TERM GOAL #3   Title 5xSTS <16sec hands-free from standard chair height    Baseline eval: needs elevation of seat and hands on knees for 25 sec; 09/04/21: 9.22sec arms crossed.    Time 4    Period Weeks    Status  Achieved       Target Date 08/23/21              PT Long Term Goals -      PT LONG TERM GOAL #1   Title FOTO score improvement >14 points to indicate reduced difficulty in lifting groceries, light household actiivties, heavy household activities, typical housework, school work, or hobbies.    Baseline Eval: 40; 09/04/21: 62   Time 8    Period Weeks    Status Achieved      Target Date 09/20/21  PT LONG TERM GOAL #2   Title Pt to improve tolerance of comfortable sitting to >2 hours to be able to go see a movie with family.    Baseline Eval: 3 minutes; 8/29: could make it 2 hours with weight shifting.    Time 8    Period Weeks    Status Achieved     Target Date 09/20/21      PT LONG TERM GOAL #3   Title Pt to demonstrate Lt knee flexion ROM <10->120 degrees to facilitate tolerance of basic sitting postures required for ADL, IADL.    Baseline eval: 9-75 degrees; 09/04/21: 125    Time 8    Period Weeks    Status Achieved     Target Date 09/20/21      PT LONG TERM GOAL #4   Title Pt to demonstrate tolerance of 6MWT >1529f with good symetry, no LOB, AD ad lib, no antalgia.    Baseline eval: 3042f 8/29: 143850f Time 8    Period Weeks    Status On going     Target Date 09/20/21      PT LONG TERM GOAL #5   Title 5xSTS< 12sec hands free, standard surface height to improve tolerance of typical daily mobility needs.    Baseline 8/29 <10 sec hands free    Time 10    Period Weeks    Status Achieved     Target Date 10/04/21              Plan -    Clinical Impression Statement Continued with Plan of care focusing on LE ROM/strengthening- Patient presents with improving ROM and able to complete OKC and CKC activities without report of increased pain. He was able to perform SLS well as well as tandem without LOB today. The pt will benefit from further skilled PT to improve pain, strength, ROM and mobility.    Personal Factors and Comorbidities Behavior  Pattern;Fitness;Time since onset of injury/illness/exacerbation    Examination-Activity Limitations Carry;Locomotion Level;Squat;Stand;Transfers    Examination-Participation Restrictions Driving;Interpersonal Relationship;Yard Work;Community Activity;Cleaning;Laundry    Stability/Clinical Decision Making Stable/Uncomplicated    Rehab Potential Excellent    PT Frequency 2x / week    PT Duration 12 weeks    PT Treatment/Interventions ADLs/Self Care Home Management;Electrical Stimulation;Moist Heat;Cryotherapy;DME Instruction;Gait training;Stair training;Functional mobility training;Therapeutic activities;Therapeutic exercise;Balance training;Neuromuscular re-education;Patient/family education;Prosthetic Training;Compression bandaging;Scar mobilization;Passive range of motion;Dry needling    PT Next Visit Plan Progress strengthening   PT Home Exercise Plan eval: reviewed quad set, SLR, SAQ (recommended elevated bolster to 45 degree start), added in hooklying bridge as new one. Did not have time to review all others    Consulted and Agree with Plan of Care Patient             JefLewis Moccasin   Physical Therapist - [phone]  10/02/2021, 1:44 PM

## 2021-10-04 ENCOUNTER — Ambulatory Visit: Payer: Medicare Other

## 2021-10-04 ENCOUNTER — Ambulatory Visit (INDEPENDENT_AMBULATORY_CARE_PROVIDER_SITE_OTHER): Payer: Medicare Other | Admitting: Orthopaedic Surgery

## 2021-10-04 ENCOUNTER — Encounter: Payer: Self-pay | Admitting: Orthopaedic Surgery

## 2021-10-04 ENCOUNTER — Encounter: Payer: Medicare Other | Admitting: Physical Therapy

## 2021-10-04 DIAGNOSIS — Z96652 Presence of left artificial knee joint: Secondary | ICD-10-CM

## 2021-10-04 NOTE — Progress Notes (Signed)
Post-Op Visit Note   Patient: Gregory COOKSEY Sr.           Date of Birth: [birth year]           MRN: [id number] Visit Date: 10/04/2021 PCP: Leone Haven, MD   Assessment & Plan:  Chief Complaint:  Chief Complaint  Patient presents with   Left Knee - Follow-up    Left total knee arthroplasty 07/09/2021   Visit Diagnoses:  1. Status post left knee replacement     Plan: Gregory Stokes is 3 months status post left total knee replacement.  He is doing well overall.  He has felt a significant improvement in symptoms just in the last week.  Examination of the left knee shows a fully healed surgical scar.  Expected postoperative swelling.  No signs of infection.  Range of motion is excellent.  No varus valgus instability.  Gregory Stokes is doing well today.  Sounds like he has finally turned the corner.  Dental prophylaxis reinforced.  Recheck in 3 months with two-view x-rays of the left knee.  Follow-Up Instructions: Return in about 3 months (around 01/03/2022).   Orders:  No orders of the defined types were placed in this encounter.  No orders of the defined types were placed in this encounter.   Imaging: No results found.  PMFS History: Patient Active Problem List   Diagnosis Date Noted   Status post left knee replacement 10/04/2021   Lumbar disc prolapse with compression radiculopathy 07/11/2021   Triceps strain 07/11/2021   Status post total left knee replacement 07/09/2021   DJD (degenerative joint disease) of knee 07/09/2021   Primary osteoarthritis of left knee 05/03/2021   Colon cancer screening    Polyp of colon    Musculoskeletal chest pain 10/31/2020   Fall 10/31/2020   Muscle cramps 07/27/2020   Positive ANA (antinuclear antibody) 06/09/2020   Left knee pain 04/19/2020   Biceps tendon tear 04/19/2020   Right shoulder pain 04/19/2020   Arthralgia 04/19/2020   Overweight 05/28/2019   TMJ dysfunction 08/13/2017   Right wrist pain 02/03/2017   Hypersomnia 04/22/2016    Rash and nonspecific skin eruption 04/15/2016   Leg cramps 04/15/2016   History of herpes genitalis 12/14/2014   Skin lesion 04/14/2013   Chronic low back pain 04/14/2013   Leukocytosis 08/04/2012   Generalized anxiety disorder 08/04/2012   Hypogonadism male 10/24/2011   Hypertension 09/24/2011   GERD (gastroesophageal reflux disease) 09/24/2011   Erectile dysfunction 09/24/2011   Past Medical History:  Diagnosis Date   Anxiety    Bronchitis    GERD (gastroesophageal reflux disease)    Hypertension    Insomnia    Low testosterone    Osteoarthritis     Family History  Problem Relation Age of Onset   Lung cancer Father        deceased   Parkinson's disease Mother     Past Surgical History:  Procedure Laterality Date   COLONOSCOPY WITH PROPOFOL N/A 01/24/2021   Procedure: COLONOSCOPY WITH PROPOFOL;  Surgeon: Lin Landsman, MD;  Location: [facility] ENDOSCOPY;  Service: Gastroenterology;  Laterality: N/A;   NASAL SINUS SURGERY     Dr. Carlis Abbott   TOTAL KNEE ARTHROPLASTY Left 07/09/2021   Procedure: LEFT TOTAL KNEE ARTHROPLASTY;  Surgeon: Leandrew Koyanagi, MD;  Location: [place];  Service: Orthopedics;  Laterality: Left;   Unremarkable     WRIST SURGERY Right    Social History   Occupational History   Not on file  Tobacco Use   Smoking status: Former    Types: Cigarettes    Quit date: 2010    Years since quitting: 13.7   Smokeless tobacco: Never   Tobacco comments:    smoked age [age] stopped then age 70 x 1 years max 1/2 ppd   Vaping Use   Vaping Use: Never used  Substance and Sexual Activity   Alcohol use: No    Alcohol/week: 0.0 standard drinks of alcohol   Drug use: No   Sexual activity: Yes

## 2021-10-05 ENCOUNTER — Telehealth: Payer: Self-pay | Admitting: *Deleted

## 2021-10-05 NOTE — Telephone Encounter (Signed)
Patient seen in office on 10/04/21 by Dr. Erlinda Hong and provided again the patient satisfaction survey along with Sydnee Levans. Verbalized frustration over pain management after surgery as he has been a chronic pain medication patient prior to surgery and felt his pain relief was not addressed after surgery.

## 2021-10-08 ENCOUNTER — Ambulatory Visit: Payer: Medicare Other | Admitting: Physical Medicine & Rehabilitation

## 2021-10-09 ENCOUNTER — Ambulatory Visit: Payer: Medicare Other

## 2021-10-09 ENCOUNTER — Encounter: Payer: Medicare Other | Admitting: Physical Therapy

## 2021-10-11 ENCOUNTER — Encounter: Payer: Medicare Other | Admitting: Physical Therapy

## 2021-10-16 ENCOUNTER — Ambulatory Visit: Payer: Medicare Other | Admitting: Physical Therapy

## 2021-10-16 ENCOUNTER — Encounter: Payer: Medicare Other | Admitting: Physical Therapy

## 2021-10-17 ENCOUNTER — Ambulatory Visit (INDEPENDENT_AMBULATORY_CARE_PROVIDER_SITE_OTHER): Payer: Medicare Other | Admitting: Family Medicine

## 2021-10-17 ENCOUNTER — Other Ambulatory Visit (HOSPITAL_COMMUNITY)
Admission: RE | Admit: 2021-10-17 | Discharge: 2021-10-17 | Disposition: A | Discharge status: Home / Self Care | Payer: Medicare Other | Source: Ambulatory Visit | Attending: Family Medicine | Admitting: Family Medicine

## 2021-10-17 ENCOUNTER — Encounter: Payer: Self-pay | Admitting: Family Medicine

## 2021-10-17 VITALS — BP 110/79 | HR 82 | Temp 98.9°F | Ht 71.0 in | Wt 173.8 lb

## 2021-10-17 DIAGNOSIS — Z96652 Presence of left artificial knee joint: Secondary | ICD-10-CM

## 2021-10-17 DIAGNOSIS — I10 Essential (primary) hypertension: Secondary | ICD-10-CM | POA: Diagnosis not present

## 2021-10-17 DIAGNOSIS — Z114 Encounter for screening for human immunodeficiency virus [HIV]: Secondary | ICD-10-CM

## 2021-10-17 DIAGNOSIS — Z113 Encounter for screening for infections with a predominantly sexual mode of transmission: Secondary | ICD-10-CM | POA: Insufficient documentation

## 2021-10-17 DIAGNOSIS — F411 Generalized anxiety disorder: Secondary | ICD-10-CM | POA: Diagnosis not present

## 2021-10-17 MED ORDER — LISINOPRIL 5 MG PO TABS: 5.0000 mg | ORAL_TABLET | Freq: Every day | ORAL | Status: AC

## 2021-10-17 NOTE — Assessment & Plan Note (Signed)
Check labs as outlined below.

## 2021-10-17 NOTE — Patient Instructions (Signed)
Nice to see you. We will contact you with your lab results. 

## 2021-10-17 NOTE — Assessment & Plan Note (Signed)
Well-controlled.  He can continue Xanax 0.5 mg 3 times daily as needed for anxiety.

## 2021-10-17 NOTE — Progress Notes (Signed)
[name], MD Phone: [phone]  Gregory Drown Sr. is a 56 y.o. male who presents today for follow-up.  Anxiety: Patient notes this is stable.  He takes Xanax 1-3 times a day.  It does not make him drowsy.  He been stable on this dose for quite some time.  HYPERTENSION Disease Monitoring Home BP Monitoring 120s/79 Chest pain- no    Dyspnea- no Medications Compliance-  taking lisinopril 5 mg daily.  BMET    Component Value Date/Time   NA 142 07/02/2021 0855   K 4.5 07/02/2021 0855   CL 110 07/02/2021 0855   CO2 27 07/02/2021 0855   GLUCOSE 89 07/02/2021 0855   BUN 12 07/02/2021 0855   CREATININE 1.10 07/02/2021 0855   CALCIUM 9.4 07/02/2021 0855   GFRNONAA >60 07/02/2021 0855   GFRAA >60 09/07/2017 1229   Status post left knee replacement: Patient notes he still has some swelling and pain.  He notes it has improved since right after the surgery.  He still doing physical therapy.  He notes his surgeon feels as though he is progressing adequately.  Patient requests STD screening today.  He notes no recent concerns of contracting an STD though does note he had some kind of STD a number of years ago that was treated to the health department.  He just wants screening today to make sure there is nothing going on with this.  He notes no discharge from his penis.  He notes no odd rashes.  Social History   Tobacco Use  Smoking Status Former   Types: Cigarettes   Quit date: 2010   Years since quitting: 13.7  Smokeless Tobacco Never  Tobacco Comments   smoked age [age] stopped then age 58 x 1 years max 1/2 ppd     Current Outpatient Medications on File Prior to Visit  Medication Sig Dispense Refill   albuterol (PROVENTIL HFA;VENTOLIN HFA) 108 (90 Base) MCG/ACT inhaler Inhale 1-2 puffs into the lungs every 6 (six) hours as needed for wheezing or shortness of breath. 1 Inhaler 1   ALPRAZolam (XANAX) 0.5 MG tablet TAKE 1 TABLET(0.5 MG) BY MOUTH THREE TIMES DAILY AS NEEDED FOR  ANXIETY 90 tablet 0   amoxicillin (AMOXIL) 500 MG capsule Take 4 pills one hour prior to dental work 8 capsule 2   aspirin EC 81 MG tablet Take 1 tablet (81 mg total) by mouth in the morning and at bedtime. To be taken after surgery to prevent blood clots.  Swallow whole. 84 tablet 2   Avanafil (STENDRA) 200 MG TABS TAKE 1/2 TABLET BY MOUTH AS DIRECTED     celecoxib (CELEBREX) 200 MG capsule Take 1 capsule (200 mg total) by mouth 2 (two) times daily as needed. 28 capsule 0   docusate sodium (COLACE) 100 MG capsule Take 1 capsule (100 mg total) by mouth daily as needed. 30 capsule 2   gabapentin (NEURONTIN) 400 MG capsule TAKE 2 CAPSULES(800 MG) BY MOUTH THREE TIMES DAILY 540 capsule 1   HYDROcodone-acetaminophen (NORCO) 5-325 MG tablet Take 1 tablet by mouth daily as needed. 30 tablet 0   ketorolac (TORADOL) 10 MG tablet Take 1 tablet (10 mg total) by mouth 2 (two) times daily as needed. 10 tablet 0   Multiple Vitamin (MULTIVITAMIN) tablet Take 1 tablet by mouth daily.     omeprazole (PRILOSEC) 40 MG capsule TAKE ONE CAPSULE BY MOUTH DAILY 150 capsule 1   ondansetron (ZOFRAN) 4 MG tablet Take 1 tablet (4 mg total) by  mouth every 8 (eight) hours as needed for nausea or vomiting. 40 tablet 0   oxyCODONE-acetaminophen (PERCOCET) 10-325 MG tablet Take 1 tablet by mouth 2 (two) times daily as needed for pain. 20 tablet 0   testosterone cypionate (DEPOTESTOSTERONE CYPIONATE) 200 MG/ML injection Inject 160 mg into the skin every 14 (fourteen) days.     tiZANidine (ZANAFLEX) 4 MG tablet TAKE 1 TABLET(4 MG) BY MOUTH TWICE DAILY AS NEEDED FOR MUSCLE SPASMS 60 tablet 0   No current facility-administered medications on file prior to visit.     ROS see history of present illness  Objective  Physical Exam Vitals:   10/17/21 0839  BP: 110/79  Pulse: 82  Temp: 98.9 F (37.2 C)  SpO2: 98%    BP Readings from Last 3 Encounters:  10/17/21 110/79  07/10/21 (!) 157/91  07/02/21 (!) 150/88   Wt  Readings from Last 3 Encounters:  10/17/21 173 lb 12.8 oz (78.8 kg)  07/11/21 175 lb (79.4 kg)  07/09/21 173 lb 8 oz (78.7 kg)    Physical Exam Constitutional:      General: He is not in acute distress.    Appearance: He is not diaphoretic.  Cardiovascular:     Rate and Rhythm: Normal rate and regular rhythm.     Heart sounds: Normal heart sounds.  Pulmonary:     Effort: Pulmonary effort is normal.     Breath sounds: Normal breath sounds.  Skin:    General: Skin is warm and dry.  Neurological:     Mental Status: He is alert.      Assessment/Plan: Please see individual problem list.  Problem List Items Addressed This Visit     Generalized anxiety disorder (Chronic)    Well-controlled.  He can continue Xanax 0.5 mg 3 times daily as needed for anxiety.      Hypertension - Primary (Chronic)    Adequately controlled.  He will continue lisinopril 5 mg daily.      Relevant Medications   lisinopril (ZESTRIL) 5 MG tablet   Screening examination for STD (sexually transmitted disease)    Check labs as outlined below.      Relevant Orders   Hepatitis C Antibody   HIV antibody (with reflex)   RPR   Urine cytology ancillary only([place])   Status post total left knee replacement    Progressing as expected.  Discussed it can take up to a year for him to get full benefit from the knee replacement.  He will continue to see orthopedics.      Other Visit Diagnoses     Encounter for screening for HIV       Relevant Orders   HIV antibody (with reflex)      Patient requested that his lab work not be released to [employer] for privacy reasons.  He also requested that his note not be released to [facility].  Reminder placed on a sticky note in the patient's chart to not release these.  I did discuss that there is always the potential that the EMR could release them automatically despite me clicking the appropriate buttons within the orders.  Return in about 3 months (around  01/17/2022) for Anxiety.   [name], MD [place]

## 2021-10-17 NOTE — Assessment & Plan Note (Signed)
Progressing as expected.  Discussed it can take up to a year for him to get full benefit from the knee replacement.  He will continue to see orthopedics.

## 2021-10-17 NOTE — Assessment & Plan Note (Signed)
Adequately controlled.  He will continue lisinopril 5 mg daily.

## 2021-10-18 ENCOUNTER — Ambulatory Visit: Payer: Medicare Other | Attending: Orthopaedic Surgery

## 2021-10-18 ENCOUNTER — Encounter: Payer: Medicare Other | Admitting: Physical Therapy

## 2021-10-18 DIAGNOSIS — R262 Difficulty in walking, not elsewhere classified: Secondary | ICD-10-CM | POA: Diagnosis present

## 2021-10-18 DIAGNOSIS — M25662 Stiffness of left knee, not elsewhere classified: Secondary | ICD-10-CM | POA: Diagnosis not present

## 2021-10-18 DIAGNOSIS — M6281 Muscle weakness (generalized): Secondary | ICD-10-CM | POA: Diagnosis present

## 2021-10-18 LAB — HIV ANTIBODY (ROUTINE TESTING W REFLEX): HIV 1&2 Ab, 4th Generation: NONREACTIVE

## 2021-10-18 LAB — URINE CYTOLOGY ANCILLARY ONLY
Chlamydia: NEGATIVE
Comment: NEGATIVE
Comment: NEGATIVE
Comment: NORMAL
Neisseria Gonorrhea: NEGATIVE
Trichomonas: NEGATIVE

## 2021-10-18 LAB — HEPATITIS C ANTIBODY: Hepatitis C Ab: NONREACTIVE

## 2021-10-18 LAB — RPR: RPR Ser Ql: NONREACTIVE

## 2021-10-18 NOTE — Therapy (Signed)
OUTPATIENT PHYSICAL THERAPY TREATMENT/PT DISCHARGE  Patient Name: Gregory CARANDANG Sr. MRN: [id number] DOB:1965/12/06, 56 y.o., male Today's Date: 10/18/2021  PCP:   Leone Haven, MD   REFERRING PROVIDER:  Leandrew Koyanagi, MD   PT End of Session - 10/18/21 1436     Visit Number 17    Number of Visits 17    Date for PT Re-Evaluation 10/18/21    Authorization Type Medicare (traditional)    Authorization Time Period 07/26/21-10/18/21    Progress Note Due on Visit 20    PT Start Time 1430    PT Stop Time 1500    PT Time Calculation (min) 30 min    Activity Tolerance Patient tolerated treatment well;No increased pain    Behavior During Therapy WFL for tasks assessed/performed                   Past Medical History:  Diagnosis Date   Anxiety    Bronchitis    GERD (gastroesophageal reflux disease)    Hypertension    Insomnia    Low testosterone    Osteoarthritis    Past Surgical History:  Procedure Laterality Date   COLONOSCOPY WITH PROPOFOL N/A 01/24/2021   Procedure: COLONOSCOPY WITH PROPOFOL;  Surgeon: Lin Landsman, MD;  Location: [facility];  Service: Gastroenterology;  Laterality: N/A;   NASAL SINUS SURGERY     Dr. Carlis Abbott   TOTAL KNEE ARTHROPLASTY Left 07/09/2021   Procedure: LEFT TOTAL KNEE ARTHROPLASTY;  Surgeon: Leandrew Koyanagi, MD;  Location: [facility];  Service: Orthopedics;  Laterality: Left;   Unremarkable     WRIST SURGERY Right    Patient Active Problem List   Diagnosis Date Noted   Screening examination for STD (sexually transmitted disease) 10/17/2021   Status post left knee replacement 10/04/2021   Lumbar disc prolapse with compression radiculopathy 07/11/2021   Triceps strain 07/11/2021   Status post total left knee replacement 07/09/2021   DJD (degenerative joint disease) of knee 07/09/2021   Primary osteoarthritis of left knee 05/03/2021   Colon cancer screening    Polyp of colon    Musculoskeletal chest pain 10/31/2020   Fall  10/31/2020   Muscle cramps 07/27/2020   Positive ANA (antinuclear antibody) 06/09/2020   Left knee pain 04/19/2020   Biceps tendon tear 04/19/2020   Right shoulder pain 04/19/2020   Arthralgia 04/19/2020   Overweight 05/28/2019   TMJ dysfunction 08/13/2017   Right wrist pain 02/03/2017   Hypersomnia 04/22/2016   Rash and nonspecific skin eruption 04/15/2016   Leg cramps 04/15/2016   History of herpes genitalis 12/14/2014   Skin lesion 04/14/2013   Chronic low back pain 04/14/2013   Leukocytosis 08/04/2012   Generalized anxiety disorder 08/04/2012   Hypogonadism male 10/24/2011   Hypertension 09/24/2011   GERD (gastroesophageal reflux disease) 09/24/2011   Erectile dysfunction 09/24/2011    REFERRING DIAG: M17.12 (ICD-10-CM) - Primary osteoarthritis of left knee  THERAPY DIAG:  Stiffness of left knee, not elsewhere classified  Muscle weakness (generalized)  Difficulty in walking, not elsewhere classified  Rationale for Evaluation and Treatment Rehabilitation  PERTINENT HISTORY: Gregory Cozby Sr. is a 28 yoM who presents to [facility] 07/26/21 for rehabilization of Left TKA (07/08/21). Pt inititally had some pain management difficulty first postop week, but is improved now. He has been DC from [place] and has been working on his DC HEP.   PRECAUTIONS: TKE  SUBJECTIVE: Patient reports ongoing left knee pain and intermittent swelling. He reports having a  good visit with MD and states his MD is pleased with his progress. Patient reports he feels like physically he is doing okay but feels like his progress has plateued- doing well with ROM and walking- able to go up/down 16 steps multiple times a day to his man cave. He reports just waiting for pain and swelling to improve to minimal to none. He later states he is okay with discharge today from PT stating he knows he has met the original goals and functioning at an independent level. He continues to walk without a device and states he feels he  comfortable with discharge today.    PAIN:  Are you having pain? Yes: NPRS scale: 2-3/10 Pain location: L knee  Pain description: intermittent Aggravating factors: walking, too much stretching or exercises, sleeping (sometimes)   Relieving factors: ice, meds      TODAY'S TREATMENT 10/18/21 :   There.ex:  Reviewed knee stretches to be performed daily including seated knee stretching- flex/ext - hold 20 sec x 2 to ensure patient independent- No issues Next performed standing knee flex and ext stretches at steps with railing- hold 20 sec x 2 to demo independence.  Patient performed 6 min walk with 1550 feet without an AD and good reciprocal steps and no obvious limp.  Patient presented with 127 deg AROM left knee flex today and denied any increased pain at that moment.    PT provide education on what to expect at this phase of rehab process with pain and mobility.  Educated that he will need to stay in communication with MD regarding pain and any swelling. Advised him to continue with daily stretching, walking, but also to ice and elevate LE as needed. Patient verbalized understanding.       PATIENT EDUCATION: Education details:Pt educated throughout session about proper posture and technique with exercises. Improved exercise technique, movement at target joints, use of target muscles after min to mod verbal, visual, tactile cues.  Person educated: Patient Education method: Explanation, Demonstration, Tactile cues, and Verbal cues Education comprehension: verbalized understanding, returned demonstration, verbal cues required, and needs further education   HOME EXERCISE PROGRAM: Pt to continue HEP as previously given [id number] *Please update next visit (#11)    PT Short Term Goals -       PT SHORT TERM GOAL #1   Title Pt to demosntrate Left knee flexion ROM 10-100 degrees.    Baseline Eval: 9-75 Left knee flexion; 09/04/21: 125 degrees flexion.   Time 3    Period Weeks     Status Achieved    Target Date 08/16/21      PT SHORT TERM GOAL #2   Title Pt to demonstrate performance of 867ft AMB asisstive device ad lib without loss of symetry and without progression of antalgia, gait speed >1.79m/s.    Baseline Eval: 363ft SPC for part, 0.28m/s, minimal antalgia; 09/04/21: : >827ft    Time 4    Period Weeks    Status achieved   Target Date 08/23/21      PT SHORT TERM GOAL #3   Title 5xSTS <16sec hands-free from standard chair height    Baseline eval: needs elevation of seat and hands on knees for 25 sec; 09/04/21: 9.22sec arms crossed.    Time 4    Period Weeks    Status Achieved       Target Date 08/23/21              PT Long Term Goals -  PT LONG TERM GOAL #1   Title FOTO score improvement >14 points to indicate reduced difficulty in lifting groceries, light household actiivties, heavy household activities, typical housework, school work, or hobbies.    Baseline Eval: 40; 09/04/21: 62   Time 8    Period Weeks    Status Achieved      Target Date 09/20/21      PT LONG TERM GOAL #2   Title Pt to improve tolerance of comfortable sitting to >2 hours to be able to go see a movie with family.    Baseline Eval: 3 minutes; 8/29: could make it 2 hours with weight shifting.    Time 8    Period Weeks    Status Achieved     Target Date 09/20/21      PT LONG TERM GOAL #3   Title Pt to demonstrate Lt knee flexion ROM <10->120 degrees to facilitate tolerance of basic sitting postures required for ADL, IADL.    Baseline eval: 9-75 degrees; 09/04/21: 125; 10/18/2021= 127 deg   Time 8    Period Weeks    Status Achieved     Target Date 09/20/21      PT LONG TERM GOAL #4   Title Pt to demonstrate tolerance of 6MWT >1541ft with good symetry, no LOB, AD ad lib, no antalgia.    Baseline eval: 374ft; 8/29: 1466ft; 10/18/2021 1550 feet   Time 8    Period Weeks    Status Achieved   Target Date 10/18/2021     PT LONG TERM GOAL #5   Title 5xSTS< 12sec  hands free, standard surface height to improve tolerance of typical daily mobility needs.    Baseline 8/29 <10 sec hands free    Time 10    Period Weeks    Status Achieved     Target Date 10/04/21              Plan -    Clinical Impression Statement Patient presents in good spirits for discharge visit today. He has made great functional progress during his episode of care- improving ROM, pain report, functional strength and mobility. He achieved his functional endurance goal today exhibiting no gait abnormalities. Reviewed stretching HEP and importance and changing position. Patient verbalized good understanding. He is appropriate for discharge today with all goals met and patient to continue on his own at this time. Instructed him to contact his Ortho MD if any issues.    Personal Factors and Comorbidities Behavior Pattern;Fitness;Time since onset of injury/illness/exacerbation    Examination-Activity Limitations Carry;Locomotion Level;Squat;Stand;Transfers    Examination-Participation Restrictions Driving;Interpersonal Relationship;Yard Work;Community Activity;Cleaning;Laundry    Stability/Clinical Decision Making Stable/Uncomplicated    Rehab Potential Excellent    PT Frequency 2x / week    PT Duration 12 weeks    PT Treatment/Interventions ADLs/Self Care Home Management;Electrical Stimulation;Moist Heat;Cryotherapy;DME Instruction;Gait training;Stair training;Functional mobility training;Therapeutic activities;Therapeutic exercise;Balance training;Neuromuscular re-education;Patient/family education;Prosthetic Training;Compression bandaging;Scar mobilization;Passive range of motion;Dry needling    PT Next Visit Plan Progress strengthening   PT Home Exercise Plan eval: reviewed quad set, SLR, SAQ (recommended elevated bolster to 45 degree start), added in hooklying bridge as new one. Did not have time to review all others    Consulted and Agree with Plan of Care Patient              Lewis Moccasin PT   Physical Therapist - [place] [phone]     10/18/2021, 3:27  PM

## 2021-10-23 ENCOUNTER — Ambulatory Visit: Payer: Medicare Other

## 2021-10-23 ENCOUNTER — Encounter: Payer: Medicare Other | Admitting: Physical Therapy

## 2021-10-24 ENCOUNTER — Other Ambulatory Visit: Payer: Self-pay | Admitting: Family Medicine

## 2021-10-24 DIAGNOSIS — F411 Generalized anxiety disorder: Secondary | ICD-10-CM

## 2021-10-25 ENCOUNTER — Encounter: Payer: Medicare Other | Admitting: Physical Therapy

## 2021-10-27 ENCOUNTER — Other Ambulatory Visit: Payer: Self-pay | Admitting: Family Medicine

## 2021-10-30 ENCOUNTER — Encounter: Payer: Medicare Other | Admitting: Physical Therapy

## 2021-11-01 ENCOUNTER — Encounter: Payer: Medicare Other | Admitting: Physical Therapy

## 2021-11-06 ENCOUNTER — Encounter: Payer: Medicare Other | Admitting: Physical Therapy

## 2021-11-08 ENCOUNTER — Encounter: Payer: Medicare Other | Admitting: Physical Therapy

## 2021-11-13 ENCOUNTER — Encounter: Payer: Medicare Other | Admitting: Physical Therapy

## 2021-11-15 ENCOUNTER — Encounter: Payer: Medicare Other | Admitting: Physical Therapy

## 2021-11-23 ENCOUNTER — Other Ambulatory Visit: Payer: Self-pay | Admitting: Family Medicine

## 2021-11-23 DIAGNOSIS — F411 Generalized anxiety disorder: Secondary | ICD-10-CM

## 2021-11-26 NOTE — Telephone Encounter (Signed)
Refill sent to pharmacy.  He needs to be scheduled for follow-up sometime in January.  Please call him to get this scheduled.

## 2021-12-09 ENCOUNTER — Other Ambulatory Visit: Payer: Self-pay | Admitting: Family Medicine

## 2021-12-09 DIAGNOSIS — G8929 Other chronic pain: Secondary | ICD-10-CM

## 2021-12-24 ENCOUNTER — Ambulatory Visit (INDEPENDENT_AMBULATORY_CARE_PROVIDER_SITE_OTHER): Payer: Medicare Other

## 2021-12-24 VITALS — Ht 71.0 in | Wt 173.0 lb

## 2021-12-24 DIAGNOSIS — Z0000 Encounter for general adult medical examination without abnormal findings: Secondary | ICD-10-CM | POA: Diagnosis not present

## 2021-12-24 NOTE — Patient Instructions (Addendum)
Gregory Stokes , Thank you for taking time to come for your Medicare Wellness Visit. I appreciate your ongoing commitment to your health goals. Please review the following plan we discussed and let me know if I can assist you in the future.   These are the goals we discussed:  Goals       Patient Stated     Maintain healthy lifestyle (pt-stated)      Keep stretched for exercise Stay hydrated; increase water intake         This is a list of the screening recommended for you and due dates:  Health Maintenance  Topic Date Due   Zoster (Shingles) Vaccine (1 of 2) 03/25/2022*   Flu Shot  04/07/2022*   Medicare Annual Wellness Visit  12/25/2022   Colon Cancer Screening  01/25/2024   Hepatitis C Screening: USPSTF Recommendation to screen - Ages 18-79 yo.  Completed   HIV Screening  Completed   HPV Vaccine  Aged Out   DTaP/Tdap/Td vaccine  Discontinued   COVID-19 Vaccine  Discontinued  *Topic was postponed. The date shown is not the original due date.    Advanced directives:End of life planning; Advanced aging; Advanced directives discussed.  No HCPOA/Living Will.  Additional information available in office. Declined at this time.  Next appointment: Follow up in one year for your annual wellness visit   Preventive Care 40-64 Years, Male Preventive care refers to lifestyle choices and visits with your health care provider that can promote health and wellness. What does preventive care include? A yearly physical exam. This is also called an annual well check. Dental exams once or twice a year. Routine eye exams. Ask your health care provider how often you should have your eyes checked. Personal lifestyle choices, including: Daily care of your teeth and gums. Regular physical activity. Eating a healthy diet. Avoiding tobacco and drug use. Limiting alcohol use. Practicing safe sex. Taking low-dose aspirin every day starting at age 4. What happens during an annual well check? The  services and screenings done by your health care provider during your annual well check will depend on your age, overall health, lifestyle risk factors, and family history of disease. Counseling  Your health care provider may ask you questions about your: Alcohol use. Tobacco use. Drug use. Emotional well-being. Home and relationship well-being. Sexual activity. Eating habits. Work and work Statistician. Screening  You may have the following tests or measurements: Height, weight, and BMI. Blood pressure. Lipid and cholesterol levels. These may be checked every 5 years, or more frequently if you are over 29 years old. Skin check. Lung cancer screening. You may have this screening every year starting at age 52 if you have a 30-pack-year history of smoking and currently smoke or have quit within the past 15 years. Fecal occult blood test (FOBT) of the stool. You may have this test every year starting at age 51. Flexible sigmoidoscopy or colonoscopy. You may have a sigmoidoscopy every 5 years or a colonoscopy every 10 years starting at age 7. Prostate cancer screening. Recommendations will vary depending on your family history and other risks. Hepatitis C blood test. Hepatitis B blood test. Sexually transmitted disease (STD) testing. Diabetes screening. This is done by checking your blood sugar (glucose) after you have not eaten for a while (fasting). You may have this done every 1-3 years. Discuss your test results, treatment options, and if necessary, the need for more tests with your health care provider. Vaccines  Your health care  provider may recommend certain vaccines, such as: Influenza vaccine. This is recommended every year. Tetanus, diphtheria, and acellular pertussis (Tdap, Td) vaccine. You may need a Td booster every 10 years. Zoster vaccine. You may need this after age 60. Pneumococcal 13-valent conjugate (PCV13) vaccine. You may need this if you have certain conditions and  have not been vaccinated. Pneumococcal polysaccharide (PPSV23) vaccine. You may need one or two doses if you smoke cigarettes or if you have certain conditions. Talk to your health care provider about which screenings and vaccines you need and how often you need them. This information is not intended to replace advice given to you by your health care provider. Make sure you discuss any questions you have with your health care provider. Document Released: 01/20/2015 Document Revised: 09/13/2015 Document Reviewed: 10/25/2014 Elsevier Interactive Patient Education  2017 [place] Prevention in the Home Falls can cause injuries. They can happen to people of all ages. There are many things you can do to make your home safe and to help prevent falls. What can I do on the outside of my home? Regularly fix the edges of walkways and driveways and fix any cracks. Remove anything that might make you trip as you walk through a door, such as a raised step or threshold. Trim any bushes or trees on the path to your home. Use bright outdoor lighting. Clear any walking paths of anything that might make someone trip, such as rocks or tools. Regularly check to see if handrails are loose or broken. Make sure that both sides of any steps have handrails. Any raised decks and porches should have guardrails on the edges. Have any leaves, snow, or ice cleared regularly. Use sand or salt on walking paths during winter. Clean up any spills in your garage right away. This includes oil or grease spills. What can I do in the bathroom? Use night lights. Install grab bars by the toilet and in the tub and shower. Do not use towel bars as grab bars. Use non-skid mats or decals in the tub or shower. If you need to sit down in the shower, use a plastic, non-slip stool. Keep the floor dry. Clean up any water that spills on the floor as soon as it happens. Remove soap buildup in the tub or shower regularly. Attach  bath mats securely with double-sided non-slip rug tape. Do not have throw rugs and other things on the floor that can make you trip. What can I do in the bedroom? Use night lights. Make sure that you have a light by your bed that is easy to reach. Do not use any sheets or blankets that are too big for your bed. They should not hang down onto the floor. Have a firm chair that has side arms. You can use this for support while you get dressed. Do not have throw rugs and other things on the floor that can make you trip. What can I do in the kitchen? Clean up any spills right away. Avoid walking on wet floors. Keep items that you use a lot in easy-to-reach places. If you need to reach something above you, use a strong step stool that has a grab bar. Keep electrical cords out of the way. Do not use floor polish or wax that makes floors slippery. If you must use wax, use non-skid floor wax. Do not have throw rugs and other things on the floor that can make you trip. What can I do with my  stairs? Do not leave any items on the stairs. Make sure that there are handrails on both sides of the stairs and use them. Fix handrails that are broken or loose. Make sure that handrails are as long as the stairways. Check any carpeting to make sure that it is firmly attached to the stairs. Fix any carpet that is loose or worn. Avoid having throw rugs at the top or bottom of the stairs. If you do have throw rugs, attach them to the floor with carpet tape. Make sure that you have a light switch at the top of the stairs and the bottom of the stairs. If you do not have them, ask someone to add them for you. What else can I do to help prevent falls? Wear shoes that: Do not have high heels. Have rubber bottoms. Are comfortable and fit you well. Are closed at the toe. Do not wear sandals. If you use a stepladder: Make sure that it is fully opened. Do not climb a closed stepladder. Make sure that both sides of the  stepladder are locked into place. Ask someone to hold it for you, if possible. Clearly mark and make sure that you can see: Any grab bars or handrails. First and last steps. Where the edge of each step is. Use tools that help you move around (mobility aids) if they are needed. These include: Canes. Walkers. Scooters. Crutches. Turn on the lights when you go into a dark area. Replace any light bulbs as soon as they burn out. Set up your furniture so you have a clear path. Avoid moving your furniture around. If any of your floors are uneven, fix them. If there are any pets around you, be aware of where they are. Review your medicines with your doctor. Some medicines can make you feel dizzy. This can increase your chance of falling. Ask your doctor what other things that you can do to help prevent falls. This information is not intended to replace advice given to you by your health care provider. Make sure you discuss any questions you have with your health care provider. Document Released: 10/20/2008 Document Revised: 06/01/2015 Document Reviewed: 01/28/2014 Elsevier Interactive Patient Education  2017 [employer].

## 2021-12-24 NOTE — Progress Notes (Signed)
Subjective:   Gregory Drown Sr. is a 56 y.o. male who presents for Medicare Annual/Subsequent preventive examination.  Review of Systems    No ROS.  Medicare Wellness Virtual Visit.  Visual/audio telehealth visit, UTA vital signs.   See social history for additional risk factors.   Cardiac Risk Factors include: advanced age (>22mn, >>5women);male gender;hypertension     Objective:    Today's Vitals   12/24/21 0850  Weight: 173 lb (78.5 kg)  Height: '5\' 11"'$  (1.803 m)   Body mass index is 24.13 kg/m.     12/24/2021    8:47 AM 07/09/2021   12:19 PM 07/02/2021    8:26 AM 01/24/2021    9:18 AM 12/22/2020    2:20 PM 12/22/2019    2:24 PM 08/12/2015    7:06 PM  Advanced Directives  Does Patient Have a Medical Advance Directive? No No No No No No No  Would patient like information on creating a medical advance directive? No - Patient declined No - Patient declined No - Patient declined  No - Patient declined No - Patient declined No - patient declined information    Current Medications (verified) Outpatient Encounter Medications as of 12/24/2021  Medication Sig   albuterol (PROVENTIL HFA;VENTOLIN HFA) 108 (90 Base) MCG/ACT inhaler Inhale 1-2 puffs into the lungs every 6 (six) hours as needed for wheezing or shortness of breath.   ALPRAZolam (XANAX) 0.5 MG tablet TAKE 1 TABLET(0.5 MG) BY MOUTH THREE TIMES DAILY AS NEEDED FOR ANXIETY   amoxicillin (AMOXIL) 500 MG capsule Take 4 pills one hour prior to dental work   aspirin EC 81 MG tablet Take 1 tablet (81 mg total) by mouth in the morning and at bedtime. To be taken after surgery to prevent blood clots.  Swallow whole.   Avanafil (STENDRA) 200 MG TABS TAKE 1/2 TABLET BY MOUTH AS DIRECTED   celecoxib (CELEBREX) 200 MG capsule Take 1 capsule (200 mg total) by mouth 2 (two) times daily as needed.   docusate sodium (COLACE) 100 MG capsule Take 1 capsule (100 mg total) by mouth daily as needed.   gabapentin (NEURONTIN) 400 MG capsule  TAKE 2 CAPSULES(800 MG) BY MOUTH THREE TIMES DAILY   HYDROcodone-acetaminophen (NORCO) 5-325 MG tablet Take 1 tablet by mouth daily as needed. (Patient not taking: Reported on 12/24/2021)   ketorolac (TORADOL) 10 MG tablet Take 1 tablet (10 mg total) by mouth 2 (two) times daily as needed.   lisinopril (ZESTRIL) 5 MG tablet Take 1 tablet (5 mg total) by mouth daily.   Multiple Vitamin (MULTIVITAMIN) tablet Take 1 tablet by mouth daily.   omeprazole (PRILOSEC) 40 MG capsule TAKE ONE CAPSULE BY MOUTH DAILY   ondansetron (ZOFRAN) 4 MG tablet Take 1 tablet (4 mg total) by mouth every 8 (eight) hours as needed for nausea or vomiting.   oxyCODONE-acetaminophen (PERCOCET) 10-325 MG tablet Take 1 tablet by mouth 2 (two) times daily as needed for pain. (Patient not taking: Reported on 12/24/2021)   testosterone cypionate (DEPOTESTOSTERONE CYPIONATE) 200 MG/ML injection Inject 160 mg into the skin every 14 (fourteen) days.   tiZANidine (ZANAFLEX) 4 MG tablet TAKE 1 TABLET(4 MG) BY MOUTH TWICE DAILY AS NEEDED FOR MUSCLE SPASMS   No facility-administered encounter medications on file as of 12/24/2021.    Allergies (verified) Tessalon perles [benzonatate]   History: Past Medical History:  Diagnosis Date   Anxiety    Bronchitis    GERD (gastroesophageal reflux disease)    Hypertension  Insomnia    Low testosterone    Osteoarthritis    Past Surgical History:  Procedure Laterality Date   COLONOSCOPY WITH PROPOFOL N/A 01/24/2021   Procedure: COLONOSCOPY WITH PROPOFOL;  Surgeon: Lin Landsman, MD;  [facility] END[facility]COPY;  Service: Gastroenterology;  Laterality: N/A;   NASAL SINUS SURGERY     Dr. Carlis Abbott   TOTAL KNEE ARTHROPLASTY Left 07/09/2021   Procedure: LEFT TOTAL KNEE ARTHROPLASTY;  Surgeon: Leandrew Koyanagi, MD;  Location: [place];  Service: Orthopedics;  Laterality: Left;   Unremarkable     WRIST SURGERY Right    Family History  Problem Relation Age of Onset   Lung cancer Father         deceased   Parkinson's disease Mother    Social History   Socioeconomic History   Marital status: Married    Spouse name: Not on file   Number of children: 1   Years of education: Not on file   Highest education level: Not on file  Occupational History   Not on file  Tobacco Use   Smoking status: Former    Types: Cigarettes    Quit date: 2010    Years since quitting: 13.9   Smokeless tobacco: Never   Tobacco comments:    smoked age [age] stopped then age 56 x 1 years max 1/2 ppd   Vaping Use   Vaping Use: Never used  Substance and Sexual Activity   Alcohol use: No    Alcohol/week: 0.0 standard drinks of alcohol   Drug use: No   Sexual activity: Yes  Other Topics Concern   Not on file  Social History Narrative   Lives in [place] with wife      Work - Designer, [employer]   Social Determinants of Health   Financial Resource Strain: Low Risk  (12/24/2021)   Overall Financial Resource Strain (CARDIA)    Difficulty of Paying Living Expenses: Not hard at all  Food Insecurity: No Food Insecurity (12/24/2021)   Hunger Vital Sign    Worried About Running Out of Food in the Last Year: Never true    [place] in the Last Year: Never true  Transportation Needs: No Transportation Needs (12/24/2021)   PRAPARE - Hydrologist (Medical): No    Lack of Transportation (Non-Medical): No  Physical Activity: Insufficiently Active (12/24/2021)   Exercise Vital Sign    Days of Exercise per Week: 7 days    Minutes of Exercise per Session: 20 min  Stress: No Stress Concern Present (12/24/2021)   [place]    Feeling of Stress : Not at all  Social Connections: Unknown (12/24/2021)   Social Connection and Isolation Panel [NHANES]    Frequency of Communication with Friends and Family: Not on file    Frequency of Social Gatherings with Friends and Family: Not on file    Attends Religious  Services: Not on file    Active Member of Clubs or Organizations: Not on file    Attends Archivist Meetings: Not on file    Marital Status: Married    Tobacco Counseling Counseling given: Not Answered Tobacco comments: smoked age [age] stopped then age 6 x 1 years max 1/2 ppd    Clinical Intake:  Pre-visit preparation completed: Yes        Diabetes: No  How often do you need to have someone help you when you read instructions, pamphlets,  or other written materials from your doctor or pharmacy?: 1 - Never    Interpreter Needed?: No      Activities of Daily Living    12/24/2021    8:48 AM 07/09/2021   12:19 PM  In your present state of health, do you have any difficulty performing the following activities:  Hearing? 0 0  Vision? 0 0  Difficulty concentrating or making decisions? 0 0  Walking or climbing stairs? 1 0  Comment Cane in use as needed. Recent knee surgery that still bothers him a little.   Dressing or bathing? 0 0  Doing errands, shopping? 1 0  Comment Wife assist   Preparing Food and eating ? N   Using the Toilet? N   In the past six months, have you accidently leaked urine? N   Do you have problems with loss of bowel control? N   Managing your Medications? Y   Comment Wife manages   Managing your Finances? Y   Comment Wife manages   Housekeeping or managing your Housekeeping? N     Patient Care Team: Leone Haven, MD as PCP - General (Family Medicine)  Indicate any recent Medical Services you may have received from other than [facility] providers in the past year (date may be approximate).     Assessment:   This is a routine wellness examination for Ramelo.  I connected with  Gregory Drown Sr. on 12/24/21 by a audio enabled telemedicine application and verified that I am speaking with the correct person using two identifiers.  Patient Location: Home  Provider Location: Office/Clinic  I discussed the limitations of evaluation  and management by telemedicine. The patient expressed understanding and agreed to proceed.   Hearing/Vision screen Hearing Screening - Comments:: Patient is able to hear conversational tones without difficulty. No issues reported. Vision Screening - Comments:: Followed by [facility] in [place] Wears corrective lenses They have seen their ophthalmologist in the last 12 months.    Dietary issues and exercise activities discussed: Current Exercise Habits: Home exercise routine, Type of exercise: stretching, Time (Minutes): 20, Frequency (Times/Week): 7, Weekly Exercise (Minutes/Week): 140, Intensity: Mild Regular diet   Goals Addressed               This Visit's Progress     Patient Stated     Maintain healthy lifestyle (pt-stated)        Keep stretched for exercise Stay hydrated; increase water intake        Depression Screen    12/24/2021    8:48 AM 10/17/2021    8:41 AM 07/11/2021    8:02 AM 04/06/2021    9:16 AM 12/22/2020    2:18 PM 10/31/2020    8:55 AM 04/19/2020   10:44 AM  PHQ 2/9 Scores  PHQ - 2 Score 0 0 0 0 0 0 0    Fall Risk    12/24/2021    8:48 AM 10/17/2021    8:41 AM 04/06/2021    9:16 AM 12/22/2020    2:37 PM 10/31/2020    8:55 AM  Fall Risk   Falls in the past year? 0 0 0 0 0  Number falls in past yr: 0 0 0 0 0  Injury with Fall? 0 0 0    Risk for fall due to : Impaired balance/gait No Fall Risks No Fall Risks    Follow up Falls evaluation completed;Falls prevention discussed Falls evaluation completed Falls evaluation completed Falls evaluation completed Falls evaluation completed  FALL RISK PREVENTION PERTAINING TO THE HOME: Home free of loose throw rugs in walkways, pet beds, electrical cords, etc? Yes  Adequate lighting in your home to reduce risk of falls? Yes   ASSISTIVE DEVICES UTILIZED TO PREVENT FALLS: Life alert? No  Use of a cane, walker or w/c? Yes , cane in use  Grab bars in the bathroom? No  Shower chair or bench in shower?  Yes  Elevated toilet seat or a handicapped toilet? No   TIMED UP AND GO: Was the test performed? No .   Cognitive Function:        12/24/2021    9:05 AM 12/22/2020    2:46 PM  6CIT Screen  What Year? 0 points 0 points  What month? 0 points 0 points  What time? 0 points 0 points  Count back from 20 0 points   Months in reverse 4 points   Repeat phrase 0 points   Total Score 4 points     Immunizations Immunization History  Administered Date(s) Administered   Influenza,inj,Quad PF,6+ Mos 01/30/2015   Tdap 09/23/2009   TDAP status: Due, Education has been provided regarding the importance of this vaccine. Advised may receive this vaccine at local pharmacy or Health Dept. Aware to provide a copy of the vaccination record if obtained from local pharmacy or Health Dept. Verbalized acceptance and understanding.  Shingrix Completed?: No.    Education has been provided regarding the importance of this vaccine. Patient has been advised to call insurance company to determine out of pocket expense if they have not yet received this vaccine. Advised may also receive vaccine at local pharmacy or Health Dept. Verbalized acceptance and understanding.  Screening Tests Health Maintenance  Topic Date Due   Zoster Vaccines- Shingrix (1 of 2) 03/25/2022 (Originally 05/29/2015)   INFLUENZA VACCINE  04/07/2022 (Originally 08/07/2021)   Medicare Annual Wellness (AWV)  12/25/2022   COLONOSCOPY (Pts 45-32yr Insurance coverage will need to be confirmed)  01/25/2024   Hepatitis C Screening  Completed   HIV Screening  Completed   HPV VACCINES  Aged Out   DTaP/Tdap/Td  Discontinued   COVID-19 Vaccine  Discontinued   Health Maintenance There are no preventive care reminders to display for this patient.  Lung Cancer Screening: (Low Dose CT Chest recommended if Age 56-80years, 30 pack-year currently smoking OR have quit w/in 15years.) does not qualify.   Hepatitis C Screening: Completed  10/2021.  Vision Screening: Recommended annual ophthalmology exams for early detection of glaucoma and other disorders of the eye.  Dental Screening: Recommended annual dental exams for proper oral hygiene.  Community Resource Referral / Chronic Care Management: CRR required this visit?  No   CCM required this visit?  No      Plan:     I have personally reviewed and noted the following in the patient's chart:   Medical and social history Use of alcohol, tobacco or illicit drugs  Current medications and supplements including opioid prescriptions. Patient is not currently taking opioid prescriptions. Reports not taking hydrocodone or oxycodone.  Functional ability and status Nutritional status Physical activity Advanced directives List of other physicians Hospitalizations, surgeries, and ER visits in previous 12 months Vitals Screenings to include cognitive, depression, and falls Referrals and appointments  In addition, I have reviewed and discussed with patient certain preventive protocols, quality metrics, and best practice recommendations. A written personalized care plan for preventive services as well as general preventive health recommendations were provided to patient.  Leta Jungling, LPN   [id number]

## 2022-01-02 ENCOUNTER — Other Ambulatory Visit: Payer: Self-pay | Admitting: Family Medicine

## 2022-01-02 DIAGNOSIS — F411 Generalized anxiety disorder: Secondary | ICD-10-CM

## 2022-01-02 NOTE — Telephone Encounter (Signed)
LMTCB. Need to find out if pt is completely out of medication or if he has enough to get him through till Dr. Caryl Bis returns next week.

## 2022-01-03 NOTE — Telephone Encounter (Signed)
Refill sent to pharmacy. He needs to be scheduled for follow-up some time in the next 1-2 months.

## 2022-01-04 ENCOUNTER — Ambulatory Visit: Payer: Medicare Other | Admitting: Physician Assistant

## 2022-01-28 ENCOUNTER — Other Ambulatory Visit: Payer: Self-pay | Admitting: Family Medicine

## 2022-01-28 DIAGNOSIS — I10 Essential (primary) hypertension: Secondary | ICD-10-CM

## 2022-01-29 ENCOUNTER — Other Ambulatory Visit: Payer: Self-pay | Admitting: Family Medicine

## 2022-01-29 DIAGNOSIS — F411 Generalized anxiety disorder: Secondary | ICD-10-CM

## 2022-01-30 NOTE — Telephone Encounter (Signed)
Patient is due for follow-up. Please call him to get him scheduled and then I will send in a refill. Follow-up can be virtual for this visit if he prefers.

## 2022-01-30 NOTE — Telephone Encounter (Signed)
Please call him back to get him scheduled.

## 2022-01-30 NOTE — Telephone Encounter (Signed)
LM for pt to cb to sched

## 2022-01-31 ENCOUNTER — Other Ambulatory Visit: Payer: Self-pay | Admitting: Family Medicine

## 2022-01-31 DIAGNOSIS — F411 Generalized anxiety disorder: Secondary | ICD-10-CM

## 2022-01-31 NOTE — Telephone Encounter (Signed)
Noted. Sent to pharmacy.  

## 2022-02-07 ENCOUNTER — Other Ambulatory Visit: Payer: Self-pay | Admitting: Family Medicine

## 2022-02-07 MED ORDER — TIZANIDINE HCL 4 MG PO TABS: 4.0000 mg | ORAL_TABLET | Freq: Two times a day (BID) | ORAL | 0 refills | Status: DC

## 2022-02-12 ENCOUNTER — Encounter: Payer: Self-pay | Admitting: Family Medicine

## 2022-02-12 ENCOUNTER — Ambulatory Visit (INDEPENDENT_AMBULATORY_CARE_PROVIDER_SITE_OTHER): Payer: Medicare Other | Admitting: Family Medicine

## 2022-02-12 VITALS — BP 118/70 | HR 81 | Temp 98.5°F | Ht 71.0 in | Wt 178.8 lb

## 2022-02-12 DIAGNOSIS — F411 Generalized anxiety disorder: Secondary | ICD-10-CM

## 2022-02-12 DIAGNOSIS — M5441 Lumbago with sciatica, right side: Secondary | ICD-10-CM | POA: Diagnosis not present

## 2022-02-12 DIAGNOSIS — Z96652 Presence of left artificial knee joint: Secondary | ICD-10-CM

## 2022-02-12 DIAGNOSIS — I10 Essential (primary) hypertension: Secondary | ICD-10-CM

## 2022-02-12 DIAGNOSIS — E663 Overweight: Secondary | ICD-10-CM

## 2022-02-12 DIAGNOSIS — G8929 Other chronic pain: Secondary | ICD-10-CM

## 2022-02-12 DIAGNOSIS — J989 Respiratory disorder, unspecified: Secondary | ICD-10-CM | POA: Insufficient documentation

## 2022-02-12 DIAGNOSIS — Z125 Encounter for screening for malignant neoplasm of prostate: Secondary | ICD-10-CM

## 2022-02-12 MED ORDER — TETANUS-DIPHTHERIA TOXOIDS TD 5-2 LFU IM INJ: 0.5000 mL | INJECTION | Freq: Once | INTRAMUSCULAR | 0 refills | Status: AC

## 2022-02-12 NOTE — Patient Instructions (Signed)
Nice to see you. We could try diclofenac or meloxicam to help with your back pain if desired in the future.  For now you can continue Tylenol 1000 mg every 8 hours as needed for pain. We will get lab work at the beginning of April.

## 2022-02-12 NOTE — Progress Notes (Signed)
[name], MD Phone: [phone]  Gregory Drown Sr. is a 57 y.o. male who presents today for f/u  HYPERTENSION Disease Monitoring Home BP Monitoring not checking Chest pain- no    Dyspnea- no Medications Compliance-  taking lisinopril.  Edema- no BMET    Component Value Date/Time   NA 142 07/02/2021 0855   K 4.5 07/02/2021 0855   CL 110 07/02/2021 0855   CO2 27 07/02/2021 0855   GLUCOSE 89 07/02/2021 0855   BUN 12 07/02/2021 0855   CREATININE 1.10 07/02/2021 0855   CALCIUM 9.4 07/02/2021 0855   GFRNONAA >60 07/02/2021 0855   GFRAA >60 09/07/2017 1229   Anxiety: Patient notes this is stable.  He has good days and bad days.  He continues to take Xanax.  No depression.  He rarely drinks alcohol.  Chronic back pain: This is a chronic ongoing issue.  He notes at times it feels "splintering."  Notes if he sits for a long period of time he will feel stiff when he gets up and it affects his gait.  He notes no radiation of the pain.  He has been off of chronic narcotics since about 2018.  He has not had imaging in quite some time.  Tylenol is helpful when he takes it.  Left knee replacement: Patient is doing progressively better with this.  Respiratory infection: Patient reports he had a respiratory infection started 3 weeks ago.  It is progressively getting better.  He notes minimal congestion in his sinuses at this time.  Social History   Tobacco Use  Smoking Status Former   Types: Cigarettes   Quit date: 2010   Years since quitting: 14.1  Smokeless Tobacco Never  Tobacco Comments   smoked age [age] stopped then age 54 x 1 years max 1/2 ppd     Current Outpatient Medications on File Prior to Visit  Medication Sig Dispense Refill   albuterol (PROVENTIL HFA;VENTOLIN HFA) 108 (90 Base) MCG/ACT inhaler Inhale 1-2 puffs into the lungs every 6 (six) hours as needed for wheezing or shortness of breath. 1 Inhaler 1   ALPRAZolam (XANAX) 0.5 MG tablet TAKE 1 TABLET(0.5 MG) BY  MOUTH THREE TIMES DAILY AS NEEDED FOR ANXIETY 90 tablet 0   amoxicillin (AMOXIL) 500 MG capsule Take 4 pills one hour prior to dental work 8 capsule 2   aspirin EC 81 MG tablet Take 1 tablet (81 mg total) by mouth in the morning and at bedtime. To be taken after surgery to prevent blood clots.  Swallow whole. 84 tablet 2   Avanafil (STENDRA) 200 MG TABS TAKE 1/2 TABLET BY MOUTH AS DIRECTED     docusate sodium (COLACE) 100 MG capsule Take 1 capsule (100 mg total) by mouth daily as needed. 30 capsule 2   gabapentin (NEURONTIN) 400 MG capsule TAKE 2 CAPSULES(800 MG) BY MOUTH THREE TIMES DAILY 540 capsule 1   lisinopril (ZESTRIL) 5 MG tablet Take 1 tablet (5 mg total) by mouth daily.     Multiple Vitamin (MULTIVITAMIN) tablet Take 1 tablet by mouth daily.     omeprazole (PRILOSEC) 40 MG capsule TAKE ONE CAPSULE BY MOUTH DAILY 150 capsule 1   ondansetron (ZOFRAN) 4 MG tablet Take 1 tablet (4 mg total) by mouth every 8 (eight) hours as needed for nausea or vomiting. 40 tablet 0   testosterone cypionate (DEPOTESTOSTERONE CYPIONATE) 200 MG/ML injection Inject 160 mg into the skin every 14 (fourteen) days.     tiZANidine (ZANAFLEX) 4 MG tablet  Take 1 tablet (4 mg total) by mouth 2 (two) times daily. 60 tablet 0   No current facility-administered medications on file prior to visit.     ROS see history of present illness  Objective  Physical Exam Vitals:   02/12/22 0807  BP: 118/70  Pulse: 81  Temp: 98.5 F (36.9 C)  SpO2: 95%    BP Readings from Last 3 Encounters:  02/12/22 118/70  10/17/21 110/79  07/10/21 (!) 157/91   Wt Readings from Last 3 Encounters:  02/12/22 178 lb 12.8 oz (81.1 kg)  12/24/21 173 lb (78.5 kg)  10/17/21 173 lb 12.8 oz (78.8 kg)    Physical Exam Constitutional:      General: He is not in acute distress.    Appearance: He is not diaphoretic.  Cardiovascular:     Rate and Rhythm: Normal rate and regular rhythm.     Heart sounds: Normal heart sounds.   Pulmonary:     Effort: Pulmonary effort is normal.     Breath sounds: Normal breath sounds.  Musculoskeletal:     Comments: No midline spine tenderness, no midline spine step-off, no muscular back tenderness  Skin:    General: Skin is warm and dry.  Neurological:     Mental Status: He is alert.     Comments: 5/5 strength bilateral quads, hamstrings, plantarflexion, and dorsiflexion, sensation to light touch intact bilateral lower extremities      Assessment/Plan: Please see individual problem list.  Primary hypertension Assessment & Plan: Chronic issue.  Adequately controlled.  Continue lisinopril 5 mg daily.  He will return for labs at the end of March.  Orders: -     Comprehensive metabolic panel -     Lipid panel  Generalized anxiety disorder Assessment & Plan: Chronic issue.  Stable.  He can continue Xanax 0.5 mg 3 times daily as needed for anxiety.   Chronic right-sided low back pain with right-sided sciatica Assessment & Plan: Chronic issue.  He can continue Tylenol 1000 mg every 8 hours as needed for pain.  Discussed we could try meloxicam or diclofenac in the future if he would like.   Status post total left knee replacement Assessment & Plan: Patient reports this is progressively improving.   Respiratory illness Assessment & Plan: Patient notes this is significantly improved.  Discussed may take another week or so to resolve completely.  He will let us know if it worsens or if it does not resolve.   Overweight  Prostate cancer screening -     PSA, Medicare  Other orders -     Tetanus-Diphtheria Toxoids Td; Inject 0.5 mLs into the muscle once for 1 dose.  Dispense: 0.5 mL; Refill: 0     Health Maintenance: Patient will get his tetanus vaccine at the pharmacy.  Return in about 2 months (around 04/13/2022) for Labs, follow-up with PCP in 3 months.   [name], MD [place]

## 2022-02-12 NOTE — Assessment & Plan Note (Signed)
Patient reports this is progressively improving.

## 2022-02-12 NOTE — Assessment & Plan Note (Signed)
Chronic issue.  Stable.  He can continue Xanax 0.5 mg 3 times daily as needed for anxiety.

## 2022-02-12 NOTE — Assessment & Plan Note (Signed)
Chronic issue.  Adequately controlled.  Continue lisinopril 5 mg daily.  He will return for labs at the end of March.

## 2022-02-12 NOTE — Assessment & Plan Note (Signed)
Patient notes this is significantly improved.  Discussed may take another week or so to resolve completely.  He will let us know if it worsens or if it does not resolve.

## 2022-02-12 NOTE — Assessment & Plan Note (Signed)
Chronic issue.  He can continue Tylenol 1000 mg every 8 hours as needed for pain.  Discussed we could try meloxicam or diclofenac in the future if he would like.

## 2022-02-18 NOTE — Addendum Note (Signed)
Addended by: Leone Haven on: 02/18/2022 01:02 PM   Modules accepted: Orders

## 2022-02-27 ENCOUNTER — Other Ambulatory Visit: Payer: Self-pay | Admitting: Family Medicine

## 2022-02-27 DIAGNOSIS — F411 Generalized anxiety disorder: Secondary | ICD-10-CM

## 2022-02-27 NOTE — Telephone Encounter (Signed)
Requesting: Alprazolam Contract: No UDS: No Last Visit: 02/12/2022 Next Visit: Visit date not found Last Refill: 01/31/2022  Please Advise

## 2022-03-07 ENCOUNTER — Other Ambulatory Visit: Payer: Self-pay | Admitting: Family Medicine

## 2022-03-14 ENCOUNTER — Encounter: Payer: Self-pay | Admitting: Radiology

## 2022-03-29 ENCOUNTER — Other Ambulatory Visit: Payer: Self-pay | Admitting: Family Medicine

## 2022-03-29 DIAGNOSIS — F411 Generalized anxiety disorder: Secondary | ICD-10-CM

## 2022-04-01 ENCOUNTER — Other Ambulatory Visit: Payer: Self-pay | Admitting: Family Medicine

## 2022-04-01 DIAGNOSIS — F411 Generalized anxiety disorder: Secondary | ICD-10-CM

## 2022-04-01 NOTE — Telephone Encounter (Signed)
LOV: 02/12/22  NOV: N/A

## 2022-04-12 ENCOUNTER — Other Ambulatory Visit: Payer: Self-pay | Admitting: Family Medicine

## 2022-05-03 ENCOUNTER — Other Ambulatory Visit: Payer: Self-pay | Admitting: Family Medicine

## 2022-05-03 DIAGNOSIS — F411 Generalized anxiety disorder: Secondary | ICD-10-CM

## 2022-05-05 NOTE — Telephone Encounter (Signed)
Duplicate request, see other refill request

## 2022-05-05 NOTE — Telephone Encounter (Signed)
LOV: 02/12/22  NOV: N/A 

## 2022-05-06 MED ORDER — ALPRAZOLAM 0.5 MG PO TABS: ORAL_TABLET | ORAL | 0 refills | Status: DC

## 2022-05-31 ENCOUNTER — Other Ambulatory Visit: Payer: Self-pay | Admitting: Family Medicine

## 2022-05-31 DIAGNOSIS — F411 Generalized anxiety disorder: Secondary | ICD-10-CM

## 2022-06-04 NOTE — Telephone Encounter (Signed)
LOV: 02/12/22  NOV: N/A 

## 2022-06-04 NOTE — Telephone Encounter (Signed)
Patient needs follow-up scheduled for this to be refilled.

## 2022-06-05 ENCOUNTER — Other Ambulatory Visit: Payer: Self-pay | Admitting: Family Medicine

## 2022-06-05 DIAGNOSIS — F411 Generalized anxiety disorder: Secondary | ICD-10-CM

## 2022-06-06 ENCOUNTER — Telehealth: Payer: Self-pay | Admitting: Family Medicine

## 2022-06-06 NOTE — Telephone Encounter (Signed)
Prescription Request  06/06/2022  LOV: 02/12/2022  What is the name of the medication or equipment? ALPRAZolam (XANAX) 0.5 MG tablet  Have you contacted your pharmacy to request a refill? Yes   Which pharmacy would you like this sent to?  [id number] Nicholes Rough,  - [address] AT [facility] OF [address] [place] [address] Phone: [phone] Fax: [phone]     Patient notified that their request is being sent to the clinical staff for review and that they should receive a response within 2 business days.   Please advise at Mobile [phone] (mobile)

## 2022-06-07 MED ORDER — ALPRAZOLAM 0.5 MG PO TABS: ORAL_TABLET | ORAL | 0 refills | Status: DC

## 2022-06-10 NOTE — Telephone Encounter (Signed)
I sent this in on 06/07/22.

## 2022-06-12 ENCOUNTER — Other Ambulatory Visit: Payer: Self-pay | Admitting: Family Medicine

## 2022-06-14 NOTE — Telephone Encounter (Signed)
Oka to refill tizanidine last fill 04/2022 and last 02/2022.

## 2022-07-03 ENCOUNTER — Encounter: Payer: Self-pay | Admitting: Family Medicine

## 2022-07-03 ENCOUNTER — Other Ambulatory Visit: Payer: Self-pay | Admitting: Family Medicine

## 2022-07-03 DIAGNOSIS — F411 Generalized anxiety disorder: Secondary | ICD-10-CM

## 2022-07-03 MED ORDER — ALPRAZOLAM 0.5 MG PO TABS: ORAL_TABLET | ORAL | 0 refills | Status: DC

## 2022-07-03 NOTE — Telephone Encounter (Signed)
Okay to refill?  LOV: 02/12/22  NOV: 07/08/22

## 2022-07-08 ENCOUNTER — Encounter: Payer: Self-pay | Admitting: Family Medicine

## 2022-07-08 ENCOUNTER — Ambulatory Visit (INDEPENDENT_AMBULATORY_CARE_PROVIDER_SITE_OTHER): Payer: Medicare Other | Admitting: Family Medicine

## 2022-07-08 VITALS — BP 128/82 | HR 68 | Temp 98.2°F | Ht 71.0 in | Wt 179.2 lb

## 2022-07-08 DIAGNOSIS — K219 Gastro-esophageal reflux disease without esophagitis: Secondary | ICD-10-CM | POA: Diagnosis not present

## 2022-07-08 DIAGNOSIS — F411 Generalized anxiety disorder: Secondary | ICD-10-CM

## 2022-07-08 DIAGNOSIS — I10 Essential (primary) hypertension: Secondary | ICD-10-CM

## 2022-07-08 DIAGNOSIS — E291 Testicular hypofunction: Secondary | ICD-10-CM

## 2022-07-08 DIAGNOSIS — E785 Hyperlipidemia, unspecified: Secondary | ICD-10-CM | POA: Insufficient documentation

## 2022-07-08 LAB — COMPREHENSIVE METABOLIC PANEL
ALT: 15 U/L (ref 0–53)
AST: 19 U/L (ref 0–37)
Albumin: 4.3 g/dL (ref 3.5–5.2)
Alkaline Phosphatase: 79 U/L (ref 39–117)
BUN: 16 mg/dL (ref 6–23)
CO2: 28 mEq/L (ref 19–32)
Calcium: 10 mg/dL (ref 8.4–10.5)
Chloride: 102 mEq/L (ref 96–112)
Creatinine, Ser: 1.14 mg/dL (ref 0.40–1.50)
GFR: 71.59 mL/min (ref >60.00)
Glucose, Bld: 76 mg/dL (ref 70–99)
Potassium: 4.5 mEq/L (ref 3.5–5.1)
Sodium: 137 mEq/L (ref 135–145)
Total Bilirubin: 0.4 mg/dL (ref 0.2–1.2)
Total Protein: 7.5 g/dL (ref 6.0–8.3)

## 2022-07-08 LAB — LDL CHOLESTEROL, DIRECT: Direct LDL: 136 mg/dL

## 2022-07-08 LAB — LIPID PANEL
Cholesterol: 187 mg/dL (ref 0–200)
HDL: 33.5 mg/dL — ABNORMAL LOW (ref >39.00)
NonHDL: 153.98
Total CHOL/HDL Ratio: 6
Triglycerides: 234 mg/dL — ABNORMAL HIGH (ref 0.0–149.0)
VLDL: 46.8 mg/dL — ABNORMAL HIGH (ref 0.0–40.0)

## 2022-07-08 NOTE — Assessment & Plan Note (Signed)
Chronic issue.  Adequately controlled.  He will continue omeprazole 40 mg daily.  He can continue as needed Tums.

## 2022-07-08 NOTE — Assessment & Plan Note (Signed)
Chronic issue.  Stable.  He can continue Xanax 0.5 mg 3 times daily as needed for anxiety. 

## 2022-07-08 NOTE — Progress Notes (Signed)
Gregory Alar, MD Phone: [phone]  Gregory Bame Sr. is a 57 y.o. male who presents today for f/u.  HYPERTENSION Disease Monitoring Home BP Monitoring 131-134/79-81 Chest pain- no    Dyspnea- no Medications Compliance-  taking lisinopril.  Edema- no BMET    Component Value Date/Time   NA 142 07/02/2021 0855   K 4.5 07/02/2021 0855   CL 110 07/02/2021 0855   CO2 27 07/02/2021 0855   GLUCOSE 89 07/02/2021 0855   BUN 12 07/02/2021 0855   CREATININE 1.10 07/02/2021 0855   CALCIUM 9.4 07/02/2021 0855   GFRNONAA >60 07/02/2021 0855   GFRAA >60 09/07/2017 1229   GERD:   Reflux symptoms: only if he eats peppers   Abd pain: no   Blood in stool: no  Dysphagia: no   EGD: no  Medication: taking omeprazole, rarely takes Tums  Anxiety: Patient reports this is stable.  He is taking Xanax 3 times daily as he has for many years.  No drowsiness with this.  No depression.  No SI.  Hypogonadism: He recently saw urology for follow-up.  They requested a free testosterone and H&H in 6 months.   Social History   Tobacco Use  Smoking Status Former   Types: Cigarettes   Quit date: 2010   Years since quitting: 14.5  Smokeless Tobacco Never  Tobacco Comments   smoked age [age] stopped then age 26 x 1 years max 1/2 ppd     Current Outpatient Medications on File Prior to Visit  Medication Sig Dispense Refill   albuterol (PROVENTIL HFA;VENTOLIN HFA) 108 (90 Base) MCG/ACT inhaler Inhale 1-2 puffs into the lungs every 6 (six) hours as needed for wheezing or shortness of breath. 1 Inhaler 1   ALPRAZolam (XANAX) 0.5 MG tablet TAKE 1 TABLET(0.5 MG) BY MOUTH THREE TIMES DAILY AS NEEDED FOR ANXIETY. 90 tablet 0   amoxicillin (AMOXIL) 500 MG capsule Take 4 pills one hour prior to dental work 8 capsule 2   aspirin EC 81 MG tablet Take 1 tablet (81 mg total) by mouth in the morning and at bedtime. To be taken after surgery to prevent blood clots.  Swallow whole. 84 tablet 2   Avanafil (STENDRA)  200 MG TABS TAKE 1/2 TABLET BY MOUTH AS DIRECTED     docusate sodium (COLACE) 100 MG capsule Take 1 capsule (100 mg total) by mouth daily as needed. 30 capsule 2   gabapentin (NEURONTIN) 400 MG capsule TAKE 2 CAPSULES(800 MG) BY MOUTH THREE TIMES DAILY 540 capsule 1   lisinopril (ZESTRIL) 5 MG tablet Take 1 tablet (5 mg total) by mouth daily.     Multiple Vitamin (MULTIVITAMIN) tablet Take 1 tablet by mouth daily.     omeprazole (PRILOSEC) 40 MG capsule TAKE ONE CAPSULE BY MOUTH DAILY 150 capsule 1   ondansetron (ZOFRAN) 4 MG tablet Take 1 tablet (4 mg total) by mouth every 8 (eight) hours as needed for nausea or vomiting. 40 tablet 0   testosterone cypionate (DEPOTESTOSTERONE CYPIONATE) 200 MG/ML injection Inject 160 mg into the skin every 14 (fourteen) days.     tiZANidine (ZANAFLEX) 4 MG tablet TAKE 1 TABLET(4 MG) BY MOUTH TWICE DAILY 60 tablet 0   No current facility-administered medications on file prior to visit.     ROS see history of present illness  Objective  Physical Exam Vitals:   07/08/22 0805  BP: 128/82  Pulse: 68  Temp: 98.2 F (36.8 C)  SpO2: 96%    BP Readings from  Last 3 Encounters:  07/08/22 128/82  02/12/22 118/70  10/17/21 110/79   Wt Readings from Last 3 Encounters:  07/08/22 179 lb 3.2 oz (81.3 kg)  02/12/22 178 lb 12.8 oz (81.1 kg)  12/24/21 173 lb (78.5 kg)    Physical Exam Constitutional:      General: He is not in acute distress.    Appearance: He is not diaphoretic.  Cardiovascular:     Rate and Rhythm: Normal rate and regular rhythm.     Heart sounds: Normal heart sounds.  Pulmonary:     Effort: Pulmonary effort is normal.     Breath sounds: Normal breath sounds.  Musculoskeletal:     Right lower leg: No edema.     Left lower leg: No edema.  Skin:    General: Skin is warm and dry.  Neurological:     Mental Status: He is alert.      Assessment/Plan: Please see individual problem list.  Primary hypertension Assessment &  Plan: Chronic issue.  Stable.  Continue lisinopril 5 mg daily.  Check labs.  Orders: -     Comprehensive metabolic panel -     Lipid panel  Gastroesophageal reflux disease, unspecified whether esophagitis present Assessment & Plan: Chronic issue.  Adequately controlled.  He will continue omeprazole 40 mg daily.  He can continue as needed Tums.   Hypogonadism male Assessment & Plan: Chronic issue.  He will continue to follow with urology.  We can periodically check his testosterone and CBC so that he does not have to travel to [place] to have his lab work completed.   Generalized anxiety disorder Assessment & Plan: Chronic issue.  Stable.  He can continue Xanax 0.5 mg 3 times daily as needed for anxiety.      Health Maintenance: patient will get his tetanus vaccine at the pharmacy.  Return in about 3 months (around 10/08/2022) for anxiety.   Gregory Alar, MD [facility]

## 2022-07-08 NOTE — Assessment & Plan Note (Signed)
Chronic issue.  He will continue to follow with urology.  We can periodically check his testosterone and CBC so that he does not have to travel to [place] to have his lab work completed.

## 2022-07-08 NOTE — Assessment & Plan Note (Signed)
Chronic issue.  Stable.  Continue lisinopril 5 mg daily.  Check labs.

## 2022-07-09 ENCOUNTER — Encounter: Payer: Self-pay | Admitting: Family Medicine

## 2022-07-09 DIAGNOSIS — E785 Hyperlipidemia, unspecified: Secondary | ICD-10-CM

## 2022-07-12 MED ORDER — ROSUVASTATIN CALCIUM 20 MG PO TABS
20.0000 mg | ORAL_TABLET | Freq: Every day | ORAL | 3 refills | Status: DC
Start: 2022-07-12 — End: 2023-10-09

## 2022-07-12 NOTE — Telephone Encounter (Signed)
I sent Crestor to the pharmacy.  He needs lab work scheduled for 6 weeks from now.

## 2022-07-12 NOTE — Telephone Encounter (Signed)
Pt would like to start a statin.

## 2022-07-15 NOTE — Telephone Encounter (Signed)
Patient is requesting information on the Statin you sent in for him and to know how long it takes to start working sent through my chart.

## 2022-07-23 NOTE — Telephone Encounter (Signed)
LMTCB. Needs lab appt scheduled in 6 weeks. Please schedule

## 2022-07-25 NOTE — Telephone Encounter (Signed)
Pt scheduled a lab appointment 08/28/22

## 2022-08-09 ENCOUNTER — Other Ambulatory Visit: Payer: Self-pay | Admitting: Family Medicine

## 2022-08-23 ENCOUNTER — Other Ambulatory Visit: Payer: Self-pay | Admitting: Family Medicine

## 2022-08-27 ENCOUNTER — Other Ambulatory Visit: Payer: Self-pay | Admitting: Family Medicine

## 2022-08-27 DIAGNOSIS — F411 Generalized anxiety disorder: Secondary | ICD-10-CM

## 2022-08-28 ENCOUNTER — Other Ambulatory Visit (INDEPENDENT_AMBULATORY_CARE_PROVIDER_SITE_OTHER): Payer: Medicare Other

## 2022-08-28 DIAGNOSIS — E785 Hyperlipidemia, unspecified: Secondary | ICD-10-CM | POA: Diagnosis not present

## 2022-08-28 DIAGNOSIS — Z125 Encounter for screening for malignant neoplasm of prostate: Secondary | ICD-10-CM

## 2022-08-28 LAB — LDL CHOLESTEROL, DIRECT: Direct LDL: 156 mg/dL

## 2022-08-28 LAB — HEPATIC FUNCTION PANEL
ALT: 13 U/L (ref 0–53)
AST: 17 U/L (ref 0–37)
Albumin: 4.3 g/dL (ref 3.5–5.2)
Alkaline Phosphatase: 83 U/L (ref 39–117)
Bilirubin, Direct: 0.1 mg/dL (ref 0.0–0.3)
Total Bilirubin: 0.6 mg/dL (ref 0.2–1.2)
Total Protein: 6.8 g/dL (ref 6.0–8.3)

## 2022-08-30 LAB — PSA, MEDICARE: PSA: 0.96 ng/mL (ref 0.10–4.00)

## 2022-09-02 ENCOUNTER — Telehealth: Payer: Self-pay

## 2022-09-02 NOTE — Telephone Encounter (Signed)
-----   Message from [name] sent at 08/30/2022 12:47 PM EDT ----- Please let the patient know that his LDL cholesterol has actually gone up from a month ago.  Has he been taking the Crestor?  If he has been taking that I would suggest increasing the Crestor to 40 mg daily and rechecking direct LDL and hepatic function panel in 6 weeks.

## 2022-09-02 NOTE — Telephone Encounter (Signed)
Lvm for pt to give office a call back in regards to lab results

## 2022-09-03 NOTE — Addendum Note (Signed)
Addended by: Prince Solian A on: 09/03/2022 09:07 AM   Modules accepted: Orders

## 2022-10-03 ENCOUNTER — Other Ambulatory Visit: Payer: Self-pay | Admitting: Family Medicine

## 2022-10-03 DIAGNOSIS — F411 Generalized anxiety disorder: Secondary | ICD-10-CM

## 2022-10-04 ENCOUNTER — Other Ambulatory Visit: Payer: Self-pay | Admitting: Family Medicine

## 2022-10-04 DIAGNOSIS — F411 Generalized anxiety disorder: Secondary | ICD-10-CM

## 2022-10-04 MED ORDER — ALPRAZOLAM 0.5 MG PO TABS
ORAL_TABLET | ORAL | 0 refills | Status: DC
Start: 2022-10-04 — End: 2022-11-04

## 2022-10-04 NOTE — Telephone Encounter (Signed)
Patient is due for follow-up in the next month or so.  Please get him scheduled.  Thanks.  Refill sent to pharmacy.

## 2022-10-10 ENCOUNTER — Other Ambulatory Visit: Payer: Self-pay | Admitting: Family Medicine

## 2022-10-10 NOTE — Telephone Encounter (Signed)
Sent to pharmacy. Patient needs follow-up scheduled for the next month or so.

## 2022-10-30 IMAGING — MR MR KNEE*L* W/O CM
7 series · 40 of 40 positions shown · non-contrast
Comparison: Left knee radiographs 04/19/2020

CLINICAL DATA: Chronic knee pain.  Degenerative disease on x-ray.

EXAM:
MRI OF THE LEFT KNEE WITHOUT CONTRAST
TECHNIQUE: Multiplanar, multisequence MR imaging of the knee was performed. No
intravenous contrast was administered.

[Series 8: T2 fat-sat · axial · left · 4.0mm · 0.50mm/px · z∈[-88,+36]mm · 5 of 26 slices shown (1 of 3)]
[im 1/26]
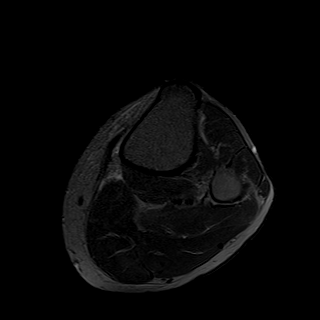
[im 7/26]
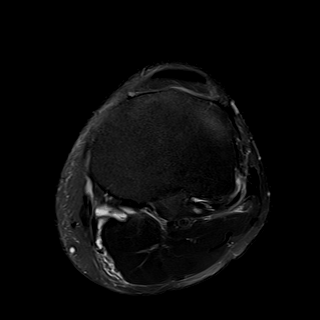
[im 13/26]
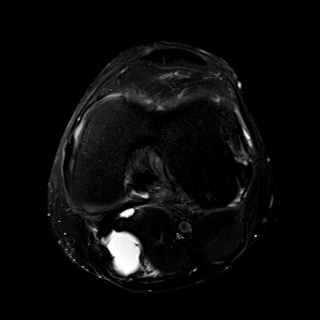
[im 19/26]
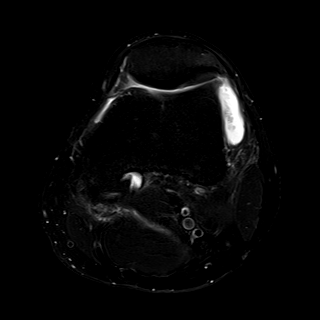
[im 26/26]
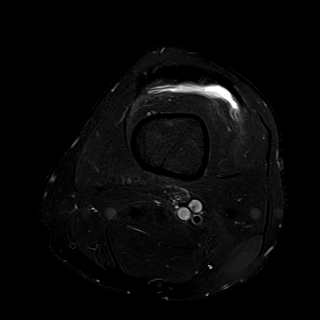

[Series 9: T2 fat-sat · coronal · left · 4.0mm · 0.59mm/px · 6 of 30 slices shown (2 of 3)]
[im 1/30]
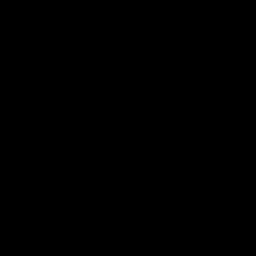
[im 6/30]
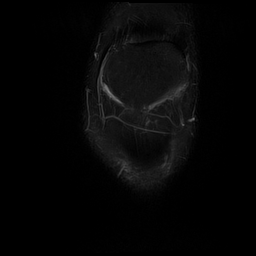
[im 12/30]
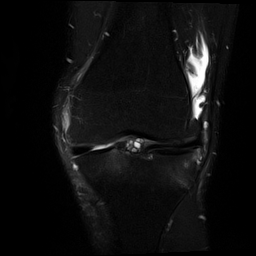
[im 18/30]
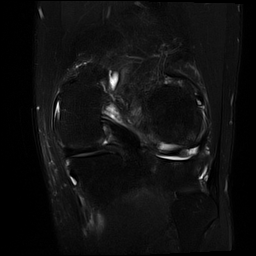
[im 24/30]
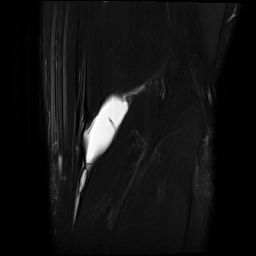
[im 30/30]
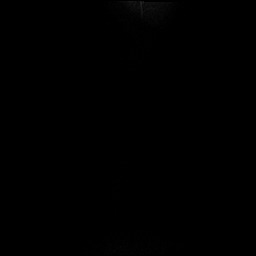

[Series 10: T1 · coronal · left · 4.0mm · 0.59mm/px · 6 of 30 slices shown]
[im 1/30]
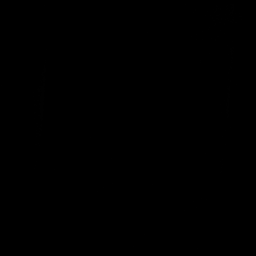
[im 6/30]
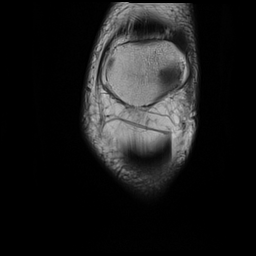
[im 12/30]
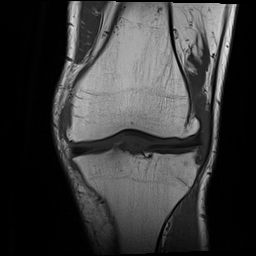
[im 18/30]
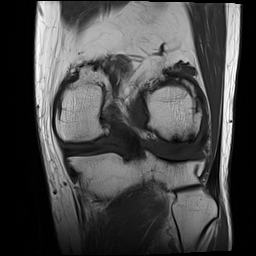
[im 24/30]
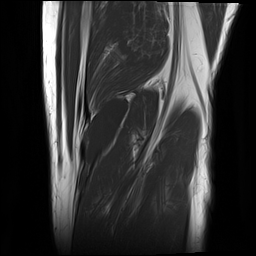
[im 30/30]
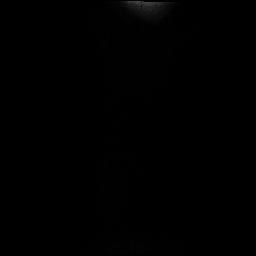

[Series 11: PD fat-sat · coronal · left · 4.0mm · 0.59mm/px · 6 of 30 slices shown (1 of 2)]
[im 1/30]
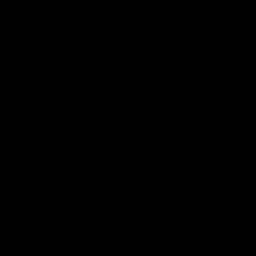
[im 6/30]
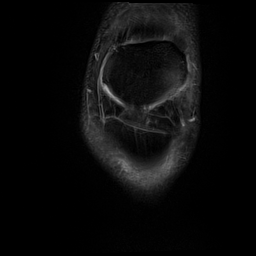
[im 12/30]
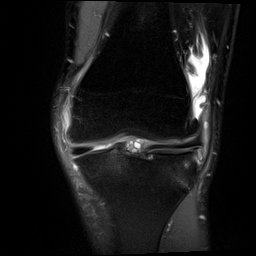
[im 18/30]
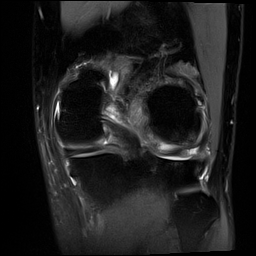
[im 24/30]
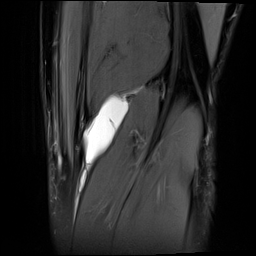
[im 30/30]
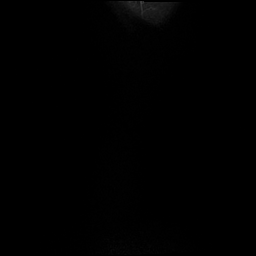

[Series 12: PD fat-sat · sagittal · left · 3.0mm · 0.59mm/px · 7 of 35 slices shown (2 of 2)]
[im 1/35]
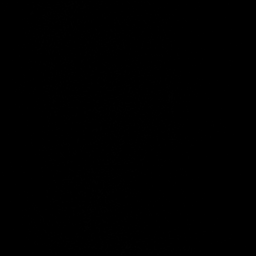
[im 6/35]
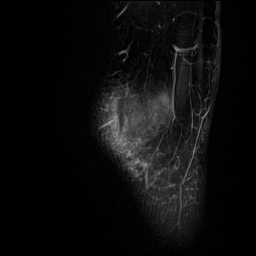
[im 12/35]
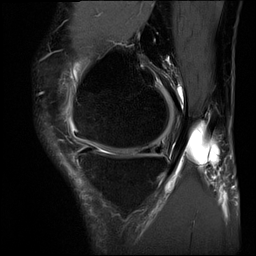
[im 18/35]
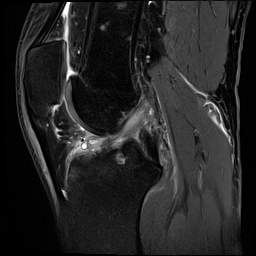
[im 23/35]
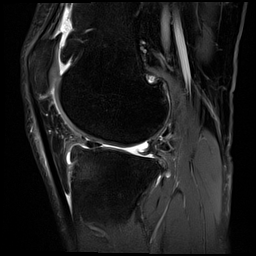
[im 29/35]
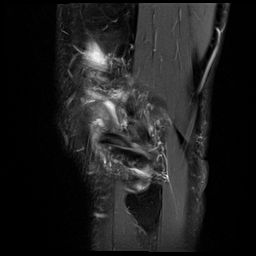
[im 35/35]
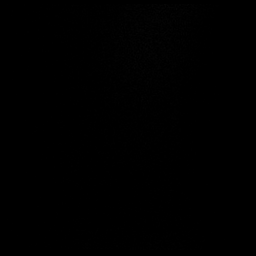

[Series 13: T2 fat-sat · sagittal · left · 3.0mm · 0.59mm/px · 7 of 35 slices shown (3 of 3)]
[im 1/35]
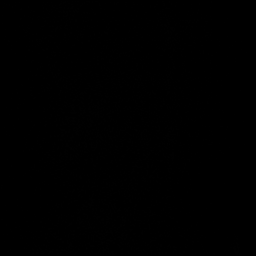
[im 6/35]
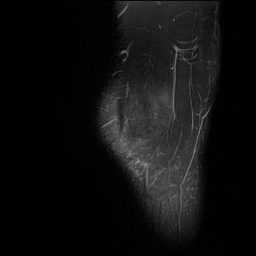
[im 12/35]
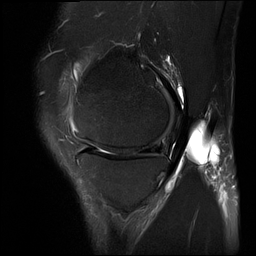
[im 18/35]
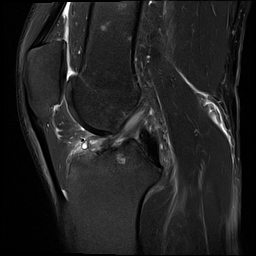
[im 23/35]
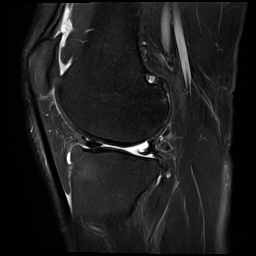
[im 29/35]
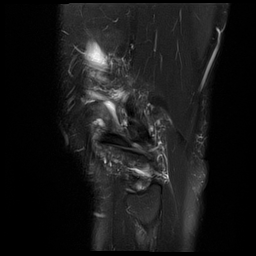
[im 35/35]
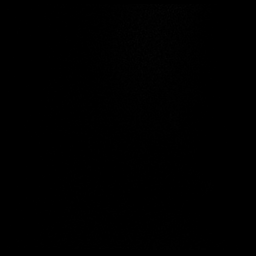

[Series 14: PD · oblique · left · 2.0mm · 0.47mm/px · 3 of 16 slices shown]
[im 1/16]
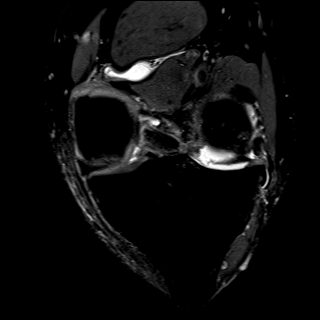
[im 8/16]
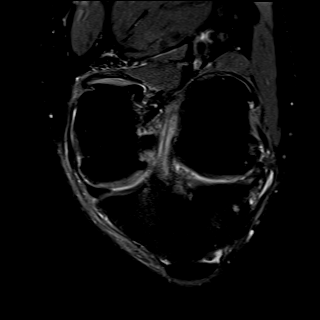
[im 16/16]
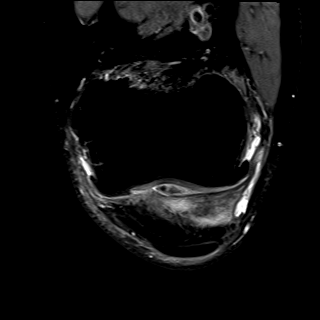

[40 of 40 positions shown; findings below may reference images not displayed]

FINDINGS: MENISCI

Medial meniscus: There is intermediate proton density signal
throughout the posterior horn of the medial meniscus indicating a
chronic appearing tear. This is moderately broad at the midsubstance
of the root of the posterior horn (sagittal series 12 images 15
through 17) coronal where it extends through the inferior articular
surface of the middle third of the meniscal triangle. This also
extends through the superior articular surface of the peripheral
third of the meniscal triangle and the inferior articular surface of
the middle third of the meniscal triangle within the more medial
aspect of the posterior horn (sagittal images 11 through 14).

Lateral meniscus: There is high-grade attenuation of the superior
aspect of the meniscal triangle of the root of the posterior horn of
the lateral meniscus (sagittal series 12 images 23 through 25), a
complex tear. There is moderate intrasubstance degeneration within
the mid AP dimension of the body of the lateral meniscus (coronal
series 11, image 16). Mild extrusion of the body of the lateral
meniscus.

LIGAMENTS

Cruciates: Mild intermediate T2 signal within the midsubstance of
the ACL fibers which remain parallel and intact, likely mucoid
degeneration.

Collaterals: The medial collateral ligament is intact. The fibular
collateral ligament, biceps femoris tendon, iliotibial band, and
popliteus tendon are intact.

CARTILAGE

Patellofemoral: Linear intermediate T2 signal chondrosis at the
inferior aspect of the lateral patellar facet (axial image 8)
without a fluid bright defect. Mild surface irregularity of the
medial and lateral trochlear cartilage.

Medial: High-grade partial to full-thickness cartilage loss within
the medial aspect of the weight-bearing medial femoral condyle and
medial tibial plateau. Mild-to-moderate thinning of the posterior
aspect of the weight-bearing medial femoral condyle with mild
subchondral marrow edema (coronal image 14).

Lateral: Moderate to high-grade thinning of the posterior
weight-bearing lateral femoral condyle and lateral tibial plateau
cartilage, predominantly full-thickness (sagittal image 25).
Additional high-grade partial or full-thickness cartilage loss
within the posterior non weight-bearing lateral femoral condyle
diffusely.

Joint: Tinyjoint effusion. Normal Hoffa's fat pad. No plical
thickening.

Popliteal Fossa: Mild-to-moderate Baker's cyst measuring up to 2.3 x
3.1 x 5.4 cm (transverse by AP by craniocaudal).

Extensor Mechanism:  Intact quadriceps tendon and patellar tendon.

Bones: Moderate marrow edema and cystic change within the anterior
medial and lateral tibial spines. Moderate to large peripheral
lateral compartment and moderate peripheral medial compartment and
patellofemoral compartment degenerative osteophytes.

Other: None.
IMPRESSION: :
IMPRESSION: 1. Moderate to severe tricompartmental osteoarthritis.
2. Chronic appearing diffuse undersurface tear within the posterior
horn of the medial meniscus.
3. Complex tear of the superior articular surface of the root of the
posterior horn of the lateral meniscus.
4. Mild mucoid degeneration of the ACL.
5. Tiny joint effusion.  Mild-to-moderate Baker's cyst.

## 2022-11-04 ENCOUNTER — Other Ambulatory Visit: Payer: Self-pay | Admitting: Family Medicine

## 2022-11-04 DIAGNOSIS — F411 Generalized anxiety disorder: Secondary | ICD-10-CM

## 2022-11-04 MED ORDER — ALPRAZOLAM 0.5 MG PO TABS
ORAL_TABLET | ORAL | 0 refills | Status: DC
Start: 2022-11-04 — End: 2022-11-27

## 2022-11-04 NOTE — Telephone Encounter (Signed)
Please call the patient. He needs to be scheduled for a follow-up for as soon as possible for me to continue to refill this prescription. He is over due for follow-up. I will send in one months worth though he needs to have follow-up scheduled for me to fill this again.

## 2022-11-07 ENCOUNTER — Other Ambulatory Visit (INDEPENDENT_AMBULATORY_CARE_PROVIDER_SITE_OTHER): Payer: Medicare Other

## 2022-11-07 DIAGNOSIS — E785 Hyperlipidemia, unspecified: Secondary | ICD-10-CM | POA: Diagnosis not present

## 2022-11-07 LAB — LDL CHOLESTEROL, DIRECT: Direct LDL: 136 mg/dL

## 2022-11-07 LAB — HEPATIC FUNCTION PANEL
ALT: 15 U/L (ref 0–53)
AST: 18 U/L (ref 0–37)
Albumin: 4.3 g/dL (ref 3.5–5.2)
Alkaline Phosphatase: 83 U/L (ref 39–117)
Bilirubin, Direct: 0.1 mg/dL (ref 0.0–0.3)
Total Bilirubin: 0.6 mg/dL (ref 0.2–1.2)
Total Protein: 7.3 g/dL (ref 6.0–8.3)

## 2022-11-25 ENCOUNTER — Encounter: Payer: Self-pay | Admitting: Family Medicine

## 2022-11-25 MED ORDER — OMEPRAZOLE 40 MG PO CPDR: 40.0000 mg | DELAYED_RELEASE_CAPSULE | Freq: Every day | ORAL | 1 refills | Status: DC

## 2022-11-27 ENCOUNTER — Encounter: Payer: Self-pay | Admitting: Family Medicine

## 2022-11-27 ENCOUNTER — Ambulatory Visit: Payer: Medicare Other | Admitting: Family Medicine

## 2022-11-27 VITALS — BP 118/78 | HR 72 | Temp 98.0°F | Ht 71.0 in | Wt 170.2 lb

## 2022-11-27 DIAGNOSIS — F411 Generalized anxiety disorder: Secondary | ICD-10-CM | POA: Diagnosis not present

## 2022-11-27 DIAGNOSIS — E782 Mixed hyperlipidemia: Secondary | ICD-10-CM

## 2022-11-27 MED ORDER — ALPRAZOLAM 0.5 MG PO TABS: ORAL_TABLET | ORAL | 0 refills | Status: DC

## 2022-11-27 NOTE — Assessment & Plan Note (Addendum)
Chronic issue.  Discussed his LDL was not at goal.  Discussed goal of less than 70 for his LDL.  I advised to start on statin.  Patient will consider this and let us know if he would like to proceed with this.  Discussed it may be reasonable to have his new provider recheck his cholesterol in a few months.

## 2022-11-27 NOTE — Progress Notes (Signed)
[name], MD Phone: [phone]  Gregory Bame Sr. is a 57 y.o. male who presents today for f/u.  Anxiety: Patient notes this has been flaring up recently given the situation in society.  He notes no depression or SI.  Xanax has been helpful.  He is taking this 3 times daily.  Hyperlipidemia: Patient is not taking Crestor.  He notes no chest pain, claudication, right upper quadrant pain, or myalgias.  He has changed his diet and has dropped about 10 pounds.  The 10-year ASCVD risk score (Arnett DK, et al., 2019) is: 8.7%   Values used to calculate the score:     Age: 16 years     Sex: Male     Is Non-Hispanic African American: No     Diabetic: No     Tobacco smoker: No     Systolic Blood Pressure: 118 mmHg     Is BP treated: Yes     HDL Cholesterol: 33.5 mg/dL     Total Cholesterol: 187 mg/dL   Social History   Tobacco Use  Smoking Status Former   Current packs/day: 0.00   Types: Cigarettes   Quit date: 2010   Years since quitting: 14.8  Smokeless Tobacco Never  Tobacco Comments   smoked age [age] stopped then age 66 x 1 years max 1/2 ppd     Current Outpatient Medications on File Prior to Visit  Medication Sig Dispense Refill   albuterol (PROVENTIL HFA;VENTOLIN HFA) 108 (90 Base) MCG/ACT inhaler Inhale 1-2 puffs into the lungs every 6 (six) hours as needed for wheezing or shortness of breath. 1 Inhaler 1   amoxicillin (AMOXIL) 500 MG capsule Take 4 pills one hour prior to dental work 8 capsule 2   Avanafil (STENDRA) 200 MG TABS TAKE 1/2 TABLET BY MOUTH AS DIRECTED     gabapentin (NEURONTIN) 400 MG capsule TAKE 2 CAPSULES(800 MG) BY MOUTH THREE TIMES DAILY 540 capsule 1   lisinopril (ZESTRIL) 5 MG tablet Take 1 tablet (5 mg total) by mouth daily.     Multiple Vitamin (MULTIVITAMIN) tablet Take 1 tablet by mouth daily.     omeprazole (PRILOSEC) 40 MG capsule Take 1 capsule (40 mg total) by mouth daily. 150 capsule 1   ondansetron (ZOFRAN) 4 MG tablet Take 1 tablet  (4 mg total) by mouth every 8 (eight) hours as needed for nausea or vomiting. 40 tablet 0   rosuvastatin (CRESTOR) 20 MG tablet Take 1 tablet (20 mg total) by mouth daily. 90 tablet 3   testosterone cypionate (DEPOTESTOSTERONE CYPIONATE) 200 MG/ML injection Inject 160 mg into the skin every 14 (fourteen) days.     tiZANidine (ZANAFLEX) 4 MG tablet Take 1 tablet (4 mg total) by mouth 2 (two) times daily as needed for muscle spasms. 60 tablet 0   No current facility-administered medications on file prior to visit.     ROS see history of present illness  Objective  Physical Exam Vitals:   11/27/22 1035  BP: 118/78  Pulse: 72  Temp: 98 F (36.7 C)  SpO2: 96%    BP Readings from Last 3 Encounters:  11/27/22 118/78  07/08/22 128/82  02/12/22 118/70   Wt Readings from Last 3 Encounters:  11/27/22 170 lb 3.2 oz (77.2 kg)  07/08/22 179 lb 3.2 oz (81.3 kg)  02/12/22 178 lb 12.8 oz (81.1 kg)    Physical Exam Constitutional:      General: He is not in acute distress.    Appearance: He is  not diaphoretic.  Cardiovascular:     Rate and Rhythm: Normal rate and regular rhythm.     Heart sounds: Normal heart sounds.  Pulmonary:     Effort: Pulmonary effort is normal.     Breath sounds: Normal breath sounds.  Skin:    General: Skin is warm and dry.  Neurological:     Mental Status: He is alert.      Assessment/Plan: Please see individual problem list.  Mixed hyperlipidemia Assessment & Plan: Chronic issue.  Discussed his LDL was not at goal.  Discussed goal of less than 70 for his LDL.  I advised to start on statin.  Patient will consider this and let us know if he would like to proceed with this.  Discussed it may be reasonable to have his new provider recheck his cholesterol in a few months.   Generalized anxiety disorder Assessment & Plan: Chronic issue.  Slightly worsened recently.  He will continue Xanax 0.5 mg 3 times daily as needed for anxiety.  Patient has been on  Xanax for quite some time and generally has had adequate management of his anxiety on this medication.  Consider SSRI if symptoms are worsening.  Discussed that he would be transferring care to another provider here.  Discussed that not all providers practice medicine in the same way and they potentially may not be willing to continue prescribing his Xanax long-term given long-term risk.  Patient noted he wanted to go ahead and get scheduled with a new provider so that he knows whether or not his Xanax would be refilled prior to me leaving the office.  Orders: -     ALPRAZolam; TAKE 1 TABLET(0.5 MG) BY MOUTH THREE TIMES DAILY AS NEEDED FOR ANXIETY.  Dispense: 90 tablet; Refill: 0    Return in about 3 months (around 02/27/2023) for In person for anxiety transfer of care.   [name], MD [facility]

## 2022-11-27 NOTE — Assessment & Plan Note (Addendum)
Chronic issue.  Slightly worsened recently.  He will continue Xanax 0.5 mg 3 times daily as needed for anxiety.  Patient has been on Xanax for quite some time and generally has had adequate management of his anxiety on this medication.  Consider SSRI if symptoms are worsening.  Discussed that he would be transferring care to another provider here.  Discussed that not all providers practice medicine in the same way and they potentially may not be willing to continue prescribing his Xanax long-term given long-term risk.  Patient noted he wanted to go ahead and get scheduled with a new provider so that he knows whether or not his Xanax would be refilled prior to me leaving the office.

## 2022-11-27 NOTE — Patient Instructions (Addendum)
Nice to see you. I would encourage you to take a statin.  Your goal LDL cholesterol is less than 70. If you would like to start the Crestor please let us know and we can resend this to the pharmacy.  You would need lab work 6 weeks after starting this.

## 2022-12-05 ENCOUNTER — Other Ambulatory Visit: Payer: Self-pay | Admitting: Family Medicine

## 2022-12-30 ENCOUNTER — Encounter: Payer: Self-pay | Admitting: Family Medicine

## 2022-12-31 NOTE — Telephone Encounter (Signed)
Spoke to pt and explained to him that his first appt is [facility] with Rene Kocher and then the 2nd appt is with  Birdie Sons

## 2023-01-03 ENCOUNTER — Other Ambulatory Visit: Payer: Self-pay | Admitting: Family Medicine

## 2023-01-03 DIAGNOSIS — F411 Generalized anxiety disorder: Secondary | ICD-10-CM

## 2023-01-17 ENCOUNTER — Ambulatory Visit (INDEPENDENT_AMBULATORY_CARE_PROVIDER_SITE_OTHER): Payer: Medicare Other | Admitting: *Deleted

## 2023-01-17 ENCOUNTER — Ambulatory Visit: Payer: Medicare Other | Admitting: Family Medicine

## 2023-01-17 VITALS — Ht 71.0 in | Wt 175.0 lb

## 2023-01-17 DIAGNOSIS — I10 Essential (primary) hypertension: Secondary | ICD-10-CM

## 2023-01-17 DIAGNOSIS — Z0000 Encounter for general adult medical examination without abnormal findings: Secondary | ICD-10-CM

## 2023-01-17 NOTE — Patient Instructions (Signed)
 [name] , Thank you for taking time to come for your Medicare Wellness Visit. I appreciate your ongoing commitment to your health goals. Please review the following plan we discussed and let me know if I can assist you in the future.   Referrals/Orders/Follow-Ups/Clinician Recommendations: Consider updating your vaccines.   This is a list of the screening recommended for you and due dates:  Health Maintenance  Topic Date Due   Zoster (Shingles) Vaccine (1 of 2) Never done   DTaP/Tdap/Td vaccine (2 - Td or Tdap) 09/24/2019   Flu Shot  04/07/2023*   Medicare Annual Wellness Visit  01/17/2024   Colon Cancer Screening  01/25/2024   Hepatitis C Screening  Completed   HIV Screening  Completed   HPV Vaccine  Aged Out   COVID-19 Vaccine  Discontinued  *Topic was postponed. The date shown is not the original due date.    Advanced directives: (Declined) Advance directive discussed with you today. Even though you declined this today, please call our office should you change your mind, and we can give you the proper paperwork for you to fill out.  Next Medicare Annual Wellness Visit scheduled for next year: Yes 01/19/24 @ 9:30

## 2023-01-17 NOTE — Progress Notes (Signed)
 Subjective:   Gregory DELENA Seltzer Sr. is a 58 y.o. male who presents for Medicare Annual/Subsequent preventive examination.  Visit Complete: Virtual I connected with  Gregory DELENA Seltzer Sr. on 01/17/23 by a audio enabled telemedicine application and verified that I am speaking with the correct person using two identifiers. Interactive audio and video telecommunications were attempted between this provider and patient, however failed, due to patient having technical difficulties OR patient did not have access to video capability.  We continued and completed visit with audio only.    Patient Location: Home  Provider Location: Home Office  I discussed the limitations of evaluation and management by telemedicine. The patient expressed understanding and agreed to proceed.  Vital Signs: Because this visit was a virtual/telehealth visit, some criteria may be missing or patient reported. Any vitals not documented were not able to be obtained and vitals that have been documented are patient reported.   Cardiac Risk Factors include: advanced age (>47men, >71 women);hypertension;male gender;dyslipidemia     Objective:    Today's Vitals   01/17/23 0814 01/17/23 0815  Weight: 175 lb (79.4 kg)   Height: 5' 11 (1.803 m)   PainSc:  4    Body mass index is 24.41 kg/m.     01/17/2023    8:32 AM 12/24/2021    8:47 AM 07/09/2021   12:19 PM 07/02/2021    8:26 AM 01/24/2021    9:18 AM 12/22/2020    2:20 PM 12/22/2019    2:24 PM  Advanced Directives  Does Patient Have a Medical Advance Directive? No No No No No No No  Would patient like information on creating a medical advance directive? No - Patient declined No - Patient declined No - Patient declined No - Patient declined  No - Patient declined No - Patient declined    Current Medications (verified) Outpatient Encounter Medications as of 01/17/2023  Medication Sig   albuterol  (PROVENTIL  HFA;VENTOLIN  HFA) 108 (90 Base) MCG/ACT inhaler Inhale 1-2 puffs  into the lungs every 6 (six) hours as needed for wheezing or shortness of breath.   ALPRAZolam  (XANAX ) 0.5 MG tablet TAKE 1 TABLET(0.5 MG) BY MOUTH THREE TIMES DAILY AS NEEDED FOR ANXIETY   amoxicillin  (AMOXIL ) 500 MG capsule Take 4 pills one hour prior to dental work   Avanafil (STENDRA) 200 MG TABS TAKE 1/2 TABLET BY MOUTH AS DIRECTED   gabapentin  (NEURONTIN ) 400 MG capsule TAKE 2 CAPSULES(800 MG) BY MOUTH THREE TIMES DAILY   lisinopril  (ZESTRIL ) 5 MG tablet Take 1 tablet (5 mg total) by mouth daily.   Multiple Vitamin (MULTIVITAMIN) tablet Take 1 tablet by mouth daily.   omeprazole  (PRILOSEC) 40 MG capsule Take 1 capsule (40 mg total) by mouth daily.   ondansetron  (ZOFRAN ) 4 MG tablet Take 1 tablet (4 mg total) by mouth every 8 (eight) hours as needed for nausea or vomiting.   rosuvastatin  (CRESTOR ) 20 MG tablet Take 1 tablet (20 mg total) by mouth daily.   testosterone  cypionate (DEPOTESTOSTERONE CYPIONATE) 200 MG/ML injection Inject 160 mg into the skin every 14 (fourteen) days.   tiZANidine  (ZANAFLEX ) 4 MG tablet TAKE 1 TABLET(4 MG) BY MOUTH TWICE DAILY AS NEEDED FOR MUSCLE SPASMS   No facility-administered encounter medications on file as of 01/17/2023.    Allergies (verified) Tessalon  perles [benzonatate ]   History: Past Medical History:  Diagnosis Date   Anxiety    Bronchitis    GERD (gastroesophageal reflux disease)    Hypertension    Insomnia  Low testosterone     Osteoarthritis    Status post total knee replacement 07/09/2021   Past Surgical History:  Procedure Laterality Date   COLONOSCOPY WITH PROPOFOL  N/A 01/24/2021   Procedure: COLONOSCOPY WITH PROPOFOL ;  Surgeon: Unk Corinn Skiff, MD;  Location: [facility] ENDOSCOPY;  Service: Gastroenterology;  Laterality: N/A;   NASAL SINUS SURGERY     Dr. Gretta   TOTAL KNEE ARTHROPLASTY Left 07/09/2021   Procedure: LEFT TOTAL KNEE ARTHROPLASTY;  Surgeon: Jerri Kay HERO, MD;  Location: [facility] OR;  Service: Orthopedics;  Laterality:  Left;   Unremarkable     WRIST SURGERY Right    Family History  Problem Relation Age of Onset   Lung cancer Father        deceased   Parkinson's disease Mother    Social History   Socioeconomic History   Marital status: Married    Spouse name: Not on file   Number of children: 1   Years of education: Not on file   Highest education level: Not on file  Occupational History   Not on file  Tobacco Use   Smoking status: Former    Current packs/day: 0.00    Types: Cigarettes    Quit date: 2010    Years since quitting: 15.0   Smokeless tobacco: Never   Tobacco comments:    smoked age [age] stopped then age 71 x 1 years max 1/2 ppd   Vaping Use   Vaping status: Never Used  Substance and Sexual Activity   Alcohol use: No    Alcohol/week: 0.0 standard drinks of alcohol   Drug use: No   Sexual activity: Yes  Other Topics Concern   Not on file  Social History Narrative   Lives in [place] with wife      Work - Licensed Conveyancer   Social Drivers of Health   Financial Resource Strain: Low Risk  (01/17/2023)   Overall Financial Resource Strain (CARDIA)    Difficulty of Paying Living Expenses: Not hard at all  Food Insecurity: No Food Insecurity (01/17/2023)   Hunger Vital Sign    Worried About Running Out of Food in the Last Year: Never true    Ran Out of Food in the Last Year: Never true  Transportation Needs: No Transportation Needs (01/17/2023)   PRAPARE - Administrator, Civil Service (Medical): No    Lack of Transportation (Non-Medical): No  Physical Activity: Inactive (01/17/2023)   Exercise Vital Sign    Days of Exercise per Week: 0 days    Minutes of Exercise per Session: 0 min  Stress: Stress Concern Present (01/17/2023)   [employer] of Occupational Health - Occupational Stress Questionnaire    Feeling of Stress : To some extent  Social Connections: Moderately Isolated (01/17/2023)   Social Connection and Isolation Panel [NHANES]    Frequency of  Communication with Friends and Family: More than three times a week    Frequency of Social Gatherings with Friends and Family: More than three times a week    Attends Religious Services: Never    Database Administrator or Organizations: No    Attends Engineer, Structural: Never    Marital Status: Married    Tobacco Counseling Counseling given: Not Answered Tobacco comments: smoked age [age] stopped then age 21 x 1 years max 1/2 ppd    Clinical Intake:  Pre-visit preparation completed: Yes  Pain : 0-10 Pain Score: 4  Pain Type: Chronic pain Pain Location: Back Pain  Orientation: Mid, Lower Pain Descriptors / Indicators: Sharp Pain Onset: More than a month ago Pain Frequency: Constant     BMI - recorded: 24.41 Nutritional Status: BMI of 19-24  Normal Nutritional Risks: None Diabetes: No  How often do you need to have someone help you when you read instructions, pamphlets, or other written materials from your doctor or pharmacy?: 1 - Never  Interpreter Needed?: No  Information entered by :: R. Fawne Hughley LPN   Activities of Daily Living    01/17/2023    8:18 AM  In your present state of health, do you have any difficulty performing the following activities:  Hearing? 0  Vision? 0  Comment glasses  Difficulty concentrating or making decisions? 0  Walking or climbing stairs? 0  Dressing or bathing? 0  Doing errands, shopping? 0  Preparing Food and eating ? N  Using the Toilet? N  In the past six months, have you accidently leaked urine? N  Do you have problems with loss of bowel control? N  Managing your Medications? N  Managing your Finances? N  Housekeeping or managing your Housekeeping? N    Patient Care Team: Maribeth Camellia MATSU, MD as PCP - General (Family Medicine)  Indicate any recent Medical Services you may have received from other than [facility] providers in the past year (date may be approximate).     Assessment:   This is a routine wellness  examination for Refoel.  Hearing/Vision screen Hearing Screening - Comments:: No issues Vision Screening - Comments:: glasses   Goals Addressed             This Visit's Progress    Patient Stated       Wants to consider more research on his back problem and improve quality of life       Depression Screen    01/17/2023    8:27 AM 11/27/2022   10:43 AM 07/08/2022    8:07 AM 12/24/2021    8:48 AM 10/17/2021    8:41 AM 07/11/2021    8:02 AM 04/06/2021    9:16 AM  PHQ 2/9 Scores  PHQ - 2 Score 0 0 0 0 0 0 0  PHQ- 9 Score 0 0 0        Fall Risk    01/17/2023    8:20 AM 11/27/2022   10:43 AM 07/08/2022    8:07 AM 02/12/2022    8:08 AM 12/24/2021    8:48 AM  Fall Risk   Falls in the past year? 1 0 0 0 0  Number falls in past yr: 0 0 0 0 0  Injury with Fall? 0 0 0 0 0  Risk for fall due to : History of fall(s);Other (Comment) No Fall Risks No Fall Risks No Fall Risks Impaired balance/gait  Risk for fall due to: Comment passed out 8 months ago      Follow up Falls evaluation completed;Falls prevention discussed Falls evaluation completed Falls evaluation completed Falls evaluation completed Falls evaluation completed;Falls prevention discussed    MEDICARE RISK AT HOME: Medicare Risk at Home Any stairs in or around the home?: Yes If so, are there any without handrails?: No Home free of loose throw rugs in walkways, pet beds, electrical cords, etc?: Yes Adequate lighting in your home to reduce risk of falls?: Yes Life alert?: No Use of a cane, walker or w/c?: Yes Grab bars in the bathroom?: No Shower chair or bench in shower?: Yes Elevated toilet seat or a handicapped toilet?:  No   Cognitive Function:        01/17/2023    8:33 AM 12/24/2021    9:05 AM 12/22/2020    2:46 PM  6CIT Screen  What Year? 0 points 0 points 0 points  What month? 0 points 0 points 0 points  What time? 0 points 0 points 0 points  Count back from 20 0 points 0 points   Months in reverse 2  points 4 points   Repeat phrase 0 points 0 points   Total Score 2 points 4 points     Immunizations Immunization History  Administered Date(s) Administered   Influenza,inj,Quad PF,6+ Mos 01/30/2015   Tdap 09/23/2009    TDAP status: Due, Education has been provided regarding the importance of this vaccine. Advised may receive this vaccine at local pharmacy or Health Dept. Aware to provide a copy of the vaccination record if obtained from local pharmacy or Health Dept. Verbalized acceptance and understanding.  Flu Vaccine status: Declined, Education has been provided regarding the importance of this vaccine but patient still declined. Advised may receive this vaccine at local pharmacy or Health Dept. Aware to provide a copy of the vaccination record if obtained from local pharmacy or Health Dept. Verbalized acceptance and understanding.  Pneumococcal vaccine status: Declined,  Education has been provided regarding the importance of this vaccine but patient still declined. Advised may receive this vaccine at local pharmacy or Health Dept. Aware to provide a copy of the vaccination record if obtained from local pharmacy or Health Dept. Verbalized acceptance and understanding.   Covid-19 vaccine status: Declined, Education has been provided regarding the importance of this vaccine but patient still declined. Advised may receive this vaccine at local pharmacy or Health Dept.or vaccine clinic. Aware to provide a copy of the vaccination record if obtained from local pharmacy or Health Dept. Verbalized acceptance and understanding.  Qualifies for Shingles Vaccine? Yes   Zostavax completed No   Shingrix Completed?: No.    Education has been provided regarding the importance of this vaccine. Patient has been advised to call insurance company to determine out of pocket expense if they have not yet received this vaccine. Advised may also receive vaccine at local pharmacy or Health Dept. Verbalized  acceptance and understanding.  Screening Tests Health Maintenance  Topic Date Due   Zoster Vaccines- Shingrix (1 of 2) Never done   DTaP/Tdap/Td (2 - Td or Tdap) 09/24/2019   Medicare Annual Wellness (AWV)  12/25/2022   INFLUENZA VACCINE  04/07/2023 (Originally 08/08/2022)   Colonoscopy  01/25/2024   Hepatitis C Screening  Completed   HIV Screening  Completed   HPV VACCINES  Aged Out   COVID-19 Vaccine  Discontinued    Health Maintenance  Health Maintenance Due  Topic Date Due   Zoster Vaccines- Shingrix (1 of 2) Never done   DTaP/Tdap/Td (2 - Td or Tdap) 09/24/2019   Medicare Annual Wellness (AWV)  12/25/2022    Colorectal cancer screening: Type of screening: Colonoscopy. Completed 01/2021. Repeat every 3 years  Lung Cancer Screening: (Low Dose CT Chest recommended if Age 73-80 years, 20 pack-year currently smoking OR have quit w/in 15years.) does not qualify.     Additional Screening:  Hepatitis C Screening: does qualify; Completed 10/2021  Vision Screening: Recommended annual ophthalmology exams for early detection of glaucoma and other disorders of the eye. Is the patient up to date with their annual eye exam?  Yes  Who is the provider or what is the name of  the office in which the patient attends annual eye exams? [facility] If pt is not established with a provider, would they like to be referred to a provider to establish care? No .   Dental Screening: Recommended annual dental exams for proper oral hygiene    Community Resource Referral / Chronic Care Management: CRR required this visit?  No   CCM required this visit?  No     Plan:     I have personally reviewed and noted the following in the patient's chart:   Medical and social history Use of alcohol, tobacco or illicit drugs  Current medications and supplements including opioid prescriptions. Patient is not currently taking opioid prescriptions. Functional ability and status Nutritional  status Physical activity Advanced directives List of other physicians Hospitalizations, surgeries, and ER visits in previous 12 months Vitals Screenings to include cognitive, depression, and falls Referrals and appointments  In addition, I have reviewed and discussed with patient certain preventive protocols, quality metrics, and best practice recommendations. A written personalized care plan for preventive services as well as general preventive health recommendations were provided to patient.     Angeline Fredericks, LPN   8/89/7974   After Visit Summary: (MyChart) Due to this being a telephonic visit, the after visit summary with patients personalized plan was offered to patient via MyChart   Nurse Notes: None

## 2023-01-31 ENCOUNTER — Other Ambulatory Visit: Payer: Self-pay | Admitting: Family Medicine

## 2023-01-31 DIAGNOSIS — F411 Generalized anxiety disorder: Secondary | ICD-10-CM

## 2023-02-04 ENCOUNTER — Ambulatory Visit (INDEPENDENT_AMBULATORY_CARE_PROVIDER_SITE_OTHER): Payer: Medicare Other | Admitting: Nurse Practitioner

## 2023-02-04 ENCOUNTER — Other Ambulatory Visit: Payer: Self-pay | Admitting: Family Medicine

## 2023-02-04 ENCOUNTER — Encounter: Payer: Self-pay | Admitting: Nurse Practitioner

## 2023-02-04 VITALS — BP 138/88 | HR 72 | Temp 98.2°F | Ht 71.0 in | Wt 173.6 lb

## 2023-02-04 DIAGNOSIS — F411 Generalized anxiety disorder: Secondary | ICD-10-CM | POA: Diagnosis not present

## 2023-02-04 DIAGNOSIS — M5441 Lumbago with sciatica, right side: Secondary | ICD-10-CM

## 2023-02-04 DIAGNOSIS — I10 Essential (primary) hypertension: Secondary | ICD-10-CM

## 2023-02-04 DIAGNOSIS — E291 Testicular hypofunction: Secondary | ICD-10-CM | POA: Diagnosis not present

## 2023-02-04 DIAGNOSIS — E782 Mixed hyperlipidemia: Secondary | ICD-10-CM | POA: Diagnosis not present

## 2023-02-04 DIAGNOSIS — G8929 Other chronic pain: Secondary | ICD-10-CM

## 2023-02-04 LAB — CBC WITH DIFFERENTIAL/PLATELET
Basophils Absolute: 0.1 10*3/uL (ref 0.0–0.1)
Basophils Relative: 0.9 % (ref 0.0–3.0)
Eosinophils Absolute: 0.4 10*3/uL (ref 0.0–0.7)
Eosinophils Relative: 3.4 % (ref 0.0–5.0)
HCT: 51 % (ref 39.0–52.0)
Hemoglobin: 17.3 g/dL — ABNORMAL HIGH (ref 13.0–17.0)
Lymphocytes Relative: 21 % (ref 12.0–46.0)
Lymphs Abs: 2.6 10*3/uL (ref 0.7–4.0)
MCHC: 33.9 g/dL (ref 30.0–36.0)
MCV: 91.4 fL (ref 78.0–100.0)
Monocytes Absolute: 0.7 10*3/uL (ref 0.1–1.0)
Monocytes Relative: 6 % (ref 3.0–12.0)
Neutro Abs: 8.4 10*3/uL — ABNORMAL HIGH (ref 1.4–7.7)
Neutrophils Relative %: 68.7 % (ref 43.0–77.0)
Platelets: 281 10*3/uL (ref 150.0–400.0)
RBC: 5.58 Mil/uL (ref 4.22–5.81)
RDW: 13.6 % (ref 11.5–15.5)
WBC: 12.2 10*3/uL — ABNORMAL HIGH (ref 4.0–10.5)

## 2023-02-04 LAB — TESTOSTERONE: Testosterone: 1030.81 ng/dL — ABNORMAL HIGH (ref 300.00–890.00)

## 2023-02-04 NOTE — Assessment & Plan Note (Signed)
Chronic. Condition is stable on Crestor 20mg  daily with no symptoms of abdominal pain or muscle ache. Continue Crestor daily.

## 2023-02-04 NOTE — Assessment & Plan Note (Signed)
Chronic. Stable on injections with no new concerns. Managed by Urology. Continue current regimen. Lab work as outlined, will fax results to his Urologist. Follow up as scheduled.

## 2023-02-04 NOTE — Assessment & Plan Note (Addendum)
Chronic. History of scoliosis and arthritis with no acute exacerbation. May consider specialist referral in the future. Continue current management.

## 2023-02-04 NOTE — Assessment & Plan Note (Signed)
Chronic. Blood pressure is stable on Lisinopril 5mg  daily with home readings around 127/79. Slightly elevated reading today in office. No symptoms of chest pain, shortness of breath, dizziness, or swelling. Continue Lisinopril daily.

## 2023-02-04 NOTE — Progress Notes (Signed)
Bethanie Dicker, NP-C Phone: [phone]  Gregory Bame Sr. is a 58 y.o. male who presents today for transfer of care.   Discussed the use of AI scribe software for clinical note transcription with the patient, who gave verbal consent to proceed.  History of Present Illness   He has hypertension, managed with lisinopril, and monitors his blood pressure at home, which typically reads around 127/79 mmHg. No chest pain, shortness of breath, dizziness, or swelling.  He is on Crestor for cholesterol management and denies any abdominal pain or muscle aches. He is due for routine blood work for urology, including hemoglobin, hematocrit, and total testosterone levels. He has had a PSA test earlier this year and typically undergoes this test once a year.  He has a history of knee replacement and scoliosis, with progressive arthritis. His back pain is worsening, but he is currently managing it. He takes gabapentin in the morning, and uses a muscle relaxer as needed for spasms.   He has a history of anxiety, which he describes as manageable. He has been on alprazolam 0.5 mg three times a day since his early twenties and finds it effective. He recently refilled his prescription.      Social History   Tobacco Use  Smoking Status Former   Current packs/day: 0.00   Types: Cigarettes   Quit date: 2010   Years since quitting: 15.0  Smokeless Tobacco Never  Tobacco Comments   smoked age [age] stopped then age 55 x 1 years max 1/2 ppd     Current Outpatient Medications on File Prior to Visit  Medication Sig Dispense Refill   albuterol (PROVENTIL HFA;VENTOLIN HFA) 108 (90 Base) MCG/ACT inhaler Inhale 1-2 puffs into the lungs every 6 (six) hours as needed for wheezing or shortness of breath. 1 Inhaler 1   ALPRAZolam (XANAX) 0.5 MG tablet TAKE 1 TABLET(0.5 MG) BY MOUTH THREE TIMES DAILY AS NEEDED FOR ANXIETY 90 tablet 0   Avanafil (STENDRA) 200 MG TABS TAKE 1/2 TABLET BY MOUTH AS DIRECTED     gabapentin  (NEURONTIN) 400 MG capsule TAKE 2 CAPSULES(800 MG) BY MOUTH THREE TIMES DAILY 540 capsule 1   lisinopril (ZESTRIL) 5 MG tablet Take 1 tablet (5 mg total) by mouth daily.     Multiple Vitamin (MULTIVITAMIN) tablet Take 1 tablet by mouth daily.     omeprazole (PRILOSEC) 40 MG capsule Take 1 capsule (40 mg total) by mouth daily. 150 capsule 1   ondansetron (ZOFRAN) 4 MG tablet Take 1 tablet (4 mg total) by mouth every 8 (eight) hours as needed for nausea or vomiting. 40 tablet 0   rosuvastatin (CRESTOR) 20 MG tablet Take 1 tablet (20 mg total) by mouth daily. 90 tablet 3   testosterone cypionate (DEPOTESTOSTERONE CYPIONATE) 200 MG/ML injection Inject 160 mg into the skin every 14 (fourteen) days.     amoxicillin (AMOXIL) 500 MG capsule Take 4 pills one hour prior to dental work 8 capsule 2   No current facility-administered medications on file prior to visit.    ROS see history of present illness  Objective  Physical Exam Vitals:   02/04/23 0913  BP: 138/88  Pulse: 72  Temp: 98.2 F (36.8 C)  SpO2: 99%    BP Readings from Last 3 Encounters:  02/04/23 138/88  11/27/22 118/78  07/08/22 128/82   Wt Readings from Last 3 Encounters:  02/04/23 173 lb 9.6 oz (78.7 kg)  01/17/23 175 lb (79.4 kg)  11/27/22 170 lb 3.2 oz (77.2 kg)  Physical Exam Constitutional:      General: He is not in acute distress.    Appearance: Normal appearance.  HENT:     Head: Normocephalic.  Cardiovascular:     Rate and Rhythm: Normal rate and regular rhythm.     Heart sounds: Normal heart sounds.  Pulmonary:     Effort: Pulmonary effort is normal.     Breath sounds: Normal breath sounds.  Skin:    General: Skin is warm and dry.  Neurological:     General: No focal deficit present.     Mental Status: He is alert.  Psychiatric:        Mood and Affect: Mood normal.        Behavior: Behavior normal.    Assessment/Plan: Please see individual problem list.  Hypogonadism male Assessment &  Plan: Chronic. Stable on injections with no new concerns. Managed by Urology. Continue current regimen. Lab work as outlined, will fax results to his Urologist. Follow up as scheduled.   Orders: -     CBC with Differential/Platelet -     Testosterone  Generalized anxiety disorder Assessment & Plan: Chronic. Anxiety is stable on Alprazolam 0.5mg  TID. No request for dose increase, and anxiety levels are manageable. Continue Alprazolam 0.5mg  TID. Non-Opioid Controlled Substance Agreement signed today in office. No refill needed at this time. PDMP reviewed.    Primary hypertension Assessment & Plan: Chronic. Blood pressure is stable on Lisinopril 5mg  daily with home readings around 127/79. Slightly elevated reading today in office. No symptoms of chest pain, shortness of breath, dizziness, or swelling. Continue Lisinopril daily.    Mixed hyperlipidemia Assessment & Plan: Chronic. Condition is stable on Crestor 20mg  daily with no symptoms of abdominal pain or muscle ache. Continue Crestor daily.   Chronic right-sided low back pain with right-sided sciatica Assessment & Plan: Chronic. History of scoliosis and arthritis with no acute exacerbation. May consider specialist referral in the future. Continue current management.    Return in about 6 months (around 08/04/2023) for Follow up.   Bethanie Dicker, NP-C [facility]

## 2023-02-04 NOTE — Assessment & Plan Note (Addendum)
Chronic. Anxiety is stable on Alprazolam 0.5mg  TID. No request for dose increase, and anxiety levels are manageable. Continue Alprazolam 0.5mg  TID. Non-Opioid Controlled Substance Agreement signed today in office. No refill needed at this time. PDMP reviewed.

## 2023-03-04 ENCOUNTER — Other Ambulatory Visit: Payer: Self-pay | Admitting: Nurse Practitioner

## 2023-03-04 DIAGNOSIS — F411 Generalized anxiety disorder: Secondary | ICD-10-CM

## 2023-03-04 MED ORDER — ALPRAZOLAM 0.5 MG PO TABS: ORAL_TABLET | ORAL | 4 refills | Status: DC

## 2023-05-31 ENCOUNTER — Encounter: Payer: Self-pay | Admitting: Nurse Practitioner

## 2023-05-31 DIAGNOSIS — G8929 Other chronic pain: Secondary | ICD-10-CM

## 2023-06-03 MED ORDER — TIZANIDINE HCL 4 MG PO TABS: 4.0000 mg | ORAL_TABLET | Freq: Two times a day (BID) | ORAL | 5 refills | Status: DC | PRN

## 2023-06-03 MED ORDER — GABAPENTIN 400 MG PO CAPS: 800.0000 mg | ORAL_CAPSULE | Freq: Three times a day (TID) | ORAL | 1 refills | Status: AC

## 2023-07-29 ENCOUNTER — Encounter: Payer: Self-pay | Admitting: Nurse Practitioner

## 2023-07-29 ENCOUNTER — Other Ambulatory Visit: Payer: Self-pay | Admitting: Nurse Practitioner

## 2023-07-29 DIAGNOSIS — F411 Generalized anxiety disorder: Secondary | ICD-10-CM

## 2023-07-29 NOTE — Telephone Encounter (Signed)
 Requesting: Xanax  Contract: Yes UDS: N/A Last Visit: 02/04/2023 Next Visit: Visit date not found Last Refill: 02/04/23  Please Advise

## 2023-08-25 ENCOUNTER — Ambulatory Visit (INDEPENDENT_AMBULATORY_CARE_PROVIDER_SITE_OTHER): Admitting: Nurse Practitioner

## 2023-08-25 VITALS — BP 98/68 | HR 67 | Temp 97.9°F | Ht 71.0 in | Wt 178.6 lb

## 2023-08-25 DIAGNOSIS — E291 Testicular hypofunction: Secondary | ICD-10-CM | POA: Diagnosis not present

## 2023-08-25 DIAGNOSIS — L299 Pruritus, unspecified: Secondary | ICD-10-CM

## 2023-08-25 DIAGNOSIS — E782 Mixed hyperlipidemia: Secondary | ICD-10-CM

## 2023-08-25 DIAGNOSIS — I10 Essential (primary) hypertension: Secondary | ICD-10-CM

## 2023-08-25 DIAGNOSIS — F411 Generalized anxiety disorder: Secondary | ICD-10-CM

## 2023-08-25 DIAGNOSIS — Z125 Encounter for screening for malignant neoplasm of prostate: Secondary | ICD-10-CM | POA: Diagnosis not present

## 2023-08-25 LAB — COMPREHENSIVE METABOLIC PANEL WITH GFR
ALT: 14 U/L (ref 0–53)
AST: 18 U/L (ref 0–37)
Albumin: 4.5 g/dL (ref 3.5–5.2)
Alkaline Phosphatase: 85 U/L (ref 39–117)
BUN: 14 mg/dL (ref 6–23)
CO2: 29 meq/L (ref 19–32)
Calcium: 9.6 mg/dL (ref 8.4–10.5)
Chloride: 100 meq/L (ref 96–112)
Creatinine, Ser: 1.16 mg/dL (ref 0.40–1.50)
GFR: 69.56 mL/min (ref >60.00)
Glucose, Bld: 79 mg/dL (ref 70–99)
Potassium: 4.7 meq/L (ref 3.5–5.1)
Sodium: 138 meq/L (ref 135–145)
Total Bilirubin: 0.4 mg/dL (ref 0.2–1.2)
Total Protein: 7.1 g/dL (ref 6.0–8.3)

## 2023-08-25 LAB — CBC WITH DIFFERENTIAL/PLATELET
Basophils Absolute: 0.1 K/uL (ref 0.0–0.1)
Basophils Relative: 0.9 % (ref 0.0–3.0)
Eosinophils Absolute: 0.4 K/uL (ref 0.0–0.7)
Eosinophils Relative: 3.8 % (ref 0.0–5.0)
HCT: 50.1 % (ref 39.0–52.0)
Hemoglobin: 17 g/dL (ref 13.0–17.0)
Lymphocytes Relative: 27.4 % (ref 12.0–46.0)
Lymphs Abs: 3.1 K/uL (ref 0.7–4.0)
MCHC: 33.9 g/dL (ref 30.0–36.0)
MCV: 89.8 fl (ref 78.0–100.0)
Monocytes Absolute: 0.6 K/uL (ref 0.1–1.0)
Monocytes Relative: 5.3 % (ref 3.0–12.0)
Neutro Abs: 7.1 K/uL (ref 1.4–7.7)
Neutrophils Relative %: 62.6 % (ref 43.0–77.0)
Platelets: 260 K/uL (ref 150.0–400.0)
RBC: 5.58 Mil/uL (ref 4.22–5.81)
RDW: 13.3 % (ref 11.5–15.5)
WBC: 11.3 K/uL — ABNORMAL HIGH (ref 4.0–10.5)

## 2023-08-25 LAB — LIPID PANEL
Cholesterol: 185 mg/dL (ref 0–200)
HDL: 31.9 mg/dL — ABNORMAL LOW (ref >39.00)
LDL Cholesterol: 108 mg/dL — ABNORMAL HIGH (ref 0–99)
NonHDL: 152.95
Total CHOL/HDL Ratio: 6
Triglycerides: 225 mg/dL — ABNORMAL HIGH (ref 0.0–149.0)
VLDL: 45 mg/dL — ABNORMAL HIGH (ref 0.0–40.0)

## 2023-08-25 LAB — PSA, MEDICARE: PSA: 1.9 ng/mL (ref 0.10–4.00)

## 2023-08-25 LAB — TESTOSTERONE: Testosterone: 837.4 ng/dL (ref 300.00–890.00)

## 2023-08-25 MED ORDER — ALPRAZOLAM 0.5 MG PO TABS: ORAL_TABLET | ORAL | 5 refills | Status: AC

## 2023-08-25 NOTE — Progress Notes (Signed)
 Gregory Glance, NP-C Phone: [phone]  Alm Gregory Stokes Sr. is a 58 y.o. male who presents today for follow up.   Discussed the use of AI scribe software for clinical note transcription with the patient, who gave verbal consent to proceed.  History of Present Illness   Gregory LYSTER Sr. is a 58 year old male who presents for a routine six-month follow-up.  He monitors his blood pressure at home, which is usually around 117 to 120 over 80. Recently, his blood pressure has been lower, with today's reading at 98 over 68. No dizziness or lightheadedness, and he feels well overall.  He takes lisinopril  5 mg and Crestor  as part of his medication regimen. No chest pain, shortness of breath, or swelling in his legs.  He requires a refill for Xanax , which he has been taking for many years, generally around three times a day as needed.  He experiences occasional itchiness in his hands, which he attributes to frequent hand washing and possibly certain soaps. He describes the sensation as similar to having a chemical on his hands after washing a car.     Social History   Tobacco Use  Smoking Status Former   Current packs/day: 0.00   Types: Cigarettes   Quit date: 2010   Years since quitting: 15.6  Smokeless Tobacco Never  Tobacco Comments   smoked age [age] stopped then age 58 x 1 years max 1/2 ppd     Current Outpatient Medications on File Prior to Visit  Medication Sig Dispense Refill   albuterol  (PROVENTIL  HFA;VENTOLIN  HFA) 108 (90 Base) MCG/ACT inhaler Inhale 1-2 puffs into the lungs every 6 (six) hours as needed for wheezing or shortness of breath. 1 Inhaler 1   amoxicillin  (AMOXIL ) 500 MG capsule Take 4 pills one hour prior to dental work 8 capsule 2   Avanafil (STENDRA) 200 MG TABS TAKE 1/2 TABLET BY MOUTH AS DIRECTED     gabapentin  (NEURONTIN ) 400 MG capsule Take 2 capsules (800 mg total) by mouth 3 (three) times daily. 540 capsule 1   lisinopril  (ZESTRIL ) 5 MG tablet Take 1 tablet  (5 mg total) by mouth daily.     Multiple Vitamin (MULTIVITAMIN) tablet Take 1 tablet by mouth daily.     ondansetron  (ZOFRAN ) 4 MG tablet Take 1 tablet (4 mg total) by mouth every 8 (eight) hours as needed for nausea or vomiting. 40 tablet 0   rosuvastatin  (CRESTOR ) 20 MG tablet Take 1 tablet (20 mg total) by mouth daily. 90 tablet 3   testosterone  cypionate (DEPOTESTOSTERONE CYPIONATE) 200 MG/ML injection Inject 160 mg into the skin every 14 (fourteen) days.     tiZANidine  (ZANAFLEX ) 4 MG tablet Take 1 tablet (4 mg total) by mouth 2 (two) times daily as needed for muscle spasms. 60 tablet 5   No current facility-administered medications on file prior to visit.     ROS see history of present illness  Objective  Physical Exam Vitals:   08/25/23 1326  BP: 98/68  Pulse: 67  Temp: 97.9 F (36.6 C)  SpO2: 95%    BP Readings from Last 3 Encounters:  08/25/23 98/68  02/04/23 138/88  11/27/22 118/78   Wt Readings from Last 3 Encounters:  08/25/23 178 lb 9.6 oz (81 kg)  02/04/23 173 lb 9.6 oz (78.7 kg)  01/17/23 175 lb (79.4 kg)    Physical Exam Constitutional:      General: He is not in acute distress.    Appearance: Normal appearance.  HENT:     Head: Normocephalic.  Cardiovascular:     Rate and Rhythm: Normal rate and regular rhythm.     Heart sounds: Normal heart sounds.  Pulmonary:     Effort: Pulmonary effort is normal.     Breath sounds: Normal breath sounds.  Skin:    General: Skin is warm and dry.  Neurological:     General: No focal deficit present.     Mental Status: He is alert.  Psychiatric:        Mood and Affect: Mood normal.        Behavior: Behavior normal.      Assessment/Plan: Please see individual problem list.  Itching of both hands Assessment & Plan: Intermittent hand itching, possibly due to frequent washing and harsh soaps. Use moisturizing lotion such as Aquaphor to alleviate dryness and itching. Be mindful of soap ingredients and avoid  those with alcohol or harsh chemicals.    Primary hypertension Assessment & Plan: Chronic. Blood pressure is low normal at 98/68 mmHg without symptoms, having decreased over the years on low-dose lisinopril . Currently taking lisinopril  5 mg daily. Monitor blood pressure at home, especially if readings consistently fall in the 90s/60s or dizziness occurs. Ensure adequate hydration. Adjust lisinopril  dosage if hypotension symptoms develop.   Orders: -     Comprehensive metabolic panel with GFR  Generalized anxiety disorder Assessment & Plan: Chronic. Anxiety is controlled with Alprazolam  0.5 mg three times daily. Stable on medication for many years, anxiety levels are manageable. Continue Alprazolam  0.5mg  three times daily as needed. PDMP reviewed.   Orders: -     ALPRAZolam ; TAKE 1 TABLET(0.5 MG) BY MOUTH THREE TIMES DAILY AS NEEDED FOR ANXIETY.  Dispense: 90 tablet; Refill: 5  Mixed hyperlipidemia Assessment & Plan: Chronic. Cholesterol is managed with Crestor  20mg  daily with no symptoms of abdominal pain or muscle ache. Continue Crestor  daily.  Orders: -     Lipid panel  Hypogonadism male Assessment & Plan: Chronic. Managed with testosterone  injections every 14 days with no new concerns. Managed by Urology. Continue current regimen. Lab work as outlined, will fax results to his Urologist. Follow up as scheduled.   Orders: -     Testosterone  -     CBC with Differential/Platelet  Screening PSA (prostate specific antigen) -     PSA, Medicare     Return in about 6 months (around 02/25/2024) for Follow up.   Gregory Glance, NP-C [facility]

## 2023-08-27 ENCOUNTER — Other Ambulatory Visit: Payer: Self-pay

## 2023-08-27 ENCOUNTER — Ambulatory Visit: Payer: Self-pay | Admitting: Nurse Practitioner

## 2023-08-27 MED ORDER — OMEPRAZOLE 40 MG PO CPDR: 40.0000 mg | DELAYED_RELEASE_CAPSULE | Freq: Every day | ORAL | 1 refills | Status: AC

## 2023-08-28 ENCOUNTER — Encounter: Payer: Self-pay | Admitting: Nurse Practitioner

## 2023-09-02 ENCOUNTER — Encounter: Payer: Self-pay | Admitting: Nurse Practitioner

## 2023-09-02 DIAGNOSIS — L299 Pruritus, unspecified: Secondary | ICD-10-CM | POA: Insufficient documentation

## 2023-09-02 NOTE — Assessment & Plan Note (Addendum)
 Chronic. Anxiety is controlled with Alprazolam  0.5 mg three times daily. Stable on medication for many years, anxiety levels are manageable. Continue Alprazolam  0.5mg  three times daily as needed. PDMP reviewed.

## 2023-09-02 NOTE — Assessment & Plan Note (Signed)
 Chronic. Blood pressure is low normal at 98/68 mmHg without symptoms, having decreased over the years on low-dose lisinopril . Currently taking lisinopril  5 mg daily. Monitor blood pressure at home, especially if readings consistently fall in the 90s/60s or dizziness occurs. Ensure adequate hydration. Adjust lisinopril  dosage if hypotension symptoms develop.

## 2023-09-02 NOTE — Assessment & Plan Note (Addendum)
 Chronic. Cholesterol is managed with Crestor  20mg  daily with no symptoms of abdominal pain or muscle ache. Continue Crestor  daily.

## 2023-09-02 NOTE — Assessment & Plan Note (Signed)
 Chronic. Managed with testosterone  injections every 14 days with no new concerns. Managed by Urology. Continue current regimen. Lab work as outlined, will fax results to his Urologist. Follow up as scheduled.

## 2023-09-02 NOTE — Assessment & Plan Note (Signed)
 Intermittent hand itching, possibly due to frequent washing and harsh soaps. Use moisturizing lotion such as Aquaphor to alleviate dryness and itching. Be mindful of soap ingredients and avoid those with alcohol or harsh chemicals.

## 2023-09-15 ENCOUNTER — Encounter: Payer: Self-pay | Admitting: Family

## 2023-09-16 ENCOUNTER — Encounter: Payer: Self-pay | Admitting: Family

## 2023-09-16 ENCOUNTER — Telehealth: Payer: Self-pay | Admitting: Family

## 2023-09-16 ENCOUNTER — Ambulatory Visit (INDEPENDENT_AMBULATORY_CARE_PROVIDER_SITE_OTHER): Admitting: Family

## 2023-09-16 VITALS — BP 122/71 | HR 71 | Temp 98.7°F | Ht 71.0 in | Wt 183.0 lb

## 2023-09-16 DIAGNOSIS — G8929 Other chronic pain: Secondary | ICD-10-CM

## 2023-09-16 DIAGNOSIS — M5116 Intervertebral disc disorders with radiculopathy, lumbar region: Secondary | ICD-10-CM | POA: Diagnosis not present

## 2023-09-16 MED ORDER — DULOXETINE HCL 30 MG PO CPEP: 30.0000 mg | ORAL_CAPSULE | Freq: Every day | ORAL | 3 refills | Status: DC

## 2023-09-16 NOTE — Telephone Encounter (Signed)
  Call orthopedic ( below) whom performed left total knee  Please call to confirm if he can have MRI lumbar spine  Procedure: LEFT TOTAL KNEE ARTHROPLASTY; Surgeon: Jerri Kay HERO, MD; Location: [facility] OR; Service: Orthopedics; Laterality: Left;

## 2023-09-16 NOTE — Patient Instructions (Addendum)
 Trial Cymbalta  for pain.    Start scheduling tylenol  650mg  once dialy.   As discussed, let's start by scheduling Tylenol  Arthritis which is a 650mg  tablet .    Do not exceed 6 tablets a day of Tylenol  Arthritis 650mg  tablet   For example , you could take two tablets in the morning ( 8am) and then two tablets again at 4pm.   Maximum daily dose of acetaminophen  4 g per day from all sources.  If you are taking another medication which includes acetaminophen  (Tylenol ) which may be in cough and cold preparations or pain medication such as Percocet, you will need to factor that into your total daily dose to be safe.  Please let me know if any questions  A great article regarding how to safely take and dose tylenol  found below.  Title : 'Acetaminophen  safety: Be cautious but not afraid'  https://www.health.[employer].edu/pain/acetaminophen -safety-be-cautious-but-not-afraid

## 2023-09-16 NOTE — Progress Notes (Unsigned)
 Assessment & Plan:  There are no diagnoses linked to this encounter.   Return precautions given.   Risks, benefits, and alternatives of the medications and treatment plan prescribed today were discussed, and patient expressed understanding.   Education regarding symptom management and diagnosis given to patient on AVS either electronically or printed.  No follow-ups on file.  Rollene Northern, FNP  Subjective:    Patient ID: [name], male    DOB: January 20, 1965, 58 y.o.   MRN: [id number]  CC: SHYLER HOLZMAN Sr. is a 58 y.o. male who presents today for an acute visit.    HPI: Complains of acute on chronic low back pain  About a 10 days ago, he was taking pants leg off and got caught in his pants; he had low back spasm so severe that he had to lay on the ground , rates 10/10.   He denies numbness in legs, leg weakness, urinary or fecal continence.  Denies trauma.    He is wearing back brace which has been helpful.   He is not sedated on gabapentin  400mg  TID.    He takes ibuprofen 800mg  very seldom.    He has been on opioids in the past with difficulty getting off.   Former smoker Colonoscopy UTD, repeat in 3 years  Dr Tanya MRI lumbar 2015 1. L4-L5 moderate central stenosis with grade I anterolisthesis and  bilateral facet arthritis. Bilateral lateral recess stenosis  potentially affects both L5 nerves.  2. L5-S1 right paracentral small protrusion without resulting  stenosis.  3. Mild degenerative disease at L2-L3 and L3-L4 without stenosis.  Allergies: Tessalon  perles [benzonatate ] Current Outpatient Medications on File Prior to Visit  Medication Sig Dispense Refill   albuterol  (PROVENTIL  HFA;VENTOLIN  HFA) 108 (90 Base) MCG/ACT inhaler Inhale 1-2 puffs into the lungs every 6 (six) hours as needed for wheezing or shortness of breath. 1 Inhaler 1   ALPRAZolam  (XANAX ) 0.5 MG tablet TAKE 1 TABLET(0.5 MG) BY MOUTH THREE TIMES DAILY AS NEEDED FOR ANXIETY. 90 tablet  5   amoxicillin  (AMOXIL ) 500 MG capsule Take 4 pills one hour prior to dental work 8 capsule 2   Avanafil (STENDRA) 200 MG TABS TAKE 1/2 TABLET BY MOUTH AS DIRECTED     gabapentin  (NEURONTIN ) 400 MG capsule Take 2 capsules (800 mg total) by mouth 3 (three) times daily. 540 capsule 1   lisinopril  (ZESTRIL ) 5 MG tablet Take 1 tablet (5 mg total) by mouth daily.     Multiple Vitamin (MULTIVITAMIN) tablet Take 1 tablet by mouth daily.     omeprazole  (PRILOSEC) 40 MG capsule Take 1 capsule (40 mg total) by mouth daily. 150 capsule 1   ondansetron  (ZOFRAN ) 4 MG tablet Take 1 tablet (4 mg total) by mouth every 8 (eight) hours as needed for nausea or vomiting. 40 tablet 0   rosuvastatin  (CRESTOR ) 20 MG tablet Take 1 tablet (20 mg total) by mouth daily. 90 tablet 3   testosterone  cypionate (DEPOTESTOSTERONE CYPIONATE) 200 MG/ML injection Inject 160 mg into the skin every 14 (fourteen) days.     tiZANidine  (ZANAFLEX ) 4 MG tablet Take 1 tablet (4 mg total) by mouth 2 (two) times daily as needed for muscle spasms. 60 tablet 5   No current facility-administered medications on file prior to visit.    Review of Systems    Objective:    BP 122/71   Pulse 71   Temp 98.7 F (37.1 C) (Oral)   Ht 5' 11 (1.803 m)  Wt 183 lb (83 kg)   SpO2 93%   BMI 25.52 kg/m   BP Readings from Last 3 Encounters:  09/16/23 122/71  08/25/23 98/68  02/04/23 138/88   Wt Readings from Last 3 Encounters:  09/16/23 183 lb (83 kg)  08/25/23 178 lb 9.6 oz (81 kg)  02/04/23 173 lb 9.6 oz (78.7 kg)      09/16/2023    9:56 AM 01/17/2023    8:27 AM 11/27/2022   10:43 AM  Depression screen PHQ 2/9  Decreased Interest 0 0 0  Down, Depressed, Hopeless 0 0 0  PHQ - 2 Score 0 0 0  Altered sleeping  0 0  Tired, decreased energy  0 0  Change in appetite  0 0  Feeling bad or failure about yourself   0 0  Trouble concentrating  0 0  Moving slowly or fidgety/restless  0 0  Suicidal thoughts  0 0  PHQ-9 Score  0 0   Difficult doing work/chores  Not difficult at all Not difficult at all    Physical Exam

## 2023-09-17 ENCOUNTER — Other Ambulatory Visit: Payer: Self-pay | Admitting: Nurse Practitioner

## 2023-09-17 DIAGNOSIS — G8929 Other chronic pain: Secondary | ICD-10-CM

## 2023-09-18 NOTE — Telephone Encounter (Signed)
 Routed request via epic. Will hold until response received.

## 2023-09-19 NOTE — Telephone Encounter (Signed)
 Advise pt what Dr Jerri stated regarding MRI  I have ordered MRI lumbar  Let us  know if you dont hear back within 2 weeks in regards to an appointment being scheduled.

## 2023-09-19 NOTE — Telephone Encounter (Signed)
 Yes MRI is fine.  Thanks for checking

## 2023-09-20 NOTE — Assessment & Plan Note (Addendum)
 Acute on chronic.  History of known moderate central stenosis.  Fortunately no radicular symptoms at this time.  Jointly agreed MRI lumbar spine to monitor progression of disease.  I have ordered.  Opted to start Cymbalta  30mg  for nonopioid pain management.  Counseled on how to schedule Tylenol  arthritis.  Close follow-up

## 2023-09-30 ENCOUNTER — Ambulatory Visit
Admission: RE | Admit: 2023-09-30 | Discharge: 2023-09-30 | Disposition: A | Discharge status: Home / Self Care | Source: Ambulatory Visit | Attending: Family | Admitting: Family

## 2023-09-30 DIAGNOSIS — M5441 Lumbago with sciatica, right side: Secondary | ICD-10-CM | POA: Diagnosis present

## 2023-09-30 DIAGNOSIS — G8929 Other chronic pain: Secondary | ICD-10-CM | POA: Insufficient documentation

## 2023-10-06 ENCOUNTER — Ambulatory Visit: Payer: Self-pay | Admitting: Family

## 2023-10-06 DIAGNOSIS — G8929 Other chronic pain: Secondary | ICD-10-CM

## 2023-10-07 NOTE — Progress Notes (Unsigned)
 Referring Physician:  Dineen Rollene MATSU, FNP [address] [place],  [place] [address]  Primary Physician:  Gretel App, NP  History of Present Illness: 10/07/2023 [name] is here today with a chief complaint of ***  Low back pain  Duration: *** Location: *** Quality: *** Severity: ***  Precipitating: aggravated by *** Modifying factors: made better by *** Weakness: none Timing: *** Bowel/Bladder Dysfunction: none  Conservative measures:  Physical therapy: *** has not participated in  Multimodal medical therapy including regular antiinflammatories: *** Cymbalta , Tylenol  Injections: no epidural steroid injections  Past Surgery: ***none  Gregory DELENA Seltzer Sr. has ***no symptoms of cervical myelopathy.  The symptoms are causing a significant impact on the patient's life.   Review of Systems:  A 10 point review of systems is negative, except for the pertinent positives and negatives detailed in the HPI.  Past Medical History: Past Medical History:  Diagnosis Date   Anxiety    Bronchitis    GERD (gastroesophageal reflux disease)    Hypertension    Insomnia    Low testosterone     Osteoarthritis    Status post total knee replacement 07/09/2021    Past Surgical History: Past Surgical History:  Procedure Laterality Date   COLONOSCOPY WITH PROPOFOL  N/A 01/24/2021   Procedure: COLONOSCOPY WITH PROPOFOL ;  Surgeon: Unk Corinn Skiff, MD;  Location: [facility] ENDOSCOPY;  Service: Gastroenterology;  Laterality: N/A;   NASAL SINUS SURGERY     Dr. Gretta   TOTAL KNEE ARTHROPLASTY Left 07/09/2021   Procedure: LEFT TOTAL KNEE ARTHROPLASTY;  Surgeon: Jerri Kay HERO, MD;  Location: [facility] OR;  Service: Orthopedics;  Laterality: Left;   Unremarkable     WRIST SURGERY Right     Allergies: Allergies as of 10/09/2023 - Review Complete 09/16/2023  Allergen Reaction Noted   Tessalon  perles [benzonatate ]  03/02/2017    Medications: Outpatient Encounter Medications  as of 10/09/2023  Medication Sig   albuterol  (PROVENTIL  HFA;VENTOLIN  HFA) 108 (90 Base) MCG/ACT inhaler Inhale 1-2 puffs into the lungs every 6 (six) hours as needed for wheezing or shortness of breath.   ALPRAZolam  (XANAX ) 0.5 MG tablet TAKE 1 TABLET(0.5 MG) BY MOUTH THREE TIMES DAILY AS NEEDED FOR ANXIETY.   amoxicillin  (AMOXIL ) 500 MG capsule Take 4 pills one hour prior to dental work   aspirin  81 MG chewable tablet Chew 81 mg by mouth daily.   Avanafil (STENDRA) 200 MG TABS TAKE 1/2 TABLET BY MOUTH AS DIRECTED   DULoxetine  (CYMBALTA ) 30 MG capsule Take 1 capsule (30 mg total) by mouth daily.   gabapentin  (NEURONTIN ) 400 MG capsule Take 2 capsules (800 mg total) by mouth 3 (three) times daily.   lisinopril  (ZESTRIL ) 5 MG tablet Take 1 tablet (5 mg total) by mouth daily.   Multiple Vitamin (MULTIVITAMIN) tablet Take 1 tablet by mouth daily.   omeprazole  (PRILOSEC) 40 MG capsule Take 1 capsule (40 mg total) by mouth daily.   ondansetron  (ZOFRAN ) 4 MG tablet Take 1 tablet (4 mg total) by mouth every 8 (eight) hours as needed for nausea or vomiting.   rosuvastatin  (CRESTOR ) 20 MG tablet Take 1 tablet (20 mg total) by mouth daily.   testosterone  cypionate (DEPOTESTOSTERONE CYPIONATE) 200 MG/ML injection Inject 160 mg into the skin every 14 (fourteen) days.   tiZANidine  (ZANAFLEX ) 4 MG tablet Take 1 tablet (4 mg total) by mouth 2 (two) times daily as needed for muscle spasms.   No facility-administered encounter medications on file as of 10/09/2023.  Social History: Social History   Tobacco Use   Smoking status: Former    Current packs/day: 0.00    Types: Cigarettes    Quit date: 2010    Years since quitting: 15.7   Smokeless tobacco: Never   Tobacco comments:    smoked age [age] stopped then age 89 x 1 years max 1/2 ppd   Vaping Use   Vaping status: Never Used  Substance Use Topics   Alcohol use: No    Alcohol/week: 0.0 standard drinks of alcohol   Drug use: No    Family Medical  History: Family History  Problem Relation Age of Onset   Lung cancer Father        deceased   Parkinson's disease Mother     Physical Examination: @VITALWITHPAIN @  General: Patient is well developed, well nourished, calm, collected, and in no apparent distress. Attention to examination is appropriate.  Psychiatric: Patient is non-anxious.  Head:  Pupils equal, round, and reactive to light.  ENT:  Oral mucosa appears well hydrated.  Neck:   Supple.  ***Full range of motion.  Respiratory: Patient is breathing without any difficulty.  Extremities: No edema.  Vascular: Palpable dorsal pedal pulses.  Skin:   On exposed skin, there are no abnormal skin lesions.  NEUROLOGICAL:     Awake, alert, oriented to person, place, and time.  Speech is clear and fluent. Fund of knowledge is appropriate.   Cranial Nerves: Pupils equal round and reactive to light.  Facial tone is symmetric.  Facial sensation is symmetric.  ROM of spine: ***full.  Palpation of spine: ***non tender.    Strength: Side Biceps Triceps Deltoid Interossei Grip Wrist Ext. Wrist Flex.  R 5 5 5 5 5 5 5   L 5 5 5 5 5 5 5    Side Iliopsoas Quads Hamstring PF DF EHL  R 5 5 5 5 5 5   L 5 5 5 5 5 5    Reflexes are ***2+ and symmetric at the biceps, triceps, brachioradialis, patella and achilles.   Hoffman's is absent.  Clonus is not present.  Toes are down-going.  Bilateral upper and lower extremity sensation is intact to light touch.    Gait is normal.   No difficulty with tandem gait.   No evidence of dysmetria noted.  Medical Decision Making  Imaging: ***  I have personally reviewed the images and agree with the above interpretation.  Assessment and Plan: Gregory Stokes is a pleasant 58 y.o. male with ***    Thank you for involving me in the care of this patient.   I spent a total of *** minutes in both face-to-face and non-face-to-face activities for this visit on the date of this encounter.   [employer] [facility]

## 2023-10-07 NOTE — Progress Notes (Unsigned)
 Referring Physician:  Dineen Rollene MATSU, FNP [address] [place],  [place] [address]  Primary Physician:  Gretel App, NP  History of Present Illness: 10/07/2023 Mr. Anthem Frazer is here today with a chief complaint of ***  Chronic right sided lower back pain. Numbness, tingling, and weakness?  Duration: *** Location: *** Quality: *** Severity: ***  Precipitating: aggravated by *** Modifying factors: made better by *** Weakness: none Timing: *** Bowel/Bladder Dysfunction: none  Conservative measures:  Physical therapy: *** Has not participated in? Multimodal medical therapy including regular antiinflammatories: *** acetaminophen , Cymbalta  Injections: *** epidural steroid injections?  Past Surgery: ***  [name] has ***no symptoms of cervical myelopathy.  The symptoms are causing a significant impact on the patient's life.   Review of Systems:  A 10 point review of systems is negative, except for the pertinent positives and negatives detailed in the HPI.  Past Medical History: Past Medical History:  Diagnosis Date   Anxiety    Bronchitis    GERD (gastroesophageal reflux disease)    Hypertension    Insomnia    Low testosterone     Osteoarthritis    Status post total knee replacement 07/09/2021    Past Surgical History: Past Surgical History:  Procedure Laterality Date   COLONOSCOPY WITH PROPOFOL  N/A 01/24/2021   Procedure: COLONOSCOPY WITH PROPOFOL ;  Surgeon: Unk Corinn Skiff, MD;  Location: [facility]COPY;  Service: Gastroenterology;  Laterality: N/A;   NASAL SINUS SURGERY     Dr. Gretta   TOTAL KNEE ARTHROPLASTY Left 07/09/2021   Procedure: LEFT TOTAL KNEE ARTHROPLASTY;  Surgeon: Jerri Kay HERO, MD;  Location: [facility];  Service: Orthopedics;  Laterality: Left;   Unremarkable     WRIST SURGERY Right     Allergies: Allergies as of 10/09/2023 - Review Complete 09/16/2023  Allergen Reaction Noted   Tessalon  perles [benzonatate ]   03/02/2017    Medications: Outpatient Encounter Medications as of 10/09/2023  Medication Sig   albuterol  (PROVENTIL  HFA;VENTOLIN  HFA) 108 (90 Base) MCG/ACT inhaler Inhale 1-2 puffs into the lungs every 6 (six) hours as needed for wheezing or shortness of breath.   ALPRAZolam  (XANAX ) 0.5 MG tablet TAKE 1 TABLET(0.5 MG) BY MOUTH THREE TIMES DAILY AS NEEDED FOR ANXIETY.   amoxicillin  (AMOXIL ) 500 MG capsule Take 4 pills one hour prior to dental work   aspirin  81 MG chewable tablet Chew 81 mg by mouth daily.   Avanafil (STENDRA) 200 MG TABS TAKE 1/2 TABLET BY MOUTH AS DIRECTED   DULoxetine  (CYMBALTA ) 30 MG capsule Take 1 capsule (30 mg total) by mouth daily.   gabapentin  (NEURONTIN ) 400 MG capsule Take 2 capsules (800 mg total) by mouth 3 (three) times daily.   lisinopril  (ZESTRIL ) 5 MG tablet Take 1 tablet (5 mg total) by mouth daily.   Multiple Vitamin (MULTIVITAMIN) tablet Take 1 tablet by mouth daily.   omeprazole  (PRILOSEC) 40 MG capsule Take 1 capsule (40 mg total) by mouth daily.   ondansetron  (ZOFRAN ) 4 MG tablet Take 1 tablet (4 mg total) by mouth every 8 (eight) hours as needed for nausea or vomiting.   rosuvastatin  (CRESTOR ) 20 MG tablet Take 1 tablet (20 mg total) by mouth daily.   testosterone  cypionate (DEPOTESTOSTERONE CYPIONATE) 200 MG/ML injection Inject 160 mg into the skin every 14 (fourteen) days.   tiZANidine  (ZANAFLEX ) 4 MG tablet Take 1 tablet (4 mg total) by mouth 2 (two) times daily as needed for muscle spasms.   No facility-administered encounter medications on  file as of 10/09/2023.    Social History: Social History   Tobacco Use   Smoking status: Former    Current packs/day: 0.00    Types: Cigarettes    Quit date: 2010    Years since quitting: 15.7   Smokeless tobacco: Never   Tobacco comments:    smoked age [age] stopped then age 5 x 1 years max 1/2 ppd   Vaping Use   Vaping status: Never Used  Substance Use Topics   Alcohol use: No    Alcohol/week: 0.0  standard drinks of alcohol   Drug use: No    Family Medical History: Family History  Problem Relation Age of Onset   Lung cancer Father        deceased   Parkinson's disease Mother     Physical Examination: @VITALWITHPAIN @  General: Patient is well developed, well nourished, calm, collected, and in no apparent distress. Attention to examination is appropriate.  Psychiatric: Patient is non-anxious.  Head:  Pupils equal, round, and reactive to light.  ENT:  Oral mucosa appears well hydrated.  Neck:   Supple.  ***Full range of motion.  Respiratory: Patient is breathing without any difficulty.  Extremities: No edema.  Vascular: Palpable dorsal pedal pulses.  Skin:   On exposed skin, there are no abnormal skin lesions.  NEUROLOGICAL:     Awake, alert, oriented to person, place, and time.  Speech is clear and fluent. Fund of knowledge is appropriate.   Cranial Nerves: Pupils equal round and reactive to light.  Facial tone is symmetric.  Facial sensation is symmetric.  ROM of spine: ***full.  Palpation of spine: ***non tender.    Strength: Side Biceps Triceps Deltoid Interossei Grip Wrist Ext. Wrist Flex.  R 5 5 5 5 5 5 5   L 5 5 5 5 5 5 5    Side Iliopsoas Quads Hamstring PF DF EHL  R 5 5 5 5 5 5   L 5 5 5 5 5 5    Reflexes are ***2+ and symmetric at the biceps, triceps, brachioradialis, patella and achilles.   Hoffman's is absent.  Clonus is not present.  Toes are down-going.  Bilateral upper and lower extremity sensation is intact to light touch.    Gait is normal.   No difficulty with tandem gait.   No evidence of dysmetria noted.  Medical Decision Making  Imaging: ***  I have personally reviewed the images and agree with the above interpretation.  Assessment and Plan: Mr. Flight is a pleasant 58 y.o. male with ***    Thank you for involving me in the care of this patient.   I spent a total of *** minutes in both face-to-face and non-face-to-face  activities for this visit on the date of this encounter.   [facility] Dept. of Neurosurgery

## 2023-10-09 ENCOUNTER — Ambulatory Visit (INDEPENDENT_AMBULATORY_CARE_PROVIDER_SITE_OTHER): Admitting: Neurosurgery

## 2023-10-09 VITALS — BP 138/94 | Ht 71.0 in | Wt 183.1 lb

## 2023-10-09 DIAGNOSIS — M51369 Other intervertebral disc degeneration, lumbar region without mention of lumbar back pain or lower extremity pain: Secondary | ICD-10-CM

## 2023-10-09 DIAGNOSIS — M5136 Other intervertebral disc degeneration, lumbar region with discogenic back pain only: Secondary | ICD-10-CM | POA: Diagnosis not present

## 2023-10-09 DIAGNOSIS — M48061 Spinal stenosis, lumbar region without neurogenic claudication: Secondary | ICD-10-CM

## 2023-10-09 DIAGNOSIS — M47819 Spondylosis without myelopathy or radiculopathy, site unspecified: Secondary | ICD-10-CM

## 2023-10-09 DIAGNOSIS — M545 Low back pain, unspecified: Secondary | ICD-10-CM

## 2023-11-25 ENCOUNTER — Other Ambulatory Visit: Payer: Self-pay | Admitting: Nurse Practitioner

## 2023-11-25 ENCOUNTER — Telehealth: Payer: Self-pay

## 2023-11-25 NOTE — Telephone Encounter (Signed)
 Copied from CRM #[id number]. Topic: Clinical - Medical Advice >> Nov 25, 2023 10:04 AM Charolett L wrote: Reason for CRM: Patient is requesting to receive a tetanus shot

## 2023-11-27 ENCOUNTER — Encounter: Payer: Self-pay | Admitting: Nurse Practitioner

## 2024-01-19 ENCOUNTER — Ambulatory Visit: Payer: Medicare Other | Admitting: *Deleted

## 2024-01-19 VITALS — Ht 71.0 in | Wt 180.0 lb

## 2024-01-19 DIAGNOSIS — Z0000 Encounter for general adult medical examination without abnormal findings: Secondary | ICD-10-CM

## 2024-01-19 NOTE — Progress Notes (Signed)
 "  Chief Complaint  Patient presents with   Medicare Wellness     Subjective:   Gregory PEMBLE Sr. is a 59 y.o. male who presents for a Medicare Annual Wellness Visit.  Visit info / Clinical Intake: Medicare Wellness Visit Type:: Welcome to [employer] (IP[facility]) Persons participating in visit and providing information:: patient Medicare Wellness Visit Mode:: Telephone If telephone:: video declined Since this visit was completed virtually, some vitals may be partially provided or unavailable. Missing vitals are due to the limitations of the virtual format.: Unable to obtain vitals - no equipment If Telephone or Video please confirm:: I connected with patient using audio/video enable telemedicine. I verified patient identity with two identifiers, discussed telehealth limitations, and patient agreed to proceed. Patient Location:: Home Provider Location:: Office/Home Interpreter Needed?: No Pre-visit prep was completed: yes AWV questionnaire completed by patient prior to visit?: no Living arrangements:: lives with spouse/significant other Patient's Overall Health Status Rating: (!) fair Typical amount of pain: some (arthritis and spine pain) Does pain affect daily life?: (!) yes Are you currently prescribed opioids?: no  Dietary Habits and Nutritional Risks How many meals a day?: 2 Eats fruit and vegetables daily?: yes Most meals are obtained by: preparing own meals In the last 2 weeks, have you had any of the following?: none Diabetic:: no  Functional Status Activities of Daily Living (to include ambulation/medication): Independent Ambulation: Independent with device- listed below Home Assistive Devices/Equipment: Cane (and a walker at times) Medication Administration: Independent Home Management (perform basic housework or laundry): Needs assistance (comment) Manage your own finances?: yes Primary transportation is: driving Concerns about vision?: no *vision screening is required for  WTM* Concerns about hearing?: no  Fall Screening Falls in the past year?: 0 Number of falls in past year: 0 Was there an injury with Fall?: 0 Fall Risk Category Calculator: 0 Patient Fall Risk Level: Low Fall Risk  Fall Risk Patient at Risk for Falls Due to: No Fall Risks Fall risk Follow up: Falls evaluation completed; Falls prevention discussed  Home and Transportation Safety: All rugs have non-skid backing?: N/A, no rugs All stairs or steps have railings?: yes Grab bars in the bathtub or shower?: yes Have non-skid surface in bathtub or shower?: yes Good home lighting?: yes Regular seat belt use?: yes Hospital stays in the last year:: no  Cognitive Assessment Difficulty concentrating, remembering, or making decisions? : no Will 6CIT or Mini Cog be Completed: yes What year is it?: 0 points What month is it?: 0 points Give patient an address phrase to remember (5 components): [address] [place] About what time is it?: 0 points Count backwards from 20 to 1: 0 points Say the months of the year in reverse: 0 points Repeat the address phrase from earlier: 0 points 6 CIT Score: 0 points  Advance Directives (For Healthcare) Does Patient Have a Medical Advance Directive?: No Would patient like information on creating a medical advance directive?: No - Patient declined  Reviewed/Updated  Reviewed/Updated: Reviewed All (Medical, Surgical, Family, Medications, Allergies, Care Teams, Patient Goals)    Allergies (verified) Tessalon  perles [benzonatate ]   Current Medications (verified) Outpatient Encounter Medications as of 01/19/2024  Medication Sig   albuterol  (PROVENTIL  HFA;VENTOLIN  HFA) 108 (90 Base) MCG/ACT inhaler Inhale 1-2 puffs into the lungs every 6 (six) hours as needed for wheezing or shortness of breath.   ALPRAZolam  (XANAX ) 0.5 MG tablet TAKE 1 TABLET(0.5 MG) BY MOUTH THREE TIMES DAILY AS NEEDED FOR ANXIETY.   amoxicillin  (AMOXIL )  500 MG capsule Take 4  pills one hour prior to dental work   aspirin  81 MG chewable tablet Chew 81 mg by mouth daily.   Avanafil (STENDRA) 200 MG TABS TAKE 1/2 TABLET BY MOUTH AS DIRECTED   gabapentin  (NEURONTIN ) 400 MG capsule Take 2 capsules (800 mg total) by mouth 3 (three) times daily.   lisinopril  (ZESTRIL ) 5 MG tablet Take 1 tablet (5 mg total) by mouth daily.   Multiple Vitamin (MULTIVITAMIN) tablet Take 1 tablet by mouth daily.   omeprazole  (PRILOSEC) 40 MG capsule Take 1 capsule (40 mg total) by mouth daily.   testosterone  cypionate (DEPOTESTOSTERONE CYPIONATE) 200 MG/ML injection Inject 160 mg into the skin every 14 (fourteen) days.   tiZANidine  (ZANAFLEX ) 4 MG tablet Take 1 tablet (4 mg total) by mouth 2 (two) times daily as needed for muscle spasms.   No facility-administered encounter medications on file as of 01/19/2024.    History: Past Medical History:  Diagnosis Date   Anxiety    Bronchitis    GERD (gastroesophageal reflux disease)    Hypertension    Insomnia    Low testosterone     Osteoarthritis    Status post total knee replacement 07/09/2021   Past Surgical History:  Procedure Laterality Date   COLONOSCOPY WITH PROPOFOL  N/A 01/24/2021   Procedure: COLONOSCOPY WITH PROPOFOL ;  Surgeon: Unk Corinn Skiff, MD;  Location: [facility] END[facility]COPY;  Service: Gastroenterology;  Laterality: N/A;   NASAL SINUS SURGERY     Dr. Gretta   TOTAL KNEE ARTHROPLASTY Left 07/09/2021   Procedure: LEFT TOTAL KNEE ARTHROPLASTY;  Surgeon: Jerri Kay HERO, MD;  Location: [facility] OR;  Service: Orthopedics;  Laterality: Left;   Unremarkable     WRIST SURGERY Right    Family History  Problem Relation Age of Onset   Lung cancer Father        deceased   Parkinson's disease Mother    Social History   Occupational History   Not on file  Tobacco Use   Smoking status: Former    Current packs/day: 0.00    Types: Cigarettes    Quit date: 2010    Years since quitting: 16.0   Smokeless tobacco: Never   Tobacco  comments:    smoked age [age] stopped then age 36 x 1 years max 1/2 ppd   Vaping Use   Vaping status: Never Used  Substance and Sexual Activity   Alcohol use: No    Alcohol/week: 0.0 standard drinks of alcohol   Drug use: No   Sexual activity: Yes   Tobacco Counseling Counseling given: Not Answered Tobacco comments: smoked age [age] stopped then age 7 x 1 years max 1/2 ppd   SDOH Screenings   Food Insecurity: No Food Insecurity (01/19/2024)  Housing: Low Risk (01/19/2024)  Transportation Needs: No Transportation Needs (01/19/2024)  Utilities: Not At Risk (01/19/2024)  Alcohol Screen: Low Risk (01/17/2023)  Depression (PHQ2-9): Low Risk (01/19/2024)  Financial Resource Strain: Low Risk (01/19/2024)  Physical Activity: Inactive (01/19/2024)  Social Connections: Moderately Integrated (01/19/2024)  Stress: No Stress Concern Present (01/19/2024)  Tobacco Use: Medium Risk (01/19/2024)  Health Literacy: Adequate Health Literacy (01/19/2024)   See flowsheets for full screening details  Depression Screen PHQ 2 & 9 Depression Scale- Over the past 2 weeks, how often have you been bothered by any of the following problems? Little interest or pleasure in doing things: 0 Feeling down, depressed, or hopeless (PHQ Adolescent also includes...irritable): 0 PHQ-2 Total Score: 0 Trouble falling or staying asleep,  or sleeping too much: 0 Feeling tired or having little energy: 1 Poor appetite or overeating (PHQ Adolescent also includes...weight loss): 0 Feeling bad about yourself - or that you are a failure or have let yourself or your family down: 0 Trouble concentrating on things, such as reading the newspaper or watching television (PHQ Adolescent also includes...like school work): 0 Moving or speaking so slowly that other people could have noticed. Or the opposite - being so fidgety or restless that you have been moving around a lot more than usual: 0 Thoughts that you would be better off dead, or of  hurting yourself in some way: 0 PHQ-9 Total Score: 1 If you checked off any problems, how difficult have these problems made it for you to do your work, take care of things at home, or get along with other people?: Somewhat difficult     Goals Addressed             This Visit's Progress    Patient Stated       Wants to work on a healthy diet             Objective:    Today's Vitals   01/19/24 0930  Weight: 180 lb (81.6 kg)  Height: 5' 11 (1.803 m)   Body mass index is 25.1 kg/m.  Hearing/Vision screen Hearing Screening - Comments:: No issues Vision Screening - Comments:: Glasses, [employer], up to date Immunizations and Health Maintenance Health Maintenance  Topic Date Due   Hepatitis B Vaccines 19-59 Average Risk (3 of 3 - 19+ 3-dose series) 02/08/2015   Pneumococcal Vaccine: 50+ Years (1 of 1 - PCV) Never done   Zoster Vaccines- Shingrix (1 of 2) Never done   Colonoscopy  01/25/2024   Influenza Vaccine  04/06/2024 (Originally 08/08/2023)   Medicare Annual Wellness (AWV)  01/18/2025   DTaP/Tdap/Td (3 - Td or Tdap) 11/25/2033   Hepatitis C Screening  Completed   HIV Screening  Completed   HPV VACCINES  Aged Out   Meningococcal B Vaccine  Aged Out   COVID-19 Vaccine  Discontinued        Assessment/Plan:  This is a routine wellness examination for Gregory Stokes.  Patient Care Team: Gretel App, NP as PCP - General (Nurse Practitioner) Selma Cough, MD as Consulting Physician (Urology) Gregory Edsel Ruth, [place] (Neurosurgery) Unk Corinn Skiff, MD as Consulting Physician (Gastroenterology)  I have personally reviewed and noted the following in the patients chart:   Medical and social history Use of alcohol, tobacco or illicit drugs  Current medications and supplements including opioid prescriptions. Functional ability and status Nutritional status Physical activity Advanced directives List of other physicians Hospitalizations, surgeries, and ER visits in  previous 12 months Vitals Screenings to include cognitive, depression, and falls Referrals and appointments  No orders of the defined types were placed in this encounter.  In addition, I have reviewed and discussed with patient certain preventive protocols, quality metrics, and best practice recommendations. A written personalized care plan for preventive services as well as general preventive health recommendations were provided to patient.   Gregory Fredericks, LPN   8/87/7973   Return in 1 year (on 01/18/2025).  After Visit Summary: (MyChart) Due to this being a telephonic visit, the after visit summary with patients personalized plan was offered to patient via MyChart   Nurse Notes: Patient declines flu and hepatitis vaccines at this time. Patient stated that he will consider getting the pneumonia and shingles vaccines.  Patient will contact his  GI doctor at [facility] to schedule a colonoscopy.  "

## 2024-01-19 NOTE — Patient Instructions (Signed)
 [name],  Thank you for taking the time for your Medicare Wellness Visit. I appreciate your continued commitment to your health goals. Please review the care plan we discussed, and feel free to reach out if I can assist you further.  Please note that Annual Wellness Visits do not include a physical exam. Some assessments may be limited, especially if the visit was conducted virtually. If needed, we may recommend an in-person follow-up with your provider.  Ongoing Care Seeing your primary care provider every 3 to 6 months helps us  monitor your health and provide consistent, personalized care.   Remember to call [facility] and schedule a colonoscopy. Consider updating your pneumonia and shingles vaccines.   Referrals If a referral was made during today's visit and you haven't received any updates within two weeks, please contact the referred provider directly to check on the status.  Recommended Screenings:  Health Maintenance  Topic Date Due   Hepatitis B Vaccine (3 of 3 - 19+ 3-dose series) 02/08/2015   Pneumococcal Vaccine for age over 45 (1 of 1 - PCV) Never done   Zoster (Shingles) Vaccine (1 of 2) Never done   Colon Cancer Screening  01/25/2024   Flu Shot  04/06/2024*   Medicare Annual Wellness Visit  01/18/2025   DTaP/Tdap/Td vaccine (3 - Td or Tdap) 11/25/2033   Hepatitis C Screening  Completed   HIV Screening  Completed   HPV Vaccine  Aged Out   Meningitis B Vaccine  Aged Out   COVID-19 Vaccine  Discontinued  *Topic was postponed. The date shown is not the original due date.       01/19/2024    9:35 AM  Advanced Directives  Does Patient Have a Medical Advance Directive? No  Would patient like information on creating a medical advance directive? No - Patient declined    Vision: Annual vision screenings are recommended for early detection of glaucoma, cataracts, and diabetic retinopathy. These exams can also reveal signs of chronic conditions such as diabetes and  high blood pressure.  Dental: Annual dental screenings help detect early signs of oral cancer, gum disease, and other conditions linked to overall health, including heart disease and diabetes.  Please see the attached documents for additional preventive care recommendations.

## 2024-01-26 ENCOUNTER — Encounter: Payer: Self-pay | Admitting: Nurse Practitioner

## 2024-01-26 DIAGNOSIS — Z1211 Encounter for screening for malignant neoplasm of colon: Secondary | ICD-10-CM

## 2024-01-29 ENCOUNTER — Telehealth: Payer: Self-pay

## 2024-01-29 ENCOUNTER — Other Ambulatory Visit: Payer: Self-pay

## 2024-01-29 DIAGNOSIS — Z860100 Personal history of colon polyps, unspecified: Secondary | ICD-10-CM

## 2024-01-29 MED ORDER — PEG 3350-KCL-NA BICARB-NACL 420 G PO SOLR: 4000.0000 mL | ORAL | 0 refills | Status: AC

## 2024-01-29 NOTE — Telephone Encounter (Signed)
 Gastroenterology Pre-Procedure Review  Request Date: 04/30/24 Requesting Physician: Dr. theophilus  PATIENT REVIEW QUESTIONS: The patient responded to the following health history questions as indicated:    1. Are you having any GI issues? no 2. Do you have a personal history of Polyps? yes (last colonoscopy performed by Dr. Unk 01/24/21 she recommended repeat in 3 years with 2 day prep) 3. Do you have a family history of Colon Cancer or Polyps? no 4. Diabetes Mellitus? no 5. Joint replacements in the past 12 months?no 6. Major health problems in the past 3 months?no 7. Any artificial heart valves, MVP, or defibrillator?no    MEDICATIONS & ALLERGIES:    Patient reports the following regarding taking any anticoagulation/antiplatelet therapy:   Plavix, Coumadin, Eliquis, Xarelto, Lovenox, Pradaxa, Brilinta, or Effient? no Aspirin ? yes (81 mg daily)  Patient confirms/reports the following medications:  Current Outpatient Medications  Medication Sig Dispense Refill   albuterol  (PROVENTIL  HFA;VENTOLIN  HFA) 108 (90 Base) MCG/ACT inhaler Inhale 1-2 puffs into the lungs every 6 (six) hours as needed for wheezing or shortness of breath. 1 Inhaler 1   ALPRAZolam  (XANAX ) 0.5 MG tablet TAKE 1 TABLET(0.5 MG) BY MOUTH THREE TIMES DAILY AS NEEDED FOR ANXIETY. 90 tablet 5   amoxicillin  (AMOXIL ) 500 MG capsule Take 4 pills one hour prior to dental work 8 capsule 2   aspirin  81 MG chewable tablet Chew 81 mg by mouth daily.     Avanafil (STENDRA) 200 MG TABS TAKE 1/2 TABLET BY MOUTH AS DIRECTED     gabapentin  (NEURONTIN ) 400 MG capsule Take 2 capsules (800 mg total) by mouth 3 (three) times daily. 540 capsule 1   lisinopril  (ZESTRIL ) 5 MG tablet Take 1 tablet (5 mg total) by mouth daily.     Multiple Vitamin (MULTIVITAMIN) tablet Take 1 tablet by mouth daily.     omeprazole  (PRILOSEC) 40 MG capsule Take 1 capsule (40 mg total) by mouth daily. 150 capsule 1   testosterone  cypionate (DEPOTESTOSTERONE  CYPIONATE) 200 MG/ML injection Inject 160 mg into the skin every 14 (fourteen) days.     tiZANidine  (ZANAFLEX ) 4 MG tablet Take 1 tablet (4 mg total) by mouth 2 (two) times daily as needed for muscle spasms. 60 tablet 5   No current facility-administered medications for this visit.    Patient confirms/reports the following allergies:  Allergies[1]  No orders of the defined types were placed in this encounter.   AUTHORIZATION INFORMATION Primary Insurance: 1D#: Group #:  Secondary Insurance: 1D#: Group #:  SCHEDULE INFORMATION: Date: 04/30/24 Time: Location: [facility]    [1]  Allergies Allergen Reactions   Tessalon  Perles [Benzonatate ]     drowsy

## 2024-01-31 ENCOUNTER — Other Ambulatory Visit: Payer: Self-pay | Admitting: Family

## 2024-01-31 DIAGNOSIS — M5116 Intervertebral disc disorders with radiculopathy, lumbar region: Secondary | ICD-10-CM

## 2024-02-06 ENCOUNTER — Other Ambulatory Visit: Payer: Self-pay | Admitting: Nurse Practitioner

## 2024-02-06 DIAGNOSIS — G8929 Other chronic pain: Secondary | ICD-10-CM

## 2024-02-09 ENCOUNTER — Encounter: Payer: Self-pay | Admitting: Nurse Practitioner

## 2024-02-10 NOTE — Telephone Encounter (Signed)
 Noted.

## 2024-02-19 ENCOUNTER — Ambulatory Visit: Admitting: Nurse Practitioner

## 2024-03-18 ENCOUNTER — Ambulatory Visit: Admitting: Nurse Practitioner

## 2024-04-30 ENCOUNTER — Ambulatory Visit: Admit: 2024-04-30

## 2024-04-30 SURGERY — COLONOSCOPY
Anesthesia: General

## 2025-01-24 ENCOUNTER — Ambulatory Visit
